# Patient Record
Sex: Female | Born: 1960
Health system: Southern US, Community
[De-identification: ages and names within clinical notes are randomized; demographics above are authoritative.]

## PROBLEM LIST (undated history)

## (undated) DIAGNOSIS — D354 Benign neoplasm of pineal gland: Secondary | ICD-10-CM

## (undated) DIAGNOSIS — I10 Essential (primary) hypertension: Secondary | ICD-10-CM

## (undated) DIAGNOSIS — J189 Pneumonia, unspecified organism: Secondary | ICD-10-CM

## (undated) DIAGNOSIS — N938 Other specified abnormal uterine and vaginal bleeding: Secondary | ICD-10-CM

## (undated) DIAGNOSIS — Z01419 Encounter for gynecological examination (general) (routine) without abnormal findings: Secondary | ICD-10-CM

## (undated) DIAGNOSIS — K219 Gastro-esophageal reflux disease without esophagitis: Secondary | ICD-10-CM

## (undated) DIAGNOSIS — Z9289 Personal history of other medical treatment: Secondary | ICD-10-CM

## (undated) HISTORY — PX: WISDOM TOOTH EXTRACTION: SHX21

## (undated) HISTORY — PX: TONSILLECTOMY: SUR1361

## (undated) HISTORY — PX: MYOMECTOMY ABDOMINAL APPROACH: SUR870

## (undated) HISTORY — DX: Pneumonia, unspecified organism: J18.9

## (undated) HISTORY — PX: EUSTACHIAN TUBE DILATION: SHX6770

## (undated) HISTORY — DX: Essential (primary) hypertension: I10

## (undated) HISTORY — DX: Gastro-esophageal reflux disease without esophagitis: K21.9

## (undated) HISTORY — PX: DILATION AND CURETTAGE OF UTERUS: SHX78

## (undated) HISTORY — PX: ABDOMINAL HYSTERECTOMY: SHX81

## (undated) HISTORY — DX: Other specified abnormal uterine and vaginal bleeding: N93.8

## (undated) HISTORY — PX: ANTERIOR CRUCIATE LIGAMENT REPAIR: SHX115

## (undated) HISTORY — DX: Benign neoplasm of pineal gland: D35.4

## (undated) HISTORY — DX: Encounter for gynecological examination (general) (routine) without abnormal findings: Z01.419

## (undated) HISTORY — DX: Personal history of other medical treatment: Z92.89

---

## 1997-08-08 ENCOUNTER — Other Ambulatory Visit: Admission: RE | Admit: 1997-08-08 | Discharge: 1997-08-08 | Payer: Self-pay | Admitting: Obstetrics and Gynecology

## 1998-08-09 ENCOUNTER — Other Ambulatory Visit: Admission: RE | Admit: 1998-08-09 | Discharge: 1998-08-09 | Payer: Self-pay | Admitting: Obstetrics and Gynecology

## 1998-09-07 ENCOUNTER — Ambulatory Visit (HOSPITAL_COMMUNITY): Admission: RE | Admit: 1998-09-07 | Discharge: 1998-09-07 | Payer: Self-pay | Admitting: Obstetrics and Gynecology

## 1998-09-07 ENCOUNTER — Encounter: Payer: Self-pay | Admitting: Obstetrics and Gynecology

## 1998-09-13 ENCOUNTER — Encounter: Payer: Self-pay | Admitting: Obstetrics and Gynecology

## 1998-09-13 ENCOUNTER — Ambulatory Visit (HOSPITAL_COMMUNITY): Admission: RE | Admit: 1998-09-13 | Discharge: 1998-09-13 | Payer: Self-pay | Admitting: Obstetrics and Gynecology

## 1999-08-12 ENCOUNTER — Other Ambulatory Visit: Admission: RE | Admit: 1999-08-12 | Discharge: 1999-08-12 | Payer: Self-pay | Admitting: Obstetrics and Gynecology

## 1999-09-13 ENCOUNTER — Ambulatory Visit (HOSPITAL_COMMUNITY): Admission: RE | Admit: 1999-09-13 | Discharge: 1999-09-13 | Payer: Self-pay | Admitting: Obstetrics and Gynecology

## 1999-09-13 ENCOUNTER — Encounter: Payer: Self-pay | Admitting: Obstetrics and Gynecology

## 2000-01-06 ENCOUNTER — Ambulatory Visit (HOSPITAL_COMMUNITY): Admission: RE | Admit: 2000-01-06 | Discharge: 2000-01-06 | Payer: Self-pay | Admitting: Gastroenterology

## 2000-01-06 ENCOUNTER — Encounter: Payer: Self-pay | Admitting: Gastroenterology

## 2000-10-05 ENCOUNTER — Ambulatory Visit (HOSPITAL_COMMUNITY): Admission: RE | Admit: 2000-10-05 | Discharge: 2000-10-05 | Payer: Self-pay | Admitting: Obstetrics and Gynecology

## 2000-10-05 ENCOUNTER — Encounter: Payer: Self-pay | Admitting: Obstetrics and Gynecology

## 2000-10-12 ENCOUNTER — Other Ambulatory Visit: Admission: RE | Admit: 2000-10-12 | Discharge: 2000-10-12 | Payer: Self-pay | Admitting: Obstetrics and Gynecology

## 2001-09-14 ENCOUNTER — Other Ambulatory Visit: Admission: RE | Admit: 2001-09-14 | Discharge: 2001-09-14 | Payer: Self-pay | Admitting: Obstetrics and Gynecology

## 2001-10-19 ENCOUNTER — Emergency Department (HOSPITAL_COMMUNITY): Admission: EM | Admit: 2001-10-19 | Discharge: 2001-10-20 | Payer: Self-pay | Admitting: *Deleted

## 2001-10-20 ENCOUNTER — Encounter: Payer: Self-pay | Admitting: Emergency Medicine

## 2001-10-26 ENCOUNTER — Encounter: Payer: Self-pay | Admitting: Family Medicine

## 2001-10-26 ENCOUNTER — Encounter: Admission: RE | Admit: 2001-10-26 | Discharge: 2001-10-26 | Payer: Self-pay | Admitting: Family Medicine

## 2002-03-10 ENCOUNTER — Encounter: Payer: Self-pay | Admitting: Family Medicine

## 2002-08-09 ENCOUNTER — Encounter: Payer: Self-pay | Admitting: Obstetrics and Gynecology

## 2002-08-09 ENCOUNTER — Ambulatory Visit (HOSPITAL_COMMUNITY): Admission: RE | Admit: 2002-08-09 | Discharge: 2002-08-09 | Payer: Self-pay | Admitting: Obstetrics and Gynecology

## 2002-09-29 ENCOUNTER — Other Ambulatory Visit: Admission: RE | Admit: 2002-09-29 | Discharge: 2002-09-29 | Payer: Self-pay | Admitting: Obstetrics and Gynecology

## 2002-10-31 ENCOUNTER — Encounter: Payer: Self-pay | Admitting: Family Medicine

## 2002-10-31 ENCOUNTER — Encounter: Admission: RE | Admit: 2002-10-31 | Discharge: 2002-10-31 | Payer: Self-pay | Admitting: Family Medicine

## 2003-08-29 ENCOUNTER — Ambulatory Visit (HOSPITAL_COMMUNITY): Admission: RE | Admit: 2003-08-29 | Discharge: 2003-08-29 | Payer: Self-pay | Admitting: Obstetrics and Gynecology

## 2003-12-07 ENCOUNTER — Other Ambulatory Visit: Admission: RE | Admit: 2003-12-07 | Discharge: 2003-12-07 | Payer: Self-pay | Admitting: Obstetrics and Gynecology

## 2004-09-06 ENCOUNTER — Ambulatory Visit: Payer: Self-pay | Admitting: Family Medicine

## 2004-09-13 ENCOUNTER — Ambulatory Visit: Payer: Self-pay | Admitting: Family Medicine

## 2004-09-19 ENCOUNTER — Encounter: Admission: RE | Admit: 2004-09-19 | Discharge: 2004-09-19 | Payer: Self-pay | Admitting: Family Medicine

## 2004-10-30 ENCOUNTER — Ambulatory Visit (HOSPITAL_COMMUNITY): Admission: RE | Admit: 2004-10-30 | Discharge: 2004-10-30 | Payer: Self-pay | Admitting: Obstetrics and Gynecology

## 2004-12-23 ENCOUNTER — Other Ambulatory Visit: Admission: RE | Admit: 2004-12-23 | Discharge: 2004-12-23 | Payer: Self-pay | Admitting: Obstetrics and Gynecology

## 2005-01-29 ENCOUNTER — Encounter (INDEPENDENT_AMBULATORY_CARE_PROVIDER_SITE_OTHER): Payer: Self-pay | Admitting: Specialist

## 2005-01-29 ENCOUNTER — Ambulatory Visit (HOSPITAL_COMMUNITY): Admission: RE | Admit: 2005-01-29 | Discharge: 2005-01-29 | Payer: Self-pay | Admitting: Obstetrics and Gynecology

## 2005-06-17 ENCOUNTER — Ambulatory Visit: Payer: Self-pay | Admitting: Internal Medicine

## 2005-09-12 ENCOUNTER — Ambulatory Visit: Payer: Self-pay | Admitting: Family Medicine

## 2005-09-18 ENCOUNTER — Ambulatory Visit: Payer: Self-pay | Admitting: Family Medicine

## 2005-11-18 ENCOUNTER — Ambulatory Visit (HOSPITAL_COMMUNITY): Admission: RE | Admit: 2005-11-18 | Discharge: 2005-11-18 | Payer: Self-pay | Admitting: Obstetrics and Gynecology

## 2005-12-23 ENCOUNTER — Other Ambulatory Visit: Admission: RE | Admit: 2005-12-23 | Discharge: 2005-12-23 | Payer: Self-pay | Admitting: Obstetrics and Gynecology

## 2006-01-22 ENCOUNTER — Encounter (INDEPENDENT_AMBULATORY_CARE_PROVIDER_SITE_OTHER): Payer: Self-pay | Admitting: *Deleted

## 2006-01-23 ENCOUNTER — Inpatient Hospital Stay (HOSPITAL_COMMUNITY): Admission: RE | Admit: 2006-01-23 | Discharge: 2006-01-24 | Payer: Self-pay | Admitting: Obstetrics and Gynecology

## 2006-03-10 ENCOUNTER — Ambulatory Visit: Payer: Self-pay | Admitting: Family Medicine

## 2006-03-13 ENCOUNTER — Ambulatory Visit: Payer: Self-pay | Admitting: Family Medicine

## 2006-05-27 ENCOUNTER — Ambulatory Visit: Payer: Self-pay | Admitting: Family Medicine

## 2006-10-02 ENCOUNTER — Ambulatory Visit: Payer: Self-pay | Admitting: Family Medicine

## 2006-10-02 LAB — CONVERTED CEMR LAB
Bilirubin Urine: NEGATIVE
Nitrite: NEGATIVE
Protein, U semiquant: NEGATIVE
Urobilinogen, UA: NEGATIVE
WBC Urine, dipstick: NEGATIVE

## 2006-10-08 DIAGNOSIS — Z8709 Personal history of other diseases of the respiratory system: Secondary | ICD-10-CM | POA: Insufficient documentation

## 2006-10-08 DIAGNOSIS — K219 Gastro-esophageal reflux disease without esophagitis: Secondary | ICD-10-CM | POA: Insufficient documentation

## 2006-10-12 ENCOUNTER — Ambulatory Visit: Payer: Self-pay | Admitting: Family Medicine

## 2006-10-12 DIAGNOSIS — I10 Essential (primary) hypertension: Secondary | ICD-10-CM | POA: Insufficient documentation

## 2006-10-12 DIAGNOSIS — J309 Allergic rhinitis, unspecified: Secondary | ICD-10-CM | POA: Insufficient documentation

## 2006-10-12 LAB — CONVERTED CEMR LAB
AST: 18 units/L (ref 0–37)
Albumin: 3.6 g/dL (ref 3.5–5.2)
Alkaline Phosphatase: 63 units/L (ref 39–117)
Basophils Absolute: 0 10*3/uL (ref 0.0–0.1)
Basophils Relative: 0.3 % (ref 0.0–1.0)
CO2: 29 meq/L (ref 19–32)
Chloride: 101 meq/L (ref 96–112)
Creatinine, Ser: 0.7 mg/dL (ref 0.4–1.2)
HCT: 40.4 % (ref 36.0–46.0)
Hemoglobin: 13.8 g/dL (ref 12.0–15.0)
LDL Cholesterol: 104 mg/dL — ABNORMAL HIGH (ref 0–99)
MCHC: 34.2 g/dL (ref 30.0–36.0)
Monocytes Absolute: 0.6 10*3/uL (ref 0.2–0.7)
Neutrophils Relative %: 70.5 % (ref 43.0–77.0)
Potassium: 3.8 meq/L (ref 3.5–5.1)
RBC: 4.52 M/uL (ref 3.87–5.11)
RDW: 13 % (ref 11.5–14.6)
Sodium: 138 meq/L (ref 135–145)
TSH: 0.92 microintl units/mL (ref 0.35–5.50)
Total Bilirubin: 1.6 mg/dL — ABNORMAL HIGH (ref 0.3–1.2)
Total CHOL/HDL Ratio: 3.5
Total Protein: 6.9 g/dL (ref 6.0–8.3)
VLDL: 17 mg/dL (ref 0–40)
WBC: 8.9 10*3/uL (ref 4.5–10.5)

## 2006-11-20 ENCOUNTER — Encounter: Payer: Self-pay | Admitting: Family Medicine

## 2006-11-20 ENCOUNTER — Telehealth: Payer: Self-pay | Admitting: Family Medicine

## 2006-11-23 ENCOUNTER — Ambulatory Visit (HOSPITAL_COMMUNITY): Admission: RE | Admit: 2006-11-23 | Discharge: 2006-11-23 | Payer: Self-pay | Admitting: Obstetrics and Gynecology

## 2006-12-28 ENCOUNTER — Other Ambulatory Visit: Admission: RE | Admit: 2006-12-28 | Discharge: 2006-12-28 | Payer: Self-pay | Admitting: Obstetrics and Gynecology

## 2007-09-07 ENCOUNTER — Telehealth: Payer: Self-pay | Admitting: Family Medicine

## 2007-09-10 ENCOUNTER — Telehealth: Payer: Self-pay | Admitting: Family Medicine

## 2007-10-05 ENCOUNTER — Ambulatory Visit: Payer: Self-pay | Admitting: Family Medicine

## 2007-10-05 LAB — CONVERTED CEMR LAB
Blood in Urine, dipstick: NEGATIVE
Nitrite: NEGATIVE
Protein, U semiquant: NEGATIVE
WBC Urine, dipstick: NEGATIVE

## 2007-10-08 LAB — CONVERTED CEMR LAB
ALT: 15 units/L (ref 0–35)
AST: 17 units/L (ref 0–37)
Albumin: 3.4 g/dL — ABNORMAL LOW (ref 3.5–5.2)
Alkaline Phosphatase: 48 units/L (ref 39–117)
BUN: 10 mg/dL (ref 6–23)
Basophils Absolute: 0 10*3/uL (ref 0.0–0.1)
Basophils Relative: 0.1 % (ref 0.0–3.0)
Bilirubin, Direct: 0.1 mg/dL (ref 0.0–0.3)
CO2: 31 meq/L (ref 19–32)
Calcium: 8.9 mg/dL (ref 8.4–10.5)
Chloride: 108 meq/L (ref 96–112)
Cholesterol: 147 mg/dL (ref 0–200)
Creatinine, Ser: 0.7 mg/dL (ref 0.4–1.2)
Eosinophils Absolute: 0.2 10*3/uL (ref 0.0–0.7)
Eosinophils Relative: 2.9 % (ref 0.0–5.0)
GFR calc Af Amer: 117 mL/min
GFR calc non Af Amer: 97 mL/min
Glucose, Bld: 94 mg/dL (ref 70–99)
HCT: 40.7 % (ref 36.0–46.0)
HDL: 43.7 mg/dL (ref >39.0)
Hemoglobin: 13.8 g/dL (ref 12.0–15.0)
LDL Cholesterol: 84 mg/dL (ref 0–99)
Lymphocytes Relative: 25.7 % (ref 12.0–46.0)
MCHC: 33.9 g/dL (ref 30.0–36.0)
MCV: 92.3 fL (ref 78.0–100.0)
Monocytes Absolute: 0.5 10*3/uL (ref 0.1–1.0)
Monocytes Relative: 7.4 % (ref 3.0–12.0)
Neutro Abs: 4.4 10*3/uL (ref 1.4–7.7)
Neutrophils Relative %: 63.9 % (ref 43.0–77.0)
Platelets: 281 10*3/uL (ref 150–400)
Potassium: 4.1 meq/L (ref 3.5–5.1)
RBC: 4.4 M/uL (ref 3.87–5.11)
RDW: 12.3 % (ref 11.5–14.6)
Sodium: 143 meq/L (ref 135–145)
TSH: 0.89 microintl units/mL (ref 0.35–5.50)
Total Bilirubin: 0.9 mg/dL (ref 0.3–1.2)
Total CHOL/HDL Ratio: 3.4
Total Protein: 6.3 g/dL (ref 6.0–8.3)
Triglycerides: 96 mg/dL (ref 0–149)
VLDL: 19 mg/dL (ref 0–40)
WBC: 6.9 10*3/uL (ref 4.5–10.5)

## 2007-10-13 ENCOUNTER — Ambulatory Visit: Payer: Self-pay | Admitting: Family Medicine

## 2007-10-20 ENCOUNTER — Telehealth: Payer: Self-pay | Admitting: Family Medicine

## 2007-11-26 ENCOUNTER — Ambulatory Visit (HOSPITAL_COMMUNITY): Admission: RE | Admit: 2007-11-26 | Discharge: 2007-11-26 | Payer: Self-pay | Admitting: Gynecology

## 2007-12-17 ENCOUNTER — Telehealth: Payer: Self-pay | Admitting: Family Medicine

## 2008-01-05 ENCOUNTER — Encounter: Payer: Self-pay | Admitting: Gynecology

## 2008-01-05 ENCOUNTER — Other Ambulatory Visit: Admission: RE | Admit: 2008-01-05 | Discharge: 2008-01-05 | Payer: Self-pay | Admitting: Gynecology

## 2008-01-05 ENCOUNTER — Ambulatory Visit: Payer: Self-pay | Admitting: Gynecology

## 2008-01-22 ENCOUNTER — Ambulatory Visit: Payer: Self-pay | Admitting: Family Medicine

## 2008-01-22 DIAGNOSIS — J45909 Unspecified asthma, uncomplicated: Secondary | ICD-10-CM | POA: Insufficient documentation

## 2008-03-09 ENCOUNTER — Telehealth: Payer: Self-pay | Admitting: Family Medicine

## 2008-08-21 ENCOUNTER — Encounter: Payer: Self-pay | Admitting: Family Medicine

## 2008-09-18 ENCOUNTER — Encounter: Payer: Self-pay | Admitting: Family Medicine

## 2008-10-06 ENCOUNTER — Ambulatory Visit: Payer: Self-pay | Admitting: Family Medicine

## 2008-10-06 LAB — CONVERTED CEMR LAB
Ketones, urine, test strip: NEGATIVE
Nitrite: NEGATIVE
Urobilinogen, UA: 0.2
WBC Urine, dipstick: NEGATIVE

## 2008-10-09 LAB — CONVERTED CEMR LAB
ALT: 17 units/L (ref 0–35)
Alkaline Phosphatase: 60 units/L (ref 39–117)
Basophils Relative: 0.3 % (ref 0.0–3.0)
Bilirubin, Direct: 0 mg/dL (ref 0.0–0.3)
Calcium: 8.9 mg/dL (ref 8.4–10.5)
Chloride: 112 meq/L (ref 96–112)
Creatinine, Ser: 0.8 mg/dL (ref 0.4–1.2)
Eosinophils Relative: 2.6 % (ref 0.0–5.0)
HDL: 42.4 mg/dL (ref >39.00)
LDL Cholesterol: 100 mg/dL — ABNORMAL HIGH (ref 0–99)
Lymphocytes Relative: 26.8 % (ref 12.0–46.0)
Neutrophils Relative %: 61.7 % (ref 43.0–77.0)
RBC: 4.53 M/uL (ref 3.87–5.11)
Total CHOL/HDL Ratio: 4
Total Protein: 6.4 g/dL (ref 6.0–8.3)
Triglycerides: 66 mg/dL (ref 0.0–149.0)
WBC: 6.7 10*3/uL (ref 4.5–10.5)

## 2008-10-13 ENCOUNTER — Telehealth: Payer: Self-pay | Admitting: Family Medicine

## 2008-10-27 ENCOUNTER — Ambulatory Visit: Payer: Self-pay | Admitting: Family Medicine

## 2008-12-14 ENCOUNTER — Telehealth: Payer: Self-pay | Admitting: Family Medicine

## 2008-12-15 ENCOUNTER — Ambulatory Visit (HOSPITAL_COMMUNITY): Admission: RE | Admit: 2008-12-15 | Discharge: 2008-12-15 | Payer: Self-pay | Admitting: Gynecology

## 2009-01-08 ENCOUNTER — Ambulatory Visit: Payer: Self-pay | Admitting: Gynecology

## 2009-01-08 ENCOUNTER — Other Ambulatory Visit: Admission: RE | Admit: 2009-01-08 | Discharge: 2009-01-08 | Payer: Self-pay | Admitting: Gynecology

## 2009-02-08 ENCOUNTER — Encounter: Payer: Self-pay | Admitting: Family Medicine

## 2009-03-26 ENCOUNTER — Telehealth: Payer: Self-pay | Admitting: Family Medicine

## 2009-05-07 ENCOUNTER — Encounter: Payer: Self-pay | Admitting: Family Medicine

## 2009-06-18 ENCOUNTER — Ambulatory Visit: Payer: Self-pay | Admitting: Family Medicine

## 2009-07-05 ENCOUNTER — Ambulatory Visit: Payer: Self-pay | Admitting: Internal Medicine

## 2009-07-05 DIAGNOSIS — R509 Fever, unspecified: Secondary | ICD-10-CM | POA: Insufficient documentation

## 2009-07-05 DIAGNOSIS — J029 Acute pharyngitis, unspecified: Secondary | ICD-10-CM | POA: Insufficient documentation

## 2009-07-06 ENCOUNTER — Telehealth: Payer: Self-pay | Admitting: Internal Medicine

## 2009-07-13 ENCOUNTER — Ambulatory Visit: Payer: Self-pay | Admitting: Family Medicine

## 2009-07-13 DIAGNOSIS — K5289 Other specified noninfective gastroenteritis and colitis: Secondary | ICD-10-CM | POA: Insufficient documentation

## 2009-09-24 ENCOUNTER — Telehealth: Payer: Self-pay | Admitting: Family Medicine

## 2009-09-24 DIAGNOSIS — D354 Benign neoplasm of pineal gland: Secondary | ICD-10-CM | POA: Insufficient documentation

## 2009-10-10 ENCOUNTER — Encounter: Admission: RE | Admit: 2009-10-10 | Discharge: 2009-10-10 | Payer: Self-pay | Admitting: Family Medicine

## 2009-11-01 ENCOUNTER — Ambulatory Visit: Payer: Self-pay | Admitting: Family Medicine

## 2009-11-01 LAB — CONVERTED CEMR LAB
Blood in Urine, dipstick: NEGATIVE
Nitrite: NEGATIVE
Protein, U semiquant: NEGATIVE
WBC Urine, dipstick: NEGATIVE
pH: 5.5

## 2009-11-05 LAB — CONVERTED CEMR LAB
ALT: 16 units/L (ref 0–35)
Albumin: 3.5 g/dL (ref 3.5–5.2)
Basophils Relative: 0.9 % (ref 0.0–3.0)
Calcium: 9.2 mg/dL (ref 8.4–10.5)
Creatinine, Ser: 0.7 mg/dL (ref 0.4–1.2)
Eosinophils Absolute: 0.2 10*3/uL (ref 0.0–0.7)
Eosinophils Relative: 2.6 % (ref 0.0–5.0)
GFR calc non Af Amer: 90.8 mL/min (ref >60)
Glucose, Bld: 87 mg/dL (ref 70–99)
HDL: 50.9 mg/dL (ref >39.00)
Hemoglobin: 12.6 g/dL (ref 12.0–15.0)
Lymphs Abs: 1.7 10*3/uL (ref 0.7–4.0)
Monocytes Absolute: 0.5 10*3/uL (ref 0.1–1.0)
Platelets: 255 10*3/uL (ref 150.0–400.0)
RDW: 13.5 % (ref 11.5–14.6)
Sodium: 141 meq/L (ref 135–145)
TSH: 1 microintl units/mL (ref 0.35–5.50)
Total Protein: 6.1 g/dL (ref 6.0–8.3)
VLDL: 20.8 mg/dL (ref 0.0–40.0)

## 2009-11-08 ENCOUNTER — Ambulatory Visit: Payer: Self-pay | Admitting: Family Medicine

## 2009-11-24 ENCOUNTER — Ambulatory Visit: Payer: Self-pay | Admitting: Internal Medicine

## 2010-01-01 ENCOUNTER — Ambulatory Visit (HOSPITAL_COMMUNITY)
Admission: RE | Admit: 2010-01-01 | Discharge: 2010-01-01 | Payer: Self-pay | Source: Home / Self Care | Admitting: Gynecology

## 2010-01-10 ENCOUNTER — Ambulatory Visit: Payer: Self-pay | Admitting: Gynecology

## 2010-01-10 ENCOUNTER — Other Ambulatory Visit
Admission: RE | Admit: 2010-01-10 | Discharge: 2010-01-10 | Payer: Self-pay | Source: Home / Self Care | Admitting: Gynecology

## 2010-02-11 ENCOUNTER — Encounter: Payer: Self-pay | Admitting: Family Medicine

## 2010-02-15 ENCOUNTER — Telehealth: Payer: Self-pay | Admitting: Family Medicine

## 2010-02-26 NOTE — Progress Notes (Signed)
  Phone Note Call from Patient   Caller: Patient Call For: Nelwyn Salisbury MD Summary of Call: Has questions about her ongoing illness.  We discussed drinking electrolyte drinks, monitoring fever, possibilty of using Immodium if diarrhea does not subside.  Slowly increase diet.  She is feeling some better, but is still having diarrhea today and a temp of 100.  She is aware of warning [phone] Initial call taken by: Lynann Beaver CMA,  July 06, 2009 1:05 PM  Follow-up for Phone Call        agree with above . if diarrhea continueing would consider stool culture tests.  Follow-up by: Madelin Headings MD,  July 06, 2009 1:20 PM

## 2010-02-26 NOTE — Progress Notes (Signed)
Summary: wants test  Phone Note Call from Patient Call back at [phone]   Caller: Patient Call For: Nelwyn Salisbury MD Summary of Call: has CPX scheduled in October- says it's time for her 8yr f/u MRI brain and wants to get this done before her CPX. Initial call taken by: Raechel Ache, RN,  September 24, 2009 1:43 PM  Follow-up for Phone Call        I agree. 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

## 2010-02-26 NOTE — Assessment & Plan Note (Signed)
Summary: continued GI problems/dm   Vital Signs:  Patient profile:   50 year old female Weight:      169 pounds BMI:     27.38 Temp:     98.4 degrees F oral BP sitting:   136 / 90  (left arm) Cuff size:   regular  Vitals Entered By: Raechel Ache, RN (July 13, 2009 4:29 PM) CC: Saw Dr Fabian Sharp last week for ?food poisoning and still having diarrhea and cramps. Also dry cough. Leaving for vacation tomorrow.   History of Present Illness: Here for 2 weeks of continuing problems of upper abdominal cramps and diarrhea. This started with fever and nausea as well, but these subsided after several days. No recent international travel. No recent antibiotic use. No one else in her family is sick. She was seen here on 07-05-09, and this was felt to be a viral gastroenteritis. The symptoms did improve a bit for a few days, but now the cramps and diarrhea have been back for about a week. She is able to eat light foods and drink fluids. Imodium helps at times.   Allergies: 1)  ! Sulfa 2)  ! Compazine 3)  ! Macrobid  Past History:  Past Medical History: Reviewed history from 10/27/2008 and no changes required. GERD Pneumonia, hx of Allergic rhinitis, sees Dr. Corinda Gubler benign pineal gland cyst, last MRI 09-19-04 (5 yr follow up planned) Hypertension Dysfunctional uterine bleeding, sees Dr. Lily Peer Asthma sees Dr. Karlyn Agee for skin checks  Past Surgical History: Reviewed history from 10/27/2008 and no changes required. Tonsillectomy D and C twice Wisdom Teeth extraction Hysterectomy right knee ACL reconstruction with medial and lateral meniscectomies 09-04-08 per Dr. Teryl Lucy  Review of Systems  The patient denies anorexia, fever, weight loss, weight gain, vision loss, decreased hearing, hoarseness, chest pain, syncope, dyspnea on exertion, peripheral edema, prolonged cough, headaches, hemoptysis, melena, hematochezia, severe indigestion/heartburn, hematuria, incontinence, genital  sores, muscle weakness, suspicious skin lesions, transient blindness, difficulty walking, depression, unusual weight change, abnormal bleeding, enlarged lymph nodes, angioedema, breast masses, and testicular masses.    Physical Exam  General:  Well-developed,well-nourished,in no acute distress; alert,appropriate and cooperative throughout examination Eyes:  no jaundice Neck:  No deformities, masses, or tenderness noted. Lungs:  Normal respiratory effort, chest expands symmetrically. Lungs are clear to auscultation, no crackles or wheezes. Heart:  Normal rate and regular rhythm. S1 and S2 normal without gallop, murmur, click, rub or other extra sounds. Abdomen:  soft, normal bowel sounds, no distention, no masses, no guarding, no rigidity, no rebound tenderness, no abdominal hernia, no inguinal hernia, no hepatomegaly, and no splenomegaly.  Mild tenderness in both upper quadrants.    Impression & Recommendations:  Problem # 1:  GASTROENTERITIS (ICD-558.9)  Complete Medication List: 1)  Nexium 40 Mg Cpdr (Esomeprazole magnesium) .... Once daily 2)  Zyrtec Allergy 10 Mg Tabs (Cetirizine hcl) 3)  Flonase 50 Mcg/act Susp (Fluticasone propionate) .... 2 sprays once daily each nostril 4)  Toprol Xl 25 Mg Tb24 (Metoprolol succinate) .... Once daily 5)  K-tabs 10 Meq Tbcr (Potassium chloride) .... Once daily 6)  Advil 200 Mg Caps (Ibuprofen) .... Once daily as needed 7)  Ciprofloxacin Hcl 500 Mg Tabs (Ciprofloxacin hcl) .... Two times a day  Patient Instructions: 1)  She leaves in the morning for a [place] at the beach, so she does not want to start any sort of a workup for this today. We decided to cover for any bacterial infection she may be  harboring with a course of Cipro. She can use Imodium as needed . If she is not better by the time she gets back from vacation, she will see me and we will work this up in more detail.  Prescriptions: CIPROFLOXACIN HCL 500 MG TABS (CIPROFLOXACIN HCL) two times  a day  #20 x 0   Entered and Authorized by:   Nelwyn Salisbury MD   Signed by:   Nelwyn Salisbury MD on 07/13/2009   Method used:   Electronically to        [facility]  [phone]* (retail)       [address]       [place], [place]  [address]       Ph: [phone] or [phone]       Fax: [phone]   RxID:   [id number]

## 2010-02-26 NOTE — Progress Notes (Signed)
Summary: [facility]  Phone Note From Pharmacy   Caller: [facility] Call For: Clent Ridges  Summary of Call: have a script for Toprol dated 10/13/07- want to know if it can be renewed? ph [phone] JYN#[phone] Initial call taken by: Raechel Ache, RN,  March 26, 2009 4:21 PM  Follow-up for Phone Call        Phone call completed, Pharmacist called Follow-up by: Alfred Levins, CMA,  March 26, 2009 4:27 PM    Prescriptions: TOPROL XL 25 MG  TB24 (METOPROLOL SUCCINATE) once daily  #90 x 3   Entered by:   Alfred Levins, CMA   Authorized by:   Nelwyn Salisbury MD   Signed by:   Alfred Levins, CMA on 03/26/2009   Method used:   Electronically to        [facility]* (mail-order)             ,          Ph: [phone]       Fax: [phone]   RxID:   [id number]

## 2010-02-26 NOTE — Letter (Signed)
Summary: [facility]  [facility]   Imported By: [name] 05/11/2009 13:22:22  _____________________________________________________________________  External Attachment:    Type:   Image     Comment:   External Document

## 2010-02-26 NOTE — Assessment & Plan Note (Signed)
Summary: CPX // RS   Vital Signs:  Patient profile:   50 year old female Height:      66 inches Weight:      177 pounds O2 Sat:      95 % Temp:     98.6 degrees F Pulse rate:   63 / minute BP sitting:   130 / 82  (left arm)  Vitals Entered By: Pura Spice, RN (November 08, 2009 10:22 AM)  Contraindications/Deferment of Procedures/Staging:    Test/Procedure: FLU VAX    Reason for deferment: patient declined  CC: cpx no pap Is Patient Diabetic? No   History of Present Illness: 50 yr old female for a cpx. She feels fine with no concerns.   Allergies: 1)  ! Sulfa 2)  ! Compazine 3)  ! Macrobid  Past History:  Past Medical History: Reviewed history from 10/27/2008 and no changes required. GERD Pneumonia, hx of Allergic rhinitis, sees Dr. Corinda Gubler benign pineal gland cyst, last MRI 09-19-04 (5 yr follow up planned) Hypertension Dysfunctional uterine bleeding, sees Dr. Lily Peer Asthma sees Dr. Karlyn Agee for skin checks  Past Surgical History: Reviewed history from 10/27/2008 and no changes required. Tonsillectomy D and C twice Wisdom Teeth extraction Hysterectomy right knee ACL reconstruction with medial and lateral meniscectomies 09-04-08 per Dr. Teryl Lucy  Past History:  Care Management: Gynecology: Dr Mickel Duhamel  Family History: Reviewed history from 10/08/2006 and no changes required. Family History of Bowel disease Family History Diabetes 1st degree relative Family History Hypertension Family History Thyroid disease Family History of Cardiovascular disorder  Social History: Reviewed history from 10/08/2006 and no changes required. Occupation: Married Never Smoked Alcohol use-yes  Review of Systems  The patient denies anorexia, fever, weight loss, weight gain, vision loss, decreased hearing, hoarseness, chest pain, syncope, dyspnea on exertion, peripheral edema, prolonged cough, headaches, hemoptysis, abdominal pain, melena, hematochezia,  severe indigestion/heartburn, hematuria, incontinence, genital sores, muscle weakness, suspicious skin lesions, transient blindness, difficulty walking, depression, unusual weight change, abnormal bleeding, enlarged lymph nodes, angioedema, breast masses, and testicular masses.    Physical Exam  General:  Well-developed,well-nourished,in no acute distress; alert,appropriate and cooperative throughout examination Head:  Normocephalic and atraumatic without obvious abnormalities. No apparent alopecia or balding. Eyes:  No corneal or conjunctival inflammation noted. EOMI. Perrla. Funduscopic exam benign, without hemorrhages, exudates or papilledema. Vision grossly normal. Ears:  External ear exam shows no significant lesions or deformities.  Otoscopic examination reveals clear canals, tympanic membranes are intact bilaterally without bulging, retraction, inflammation or discharge. Hearing is grossly normal bilaterally. Nose:  External nasal examination shows no deformity or inflammation. Nasal mucosa are pink and moist without lesions or exudates. Mouth:  Oral mucosa and oropharynx without lesions or exudates.  Teeth in good repair. Neck:  No deformities, masses, or tenderness noted. Chest Wall:  No deformities, masses, or tenderness noted. Lungs:  Normal respiratory effort, chest expands symmetrically. Lungs are clear to auscultation, no crackles or wheezes. Heart:  Normal rate and regular rhythm. S1 and S2 normal without gallop, murmur, click, rub or other extra sounds. Abdomen:  Bowel sounds positive,abdomen soft and non-tender without masses, organomegaly or hernias noted. Msk:  No deformity or scoliosis noted of thoracic or lumbar spine.   Pulses:  R and L carotid,radial,femoral,dorsalis pedis and posterior tibial pulses are full and equal bilaterally Extremities:  No clubbing, cyanosis, edema, or deformity noted with normal full range of motion of all joints.   Neurologic:  No cranial nerve  deficits noted. Station and  gait are normal. Plantar reflexes are down-going bilaterally. DTRs are symmetrical throughout. Sensory, motor and coordinative functions appear intact. Skin:  Intact without suspicious lesions or rashes Cervical Nodes:  No lymphadenopathy noted Axillary Nodes:  No palpable lymphadenopathy Inguinal Nodes:  No significant adenopathy Psych:  Cognition and judgment appear intact. Alert and cooperative with normal attention span and concentration. No apparent delusions, illusions, hallucinations   Impression & Recommendations:  Problem # 1:  EXAMINATION, ROUTINE MEDICAL (ICD-V70.0)  Complete Medication List: 1)  Nexium 40 Mg Cpdr (Esomeprazole magnesium) .... Once daily 2)  Zyrtec Allergy 10 Mg Tabs (Cetirizine hcl) 3)  Flonase 50 Mcg/act Susp (Fluticasone propionate) .... 2 sprays once daily each nostril 4)  Toprol Xl 25 Mg Tb24 (Metoprolol succinate) .... Once daily 5)  K-tabs 10 Meq Tbcr (Potassium chloride) .... Once daily 6)  Advil 200 Mg Caps (Ibuprofen) .... Once daily as needed  Patient Instructions: 1)  Please schedule a follow-up appointment in 6 months .  Prescriptions: K-TABS 10 MEQ  TBCR (POTASSIUM CHLORIDE) once daily  #90 x 3   Entered and Authorized by:   Nelwyn Salisbury MD   Signed by:   Nelwyn Salisbury MD on 11/08/2009   Method used:   Print then Give to Patient   RxID:   [id number] TOPROL XL 25 MG  TB24 (METOPROLOL SUCCINATE) once daily  #90 x 3   Entered and Authorized by:   Nelwyn Salisbury MD   Signed by:   Nelwyn Salisbury MD on 11/08/2009   Method used:   Print then Give to Patient   RxID:   [id number] FLONASE 50 MCG/ACT  SUSP (FLUTICASONE PROPIONATE) 2 sprays once daily each nostril  #90 x 3   Entered and Authorized by:   Nelwyn Salisbury MD   Signed by:   Nelwyn Salisbury MD on 11/08/2009   Method used:   Print then Give to Patient   RxID:   [id number] NEXIUM 40 MG  CPDR (ESOMEPRAZOLE MAGNESIUM) once daily  #90 x 3   Entered  and Authorized by:   Nelwyn Salisbury MD   Signed by:   Nelwyn Salisbury MD on 11/08/2009   Method used:   Print then Give to Patient   RxID:   [id number]

## 2010-02-26 NOTE — Assessment & Plan Note (Signed)
Summary: ALLERGIES...AS.   Vital Signs:  Patient profile:   50 year old female Height:      66 inches Weight:      181 pounds O2 Sat:      98 % Temp:     98.2 degrees F oral Pulse rate:   60 / minute BP sitting:   132 / 90  (left arm) Cuff size:   regular  Vitals Entered By: Jarome Lamas (November 24, 2009 12:38 PM) CC: congestion/pb   History of Present Illness: At a conference this week, something triggered an acute allergy attack Got acute ear symptoms upon landing in plane  watery eyes, runny nose Has post nasal drip  Tried sudafed before flying but didn't help  some clear and now thick mucus this AM No fever or at most low grade  No sig cough No SOB or wheezing  using her zyrtec and flonase regularly on weekly immunotherapy  Current Medications (verified): 1)  Nexium 40 Mg  Cpdr (Esomeprazole Magnesium) .... Once Daily 2)  Zyrtec Allergy 10 Mg  Tabs (Cetirizine Hcl) 3)  Flonase 50 Mcg/act  Susp (Fluticasone Propionate) .... 2 Sprays Once Daily Each Nostril 4)  Toprol Xl 25 Mg  Tb24 (Metoprolol Succinate) .... Once Daily 5)  K-Tabs 10 Meq  Tbcr (Potassium Chloride) .... Once Daily 6)  Advil 200 Mg Caps (Ibuprofen) .... Once Daily As Needed  Allergies (verified): 1)  ! Sulfa 2)  ! Compazine 3)  ! Macrobid  Past History:  Past medical, surgical, family and social histories (including risk factors) reviewed for relevance to current acute and chronic problems.  Past Medical History: Reviewed history from 10/27/2008 and no changes required. GERD Pneumonia, hx of Allergic rhinitis, sees Dr. Corinda Gubler benign pineal gland cyst, last MRI 09-19-04 (5 yr follow up planned) Hypertension Dysfunctional uterine bleeding, sees Dr. Lily Peer Asthma sees Dr. Karlyn Agee for skin checks  Past Surgical History: Reviewed history from 10/27/2008 and no changes required. Tonsillectomy D and C twice Wisdom Teeth extraction Hysterectomy right knee ACL reconstruction with  medial and lateral meniscectomies 09-04-08 per Dr. Teryl Lucy  Family History: Reviewed history from 10/08/2006 and no changes required. Family History of Bowel disease Family History Diabetes 1st degree relative Family History Hypertension Family History Thyroid disease Family History of Cardiovascular disorder  Social History: Reviewed history from 10/08/2006 and no changes required. Occupation: Married Never Smoked Alcohol use-yes  Review of Systems       No nausea or diarrhea  Physical Exam  General:  alert.  NAD Head:  no sig maxillary or frontal tenderness but does feel maxillary pressure Ears:  TMs both retracted without inflammation Nose:  moderate swelling and inflammation Mouth:  no erythema and no exudates.   Neck:  supple, no masses, and no cervical lymphadenopathy.   Lungs:  normal respiratory effort, no intercostal retractions, no accessory muscle use, normal breath sounds, no crackles, and no wheezes.     Impression & Recommendations:  Problem # 1:  ALLERGIC RHINITIS (ICD-477.9) Assessment Deteriorated exposure at conference, then ear problems with flying tends to wind up with sinustis if not cleared  P: will try 5 days of prednisone  Her updated medication list for this problem includes:    Zyrtec Allergy 10 Mg Tabs (Cetirizine hcl)    Flonase 50 Mcg/act Susp (Fluticasone propionate) .Marland Kitchen... 2 sprays once daily each nostril  Complete Medication List: 1)  Nexium 40 Mg Cpdr (Esomeprazole magnesium) .... Once daily 2)  Zyrtec Allergy 10 Mg Tabs (  Cetirizine hcl) 3)  Flonase 50 Mcg/act Susp (Fluticasone propionate) .... 2 sprays once daily each nostril 4)  Toprol Xl 25 Mg Tb24 (Metoprolol succinate) .... Once daily 5)  K-tabs 10 Meq Tbcr (Potassium chloride) .... Once daily 6)  Advil 200 Mg Caps (Ibuprofen) .... Once daily as needed 7)  Prednisone 20 Mg Tabs (Prednisone) .... 2 tabs daily for 5 days for allergy congestion  Patient Instructions: 1)   Please take the prednisone as prescribed for the next 5 days 2)  You may also add loratadine 10mg   1-2 tabs daily to your regular zyrtec for the allergies 3)  Please schedule a follow-up appointment as needed .  Prescriptions: PREDNISONE 20 MG TABS (PREDNISONE) 2 tabs daily for 5 days for allergy congestion  #10 x 0   Entered and Authorized by:   Cindee Salt MD   Signed by:   Cindee Salt MD on 11/24/2009   Method used:   Electronically to        [facility] (retail)       [address]       [place], [place]  [address]       Ph: [phone]       Fax: [phone]   RxID:   [id number]    Orders Added: 1)  Est. Patient Level III [84132]

## 2010-02-26 NOTE — Assessment & Plan Note (Signed)
Summary: mylagias, fever up to 103, diarrhea/dm/onset last night   Vital Signs:  Patient profile:   50 year old female Weight:      175 pounds Temp:     101.3 degrees F oral Pulse rate:   88 / minute BP sitting:   150 / 80  (left arm) Cuff size:   regular  Vitals Entered By: Romualdo Bolk, CMA (AAMA) (July 05, 2009 10:53 AM) CC: Fever, body aches, some coughing, glands swollen, diarrhea, sinus pressure, vomiting that started on 6/8   History of Present Illness: [facility] comes in today  with sudden onset of   fever myalgias  and above.  vomited x 1  .   diarrhea this am watery no blood .  No rash   by mouth fluids ok.  Mild cough some mild ha no rash no tick bites . No uti signs   . No vomiting today and by mouth intake adequate .   Just traveled from meeting in [place].     No cp sob or wheezing . Had flu shot this year.   Preventive Screening-Counseling & Management  Alcohol-Tobacco     Smoking Status: never  Current Medications (verified): 1)  Nexium 40 Mg  Cpdr (Esomeprazole Magnesium) .... Once Daily 2)  Zyrtec Allergy 10 Mg  Tabs (Cetirizine Hcl) 3)  Flonase 50 Mcg/act  Susp (Fluticasone Propionate) .... 2 Sprays Once Daily Each Nostril 4)  Toprol Xl 25 Mg  Tb24 (Metoprolol Succinate) .... Once Daily 5)  K-Tabs 10 Meq  Tbcr (Potassium Chloride) .... Once Daily 6)  Advil 200 Mg Caps (Ibuprofen) .... Once Daily As Needed  Allergies (verified): 1)  ! Sulfa 2)  ! Compazine 3)  ! Macrobid  Past History:  Past medical, surgical, family and social histories (including risk factors) reviewed, and no changes noted (except as noted below).  Past Medical History: Reviewed history from 10/27/2008 and no changes required. GERD Pneumonia, hx of Allergic rhinitis, sees Dr. Corinda Gubler benign pineal gland cyst, last MRI 09-19-04 (5 yr follow up planned) Hypertension Dysfunctional uterine bleeding, sees Dr. Lily Peer Asthma sees Dr. Karlyn Agee for skin  checks  Past Surgical History: Reviewed history from 10/27/2008 and no changes required. Tonsillectomy D and C twice Wisdom Teeth extraction Hysterectomy right knee ACL reconstruction with medial and lateral meniscectomies 09-04-08 per Dr. Teryl Lucy  Family History: Reviewed history from 10/08/2006 and no changes required. Family History of Bowel disease Family History Diabetes 1st degree relative Family History Hypertension Family History Thyroid disease Family History of Cardiovascular disorder  Social History: Reviewed history from 10/08/2006 and no changes required. Occupation: Married Never Smoked Alcohol use-yes  Review of Systems       The patient complains of anorexia and fever.  The patient denies vision loss, decreased hearing, hoarseness, chest pain, syncope, dyspnea on exertion, peripheral edema, headaches, hemoptysis, abdominal pain, melena, hematochezia, severe indigestion/heartburn, hematuria, difficulty walking, abnormal bleeding, and angioedema.         minor raspy cough / allergy  Physical Exam  General:  mld mod ill in nad  Head:  normocephalic and atraumatic.   Eyes:  vision grossly intact, pupils equal, and pupils round.   Ears:  R ear normal, L ear normal, and no external deformities.   Nose:  no external deformity and no nasal discharge.   Mouth:  red 1+ no lesions  no edema Neck:  tender ac area no  masses supple.   Lungs:  Normal respiratory effort, chest  expands symmetrically. Lungs are clear to auscultation, no crackles or wheezes.no dullness.   Heart:  Normal rate and regular rhythm. S1 and S2 normal without gallop, murmur, click, rub or other extra sounds.no lifts.   Abdomen:  Bowel sounds positive,abdomen soft and non-tender without masses, organomegaly or   noted. Pulses:  nl cap refill  Extremities:  no clubbing cyanosis or edema  Neurologic:  nonf ocal  Skin:  turgor normal, color normal, no ecchymoses, no petechiae, and no purpura.    Cervical Nodes:  tender ac  Axillary Nodes:  No palpable lymphadenopathy Psych:  Oriented X3, normally interactive, and good eye contact.     Impression & Recommendations:  Problem # 1:  FEVER UNSPECIFIED (ICD-780.60) flu like  illness   almose but has sore throat   check strep test.     low risk for tick related disease.  non focal exam otherwise  Orders: Rapid Strep ([address])  Problem # 2:  SORE THROAT (ICD-462) see above  Her updated medication list for this problem includes:    Advil 200 Mg Caps (Ibuprofen) ..... Once daily as needed  Orders: Rapid Strep ([address])  Complete Medication List: 1)  Nexium 40 Mg Cpdr (Esomeprazole magnesium) .... Once daily 2)  Zyrtec Allergy 10 Mg Tabs (Cetirizine hcl) 3)  Flonase 50 Mcg/act Susp (Fluticasone propionate) .... 2 sprays once daily each nostril 4)  Toprol Xl 25 Mg Tb24 (Metoprolol succinate) .... Once daily 5)  K-tabs 10 Meq Tbcr (Potassium chloride) .... Once daily 6)  Advil 200 Mg Caps (Ibuprofen) .... Once daily as needed  Patient Instructions: 1)  observe and recheck  if fever  not better in 48-72 hours  or  rash or as needed . probably viral illness .   2)  signs rx as tolerated.    no work until better   Laboratory Results  Date/Time Received: July 05, 2009 11:45 AM  Date/Time Reported: July 05, 2009 11:45 AM   Other Tests  Rapid Strep: negative Comments Wynona Canes, CMA  July 05, 2009 11:45 AM

## 2010-02-26 NOTE — Letter (Signed)
Summary: [facility]  [facility]   Imported By: [name] 02/14/2009 10:58:54  _____________________________________________________________________  External Attachment:    Type:   Image     Comment:   External Document

## 2010-02-26 NOTE — Assessment & Plan Note (Signed)
Summary: tetanus shot   Nurse Visit   Allergies: 1)  ! Sulfa 2)  ! Compazine 3)  ! Macrobid  Immunizations Administered:  Tetanus Vaccine:    Vaccine Type: Td    Site: right deltoid    Mfr: Sanofi Pasteur    Dose: 0.5 ml    Route: IM    Given by: Raechel Ache, RN    Exp. Date: 02/22/2011    Lot #: [id number]    VIS given: 12/15/06 version given Jun 18, 2009.  Orders Added: 1)  TD Toxoids IM 7 YR + [90714] 2)  Admin 1st Vaccine [65784]

## 2010-02-27 ENCOUNTER — Other Ambulatory Visit: Payer: Self-pay | Admitting: *Deleted

## 2010-02-27 NOTE — Telephone Encounter (Signed)
Please call pt regarding her Nexium refill at [facility].

## 2010-02-28 NOTE — Letter (Signed)
Summary: RE: use of flex spending account   RE: use of flex spending account   Imported By: [name] 02/15/2010 09:43:05  _____________________________________________________________________  External Attachment:    Type:   Image     Comment:   External Document

## 2010-02-28 NOTE — Progress Notes (Signed)
Summary: rx toprol  xl to [facility]  Phone Note Call from Patient   Caller: Patient Summary of Call: changed insu providers now has [facility] and wants rx toprolol xl  Initial call taken by: Pura Spice, RN,  February 15, 2010 1:27 PM  Follow-up for Phone Call        done  faxed to [facility]  Follow-up by: Pura Spice, RN,  February 15, 2010 1:27 PM    New/Updated Medications: TOPROL XL 25 MG  TB24 (METOPROLOL SUCCINATE) once daily Prescriptions: TOPROL XL 25 MG  TB24 (METOPROLOL SUCCINATE) once daily  #90 x 3   Entered by:   Pura Spice, RN   Authorized by:   Nelwyn Salisbury MD   Signed by:   Pura Spice, RN on 02/15/2010   Method used:   Faxed to ...       [facility] (mail[facility]order)       [address] [place], [place]  [address]       Ph: [phone]       Fax: [phone]   RxID:   [id number]

## 2010-03-15 ENCOUNTER — Telehealth: Payer: Self-pay | Admitting: Family Medicine

## 2010-03-15 DIAGNOSIS — K219 Gastro-esophageal reflux disease without esophagitis: Secondary | ICD-10-CM

## 2010-03-15 MED ORDER — ESOMEPRAZOLE MAGNESIUM 40 MG PO CPDR: 40.0000 mg | DELAYED_RELEASE_CAPSULE | Freq: Every day | ORAL | Status: DC

## 2010-03-15 NOTE — Telephone Encounter (Signed)
Triage vm------please send a new rx for Nexium to [facility] [address]. Having trouble with her mail order. Will be out of  meds on Monday.

## 2010-03-15 NOTE — Telephone Encounter (Signed)
Ok sent to [facility] to [facility] [address]  Pt aware.

## 2010-04-06 ENCOUNTER — Ambulatory Visit (INDEPENDENT_AMBULATORY_CARE_PROVIDER_SITE_OTHER): Payer: BC Managed Care – PPO | Admitting: Family Medicine

## 2010-04-06 ENCOUNTER — Encounter: Payer: Self-pay | Admitting: Family Medicine

## 2010-04-06 DIAGNOSIS — J069 Acute upper respiratory infection, unspecified: Secondary | ICD-10-CM | POA: Insufficient documentation

## 2010-04-06 DIAGNOSIS — J45901 Unspecified asthma with (acute) exacerbation: Secondary | ICD-10-CM

## 2010-04-09 NOTE — Assessment & Plan Note (Signed)
Summary: congestion/rcd   Vital Signs:  Patient profile:   50 year old female Height:      66 inches (167.64 cm) Weight:      172 pounds (78.18 kg) BMI:     27.86 O2 Sat:      96 % on Room air Temp:     99.1 degrees F (37.28 degrees C) oral Pulse rate:   76 / minute Pulse rhythm:   regular BP sitting:   128 / 82  (left arm) Cuff size:   regular  Vitals Entered By: Brenton Grills CMA Duncan Dull) (April 06, 2010 10:08 AM)  O2 Flow:  Room air CC: ?URI infection (cough, congestion, wheezing)/aj Comments Pt has been taking clarinex for sxs   History of Present Illness: 50 year old female:  Starting on Wed, had some asthma, and a lot of allergy in general: sees allergist and gets routine allergy shots. Husband has been sick on for about eight days.  Thursday nights, has been having a lot more trouble. Coughing has been a lot more  a productive coughing.   A lot of sinus congestion, some ear pain.   Sneezing a lot right now.   Now wheezing. Has been to the ER for wheezing a year ago at the last time, and a couple of years. Requiring nebs, but no inpatient admission. Has required steroids on multiple occaisions.    Current Medications (verified): 1)  Nexium 40 Mg  Cpdr (Esomeprazole Magnesium) .... Once Daily 2)  Zyrtec Allergy 10 Mg  Tabs (Cetirizine Hcl) 3)  Flonase 50 Mcg/act  Susp (Fluticasone Propionate) .... 2 Sprays Once Daily Each Nostril 4)  Toprol Xl 25 Mg  Tb24 (Metoprolol Succinate) .... Once Daily 5)  K-Tabs 10 Meq  Tbcr (Potassium Chloride) .... Once Daily 6)  Advil 200 Mg Caps (Ibuprofen) .... Once Daily As Needed 7)  Prednisone 20 Mg Tabs (Prednisone) .... 2 Tabs Daily For 5 Days For Allergy Congestion  Allergies (verified): 1)  ! Sulfa 2)  ! Compazine 3)  ! Macrobid  Past History:  Past medical, surgical, family and social histories (including risk factors) reviewed, and no changes noted (except as noted below).  Past Medical History: Reviewed history  from 10/27/2008 and no changes required. GERD Pneumonia, hx of Allergic rhinitis, sees Dr. Corinda Gubler benign pineal gland cyst, last MRI 09-19-04 (5 yr follow up planned) Hypertension Dysfunctional uterine bleeding, sees Dr. Lily Peer Asthma sees Dr. Karlyn Agee for skin checks  Past Surgical History: Reviewed history from 10/27/2008 and no changes required. Tonsillectomy D and C twice Wisdom Teeth extraction Hysterectomy right knee ACL reconstruction with medial and lateral meniscectomies 09-04-08 per Dr. Teryl Lucy  Family History: Reviewed history from 10/08/2006 and no changes required. Family History of Bowel disease Family History Diabetes 1st degree relative Family History Hypertension Family History Thyroid disease Family History of Cardiovascular disorder  Social History: Reviewed history from 10/08/2006 and no changes required. Occupation: Married Never Smoked Alcohol use-yes  Review of Systems       REVIEW OF SYSTEMS GEN: Acute illness details above. CV: No chest pain PULM: as above GI: No noted N or V Otherwise, pertinent positives and negatives are noted in the HPI.   Physical Exam  General:  Well-developed,well-nourished,in no acute distress; alert,appropriate and cooperative throughout examination Head:  Normocephalic and atraumatic without obvious abnormalities. No apparent alopecia or balding. Ears:  External ear exam shows no significant lesions or deformities.  Otoscopic examination reveals clear canals, tympanic membranes are intact bilaterally -  SHOWS SEROUS EFFUSION Mouth:  Oral mucosa and oropharynx without lesions or exudates.  Teeth in good repair. Neck:  No deformities, masses, or tenderness noted. Lungs:  normal respiratory effort, no intercostal retractions, and no accessory muscle use.  decreased BS diffusely, but no crackles or wheezing Heart:  Normal rate and regular rhythm. S1 and S2 normal without gallop, murmur, click, rub or other extra  sounds. Extremities:  No clubbing, cyanosis, edema, or deformity noted with normal full range of motion of all joints.   Neurologic:  alert & oriented X3 and gait normal.   Cervical Nodes:  No lymphadenopathy noted Psych:  Cognition and judgment appear intact. Alert and cooperative with normal attention span and concentration. No apparent delusions, illusions, hallucinations   Impression & Recommendations:  Problem # 1:  ASTHMA, WITH ACUTE EXACERBATION (ICD-493.92) Assessment New URI induced asthma exacerbation suspect viral etiology   The following medications were removed from the medication list:    Prednisone 20 Mg Tabs (Prednisone) .Marland Kitchen... 2 tabs daily for 5 days for allergy congestion Her updated medication list for this problem includes:    Xopenex Hfa 45 Mcg/act Aero (Levalbuterol tartrate) .Marland Kitchen... 2 puffs q 4 hours as needed wheezing or cough    Prednisone 20 Mg Tabs (Prednisone) .Marland Kitchen... 2 tabs by mouth daily for 5 days, then 1 by mouth x 3 days  Problem # 2:  URI (ICD-465.9) Assessment: New  Her updated medication list for this problem includes:    Zyrtec Allergy 10 Mg Tabs (Cetirizine hcl)    Advil 200 Mg Caps (Ibuprofen) ..... Once daily as needed  Complete Medication List: 1)  Nexium 40 Mg Cpdr (Esomeprazole magnesium) .... Once daily 2)  Zyrtec Allergy 10 Mg Tabs (Cetirizine hcl) 3)  Flonase 50 Mcg/act Susp (Fluticasone propionate) .... 2 sprays once daily each nostril 4)  Toprol Xl 25 Mg Tb24 (Metoprolol succinate) .... Once daily 5)  K-tabs 10 Meq Tbcr (Potassium chloride) .... Once daily 6)  Advil 200 Mg Caps (Ibuprofen) .... Once daily as needed 7)  Xopenex Hfa 45 Mcg/act Aero (Levalbuterol tartrate) .... 2 puffs q 4 hours as needed wheezing or cough 8)  Prednisone 20 Mg Tabs (Prednisone) .... 2 tabs by mouth daily for 5 days, then 1 by mouth x 3 days Prescriptions: PREDNISONE 20 MG TABS (PREDNISONE) 2 tabs by mouth daily for 5 days, then 1 by mouth x 3 days  #13 x  0   Entered and Authorized by:   Hannah Beat MD   Signed by:   Hannah Beat MD on 04/06/2010   Method used:   Electronically to        [facility] (retail)       [address]       [place], [place]  [address]       Ph: [phone]       Fax: [phone]   RxID:   [id number] XOPENEX HFA 45 MCG/ACT AERO (LEVALBUTEROL TARTRATE) 2 puffs q 4 hours as needed wheezing or cough  #1 x 0   Entered and Authorized by:   Hannah Beat MD   Signed by:   Hannah Beat MD on 04/06/2010   Method used:   Print then Give to Patient   RxID:   [id number]    Orders Added: 1)  Est. Patient Level IV [75643]

## 2010-06-14 NOTE — Discharge Summary (Signed)
NAMEKATRISHA, Olivia Carr NO.:  [id number]   MEDICAL RECORD NO.:  [id number]          PATIENT TYPE:  INP   LOCATION:  9309                          [facility]ACILITY:  [facility]   PHYSICIAN:  Rudy Jew. Ashley Royalty, M.D.DATE OF BIRTH:  02-21-1960   DATE OF ADMISSION:  01/22/2006  DATE OF DISCHARGE:  01/24/2006                               DISCHARGE SUMMARY   DISCHARGE DIAGNOSES:  1. Multiple leiomyomas of the uterus.  2. Abnormal uterine bleeding - refractory to medical management.  3. Asthma.  4. Gastroesophageal reflux disease.  5. Hypertension.   OPERATIONS AND PROCEDURES:  Laparoscopically-assisted vaginal  hysterectomy.   CONSULTATIONS:  None.   DISCHARGE MEDICATIONS:  1. Percocet.  2. Motrin 600 mg.  3. Iron.   HISTORY AND PHYSICAL:  This is a 45-year gravida 3, para 2, AB 1 who  presented recently complaining of abnormal uterine bleeding.  She took  oral contraceptives without improvement.  She stated her bleeding was  quite debilitating and requested surgical intervention.  She has a known  fibroid uterus and the last ultrasound was January 08, 2006, which  revealed the uterus to be approximately 11 x 7 cm.  There were several  fibroids present, the largest of which was 3.2 cm in greatest diameter.   For the remainder of the history and physical, please see chart.   HOSPITAL COURSE:  The patient was admitted to [facility].  Admission laboratory studies were drawn.  On January 22, 2006, she was taken to the operating room and underwent laparoscopically-  assisted vaginal hysterectomy.  Procedure was accomplished by Dr. Sylvester Harder.  The patient's postoperative course was completely benign.  She was discharged on the second postoperative day afebrile and in  satisfactory condition.   DISPOSITION:  The patient is to return to [facility] in approximately 6 weeks for postoperative evaluation.     James A.  Ashley Royalty, M.D.  Electronically Signed    JAM/MEDQ  D:  02/11/2006  T:  02/11/2006  Job:  [id number]

## 2010-06-14 NOTE — Op Note (Signed)
Olivia Carr, DOUDS           ACCOUNT NO.:  [id number]   MEDICAL RECORD NO.:  [id number]          PATIENT TYPE:  AMB   LOCATION:  SDC                           [facility]   PHYSICIAN:  James A. Ashley Royalty, M.D.DATE OF BIRTH:  [birth year]   DATE OF PROCEDURE:  01/29/2005  DATE OF DISCHARGE:                                 OPERATIVE REPORT   PREOPERATIVE DIAGNOSES:  1.  Intrauterine polyp versus fibroid.  2.  Abnormal uterine bleeding.   POSTOPERATIVE DIAGNOSIS:  Intrauterine fibroid.  Pathology pending.   PROCEDURES:  1.  Diagnosis/operative hysteroscopy.  2.  Myomectomy.  3.  Dilatation and curettage.   SURGEON:  Rudy Jew. Ashley Royalty, M.D.   ANESTHESIA:  General.   ESTIMATED BLOOD LOSS:  50 mL.   COMPLICATIONS:  None.   PACKS AND DRAINS:  None.   FLUID DEFICIT:  Approximately 480 mL.   PROCEDURE:  The patient was taken to the operating room and placed in the  dorsal supine position.  After general anesthesia was administered, she was  placed in the lithotomy position and prepped and draped the usual manner for  vaginal surgery.  A posterior weighted retractor was placed per vagina.  The  anterior lip of the cervix was grasped with a single-tooth tenaculum.  The  uterus was gently sounded to approximately 8 cm.  The cervix was then  serially dilated to a size 29-French using Shawnie Pons dilators.  The resectoscope  was placed using sorbitol as a distension medium.  The maximum pressure was  100.  The intrauterine cavity was visualized.  The left tubal ostium was  easily visualized.  In the uterine cavity there was a large submucosal  fibroid noted with a component that was also intramural.  The right tubal  ostium was visualized with some difficulty.  Using the cutting waveform, the  intrauterine component of the fibroid was shaved and removed in a piecemeal  fashion at approximately 50 watts cutting waveform.  Careful attention was  paid to avoid the cornual area due to  its thinness.  As much of the  submucosal component of the fibroid was removed as possible in order to  avoid the risk of perforation.  All the fragments were removed from the  uterus and submitted to pathology for histologic studies.  Hemostasis was  obtained with the coagulation waveform.  Appropriate photographs were  obtained before and after.   Attention was then turned to the uterine curettage.  A medium-sized curette  was introduced into the uterine cavity.  A four-quadrant technique of  curettage was initially employed.  Then a therapeutic technique was  employed.  All curettings were sent to pathology separately for histologic  studies.   The resectoscope was once again placed.  Small bleeders were addressed with  the coagulation waveform until it was felt to be prudent to cease the  procedure.  At this point the vaginal instruments were removed.  Hemostasis  was noted.  The procedure was terminated.   The patient was taken to the recovery room in excellent condition.      James A. Ashley Royalty, M.D.  Electronically  Signed     JAM/MEDQ  D:  01/29/2005  T:  01/29/2005  Job:  [id number]

## 2010-06-14 NOTE — Assessment & Plan Note (Signed)
[facility] OFFICE NOTE   NAME:Olivia Carr, Olivia Carr                  MRN:          [id number]  DATE:09/18/2005                            DOB:          10/21/1960    This is a 50 year old woman here for a non-gynecological physical  examination.  In general, she is doing well but does have one complaint to  mention over the last few summer months.  She has had some mild swelling  around her feet and ankles that is mildly uncomfortable.  She would like a  p.r.n. medication that she can use for that.  Otherwise, she thinks her  blood pressure is stable.  She continues to see Dr. Ashley Royalty on a regular  basis for gynecology exams.  Her allergies and heartburn are doing well  also.  For details of her past medical history, family history, social  history, habits, etc., refer to her last physical note dated September 13, 2004.  Of note, she does have a benign pineal cyst.  This has been seen on  brain MRIs twice, the most recent evaluation was done on September 19, 2004.  At that time, no changes were seen, and we recommended a 5-year followup MRI  scan.   ALLERGIES:  SULFA, COMPAZINE AND MACROBID.   CURRENT MEDICATIONS:  1. Allergy shots twice a week.  2. Nexium 40 mg per day.  3. Zyrtec 10 mg per day.  4. Flonase sprays daily.  5. Potassium chloride 20 mEq per day.  6. Toprol XL 25 mg per day.  7. Zenapax inhaler as needed.  8. Fem-con daily.   OBJECTIVE:  VITAL SIGNS:  Height 5 feet 6 inches, weight 169, BP 120/80,  pulse 70 and regular.  GENERAL:  She is mildly overweight.  SKIN:  Free of significant lesions.  HEENT:  Eyes clear sclerae, pharynx clear.  NECK:  Supple without lymphadenopathy or masses.  LUNGS:  Clear.  CARDIAC:  Regular rate and rhythm rate without gallops, murmurs, rubs.  EXTREMITIES:  Distal pulses are full.  ABDOMEN:  Soft, normal bowel sounds, nontender, no masses.  EXTREMITIES:   Nontender, no masses.  No clubbing or cyanosis.  There is  trace edema in both ankles.  NEUROLOGIC:  Grossly intact.  She was here for fasting labs on August 17th.  These were all within normal limits.   ASSESSMENT/PLAN:  1. Complete physical.  We talked about increasing exercise and losing a      little weight.  2. Hypertension, stable.  3. Dysfunctional uterine bleeding per Dr. Ashley Royalty.  4. Gastroesophageal reflux disease, stable.  5. Allergies, stable.  6. Lower extremity edema, Lasix 20 mg once a day on an as-needed basis.                                   Tera Mater. Clent Ridges, MD   SAF/MedQ  DD:  09/18/2005  DT:  09/18/2005  Job #:  [id number]

## 2010-06-14 NOTE — H&P (Signed)
NAMEYOHANNA, TOW NO.:  [id number]   MEDICAL RECORD NO.:  [id number]          PATIENT TYPE:  AMB   LOCATION:  SDC                           [facility]   PHYSICIAN:  James A. Ashley Royalty, M.D.DATE OF BIRTH:  [birth year]   DATE OF ADMISSION:  DATE OF DISCHARGE:                              HISTORY & PHYSICAL   This is a 50 year old, gravida 3, para 2, AB 1 who presented to me  recently complaining of abnormal uterine bleeding.  She has taken Ovcon  35 without improvement of her menorrhagia.  She states the bleeding  pattern is quite debilitating and wants surgical intervention.  She has  a known fibroid uterus.  Last ultrasound was 01/08/2006, which revealed  uterus to be 11 x 6 x 7 cm and newer, small fibroids, the largest of  which is 3.2 cm in greatest diameter.   MEDICATIONS:  Toprol XL, Zyrtec, Flonase, Nexium, potassium.   PAST MEDICAL HISTORY:  1. Medical hypertension.  2. GERD.  3. Multiple allergies, including sulfa, Compazine and Macrobid.  4. Asthma.   PAST SURGICAL HISTORY:  D&C 1990, tonsillectomy 1967.   FAMILY HISTORY:  Colon cancer, hypertension and heart disease.   SOCIAL HISTORY:  The patient denies use of tobacco or significant  alcohol.   REVIEW OF SYSTEMS:  Noncontributory.   ALLERGIES:  SULFA, COMPAZINE, MACROBID.   PHYSICAL EXAMINATION:  Well-developed, well-nourished, pleasant, white  female in no acute distress.  Afebrile, vital signs stable.  Chest:  Lungs are clear.  Cardiac:  Regular rate and rhythm.  Abdomen:  Soft,  nontender without masses or organomegaly.  Musculoskeletal:  No  significant edema.  Pelvic:  Please see most recent office examination.  External genitalia within normal limits.  Vagina and cervix are without  gross lesions.  Bimanual examination reveals uterus to be approximately  10 x 6 x 6 cm, slightly nodular and no adnexal masses palpable.   IMPRESSION:  1. Fibroid uterus.  2. Hypertension.  3.  Gastroesophageal reflux disease.  4. Asthma.  5. Multiple allergies.  6. Abnormal uterine bleeding, refractory to medical management.   PLAN:  Laparoscopically-assisted vaginal hysterectomy.  Risks, benefits,  complications and alternatives fully discussed with the patient.  Possibility of unilateral salpingo-oophorectomy or bilateral salpingo-  oophorectomy  discussed and accepted.  The latter event, the patient understands she  would become a candidate for hormone replacement therapy and agrees to  same.  We also discussed the possibility of exploratory laparotomy  and/or total abdominal hysterectomy.  The patient is agreeable to same.  Questions are evaluated and answered.      James A. Ashley Royalty, M.D.  Electronically Signed     JAM/MEDQ  D:  01/22/2006  T:  01/22/2006  Job:  [id number]

## 2010-06-14 NOTE — Op Note (Signed)
Olivia Carr, MCKIVER NO.:  [id number]   MEDICAL RECORD NO.:  [id number]          PATIENT TYPE:  INP   LOCATION:  9309                          [facility]   PHYSICIAN:  Rudy Jew. Ashley Royalty, M.D.DATE OF BIRTH:  [birth year]   DATE OF PROCEDURE:  01/22/2006  DATE OF DISCHARGE:  01/24/2006                               OPERATIVE REPORT   PREOPERATIVE DIAGNOSIS:  1. Fibroid uterus.  2. Abnormal uterine bleeding - refractory to medical management.   POSTOPERATIVE DIAGNOSIS:  1. Fibroid uterus.  2. Abnormal uterine bleeding - refractory to medical management.  3. Path pending.   PROCEDURE:  Laparoscopic assisted vaginal hysterectomy.   SURGEON:  Rudy Jew. Ashley Royalty, M.D.   ANESTHESIA:  General.   ESTIMATED BLOOD LOSS:  150 mL.   COMPLICATIONS:  None.   PACKS AND DRAINS:  Foley.   COUNTS:  Sponge, needle, and instrument counts were reported as correct  x2.   DESCRIPTION OF PROCEDURE:  The patient was taken to the operating room  and placed in the dorsal supine position.  After general anesthetic was  administered, she was placed in the lithotomy position and prepped and  draped in the usual manner for abdominal and vaginal surgery.  A 1.2 cm  infraumbilical incision was made in the transverse plane.  A Veress  needle was inserted into the abdominal cavity.  Its location was  verified by instillation of saline and hanging drop techniques.  Next, 3  liters of CO2 were instilled at 1 liter per minute to create  pneumoperitoneum.  Next, a size 10/11 disposable laparoscopic trocar was  introduced into the abdominal cavity.  Its location was verified by  placement of the laparoscope.  There was no evidence of any trauma.  Pneumoperitoneum was maintained throughout.  Next, two 5 mm trocars were  placed in the left and right lower quadrants respectively using  transillumination and direct visualization techniques.  The pelvis was  identified.  The uterus was  normal size, shape and contour without any  subserosal fibroids or endometriosis.  The fallopian tubes were normal  size, shape, contour and length.  The ovaries were normal size, shape,  and contour without evidence of any cysts or endometriosis.  Appropriate  photos were obtained.  The remainder of the peritoneal surfaces were  smooth and glistening.  The round ligaments were grasped with the gyrus  coagulator and divided after coagulation.  The utero-ovarian anastomoses  were clamped, coagulated, and divided with the gyrus.  The fallopian  tubes, similarly, were grasped, coagulated, and divided at the junction  with the uterus.   Attention was then turned to the vaginal portion of the procedure.  A  posterior weighted retractor was placed and the anterior lip of the  cervix was grasped with a Jacobs tenaculum.  Approximately 20 mL of 1%  Xylocaine with epinephrine were instilled circumferentially to aid in  hemostasis.  The cervix was then circumscribed with a knife.  A  posterior colpotomy incision was successfully made.  The Steiner-Avard  retractor was placed in the cul-de-sac.  The uterosacral ligaments were  bilaterally clamped, cut and  secured with 0 Vicryl.  The cardinal  ligaments were bilaterally clamped, cut and secured with 0 Vicryl.  The  anterior cul-de-sac was successfully entered.  The uterine vessels were  then doubly clamped, cut and doubly secured with 0 Vicryl.  One  additional bite was taken on either side cephalad to the uterine vessels  and the pedicles secured with 0 Vicryl.  At this point, the uterus was  successfully delivered through the vagina and submitted to pathology for  histologic studies.  Next, a posterior running locking stitch was placed  in the uterosacral ligament to close the posterior vaginal cuff.  Irrigation was accomplished and hemostasis was noted.  The vagina was  then closed in an anterior to posterior fashion using interrupted  sutures of  0 Vicryl.   Attention was then turned to the laparoscopy.  The pneumoperitoneum was  recreated and the pelvis was visualized through the laparoscope.  Copious irrigation was accomplished with the suction irrigator.  All  pedicles were inspected and found to be hemostatic.  At this point, the  patient was felt to have benefited maximally from the surgical  procedure.  The abdominal instruments were removed and pneumoperitoneum  evacuated.  The fascial defects were closed with 0 Vicryl in an  interrupted fashion.  The skin was closed with 3-0 Monocryl in a  subcuticular fashion.  The patient was then returned to the recovery  room in good condition.  At the conclusion of the procedure, the urine  was clear and copious.      James A. Ashley Royalty, M.D.  Electronically Signed     JAM/MEDQ  D:  01/24/2006  T:  01/24/2006  Job:  [id number]

## 2010-06-14 NOTE — H&P (Signed)
NAMEMAURI, Olivia Carr NO.:  [id number]   MEDICAL RECORD NO.:  [id number]            PATIENT TYPE:   LOCATION:                                 FACILITY:   PHYSICIAN:  Rudy Jew. Ashley Royalty, M.D.     DATE OF BIRTH:   DATE OF ADMISSION:  01/29/2005  DATE OF DISCHARGE:                                HISTORY & PHYSICAL   This is a 50 year old gravida 3, para 2, AB1 with a history of menorrhagia  and metrorrhagia including clotting.  She states her menstrual periods are  approximately every 21 to every 24 days and tend to last seven to nine days.  Sonohysterogram was performed January 09, 2005.  There was a 2.2 x 2 cm  echogenic focus seen within the endometrial cavity.  It was felt to  represent an intrauterine polyp versus a leiomyoma.  Hence, patient is for  diagnostic/operative hysteroscopy and dilatation and curettage.   MEDICATIONS:  Flonase, Zyrtec, Toprol XL, Nexium, potassium supplementation.   PAST MEDICAL HISTORY:  Hypertension.   PAST SURGICAL HISTORY:  1.  D&C 1990.  2.  Tonsillectomy 1967.   ALLERGIES:  SULFA, COMPAZINE, MACROBID.   FAMILY HISTORY:  Positive for hypertension and colon cancer as well as heart  disease.   SOCIAL HISTORY:  Patient denies use of tobacco or significant alcohol.   REVIEW OF SYSTEMS:  Noncontributory.   PHYSICAL EXAMINATION:  GENERAL:  Well-developed, well-nourished, pleasant  white female in no acute distress.  VITAL SIGNS:  Afebrile.  Vital signs stable.  CHEST:  Lungs are clear.  CARDIAC:  Regular rate and rhythm.  ABDOMEN:  Soft and nontender.  MUSCULOSKELETAL:  No edema.  PELVIC:  Please see most recent office note.  External genitalia within  normal limits.  Vagina and cervix are without gross lesions.  Bimanual  examination revealed the uterus to be approximately 9 x 6 x 5 cm with a  fundal eight side protuberance.  No adnexal masses palpable.   IMPRESSION:  1.  Fibroid uterus.  2.  Hypertension.  3.   Gastroesophageal reflux disease.  4.  Intrauterine polyp versus fibroid on recent sonohysterogram.  5.  Menorrhagia and metrorrhagia probably secondary to #4.   PLAN:  1.  Diagnostic/operative hysteroscopy.  2.  Dilatation and curettage.  Risks, benefits, and alternatives fully      discussed with the patient.  She states she understands and accepts.      Questions invited and answered.  Please note a portion of the evaluation      for this dictation was performed in the outpatient setting.      James A. Ashley Royalty, M.D.  Electronically Signed     JAM/MEDQ  D:  01/29/2005  T:  01/29/2005  Job:  [id number]

## 2010-08-27 ENCOUNTER — Other Ambulatory Visit: Payer: Self-pay

## 2010-08-27 DIAGNOSIS — K219 Gastro-esophageal reflux disease without esophagitis: Secondary | ICD-10-CM

## 2010-08-27 MED ORDER — ESOMEPRAZOLE MAGNESIUM 40 MG PO CPDR: 40.0000 mg | DELAYED_RELEASE_CAPSULE | Freq: Every day | ORAL | Status: DC

## 2010-11-04 ENCOUNTER — Other Ambulatory Visit (INDEPENDENT_AMBULATORY_CARE_PROVIDER_SITE_OTHER): Payer: BC Managed Care – PPO

## 2010-11-04 DIAGNOSIS — Z0000 Encounter for general adult medical examination without abnormal findings: Secondary | ICD-10-CM

## 2010-11-04 LAB — CBC WITH DIFFERENTIAL/PLATELET
Basophils Absolute: 0 10*3/uL (ref 0.0–0.1)
Basophils Relative: 0.5 % (ref 0.0–3.0)
Eosinophils Relative: 2.2 % (ref 0.0–5.0)
Hemoglobin: 13.9 g/dL (ref 12.0–15.0)
Lymphocytes Relative: 24.6 % (ref 12.0–46.0)
Monocytes Relative: 7.3 % (ref 3.0–12.0)
Neutro Abs: 4.6 10*3/uL (ref 1.4–7.7)
RBC: 4.59 Mil/uL (ref 3.87–5.11)
RDW: 13.8 % (ref 11.5–14.6)
WBC: 7 10*3/uL (ref 4.5–10.5)

## 2010-11-04 LAB — LIPID PANEL
HDL: 58 mg/dL (ref >39.00)
VLDL: 24.6 mg/dL (ref 0.0–40.0)

## 2010-11-04 LAB — BASIC METABOLIC PANEL
Calcium: 8.7 mg/dL (ref 8.4–10.5)
GFR: 83.76 mL/min (ref >60.00)
Sodium: 140 mEq/L (ref 135–145)

## 2010-11-04 LAB — POCT URINALYSIS DIPSTICK
Bilirubin, UA: NEGATIVE
Leukocytes, UA: NEGATIVE
Nitrite, UA: NEGATIVE
Protein, UA: NEGATIVE
pH, UA: 6

## 2010-11-04 LAB — HEPATIC FUNCTION PANEL
ALT: 15 U/L (ref 0–35)
Total Bilirubin: 1.1 mg/dL (ref 0.3–1.2)

## 2010-11-04 LAB — TSH: TSH: 1.35 u[IU]/mL (ref 0.35–5.50)

## 2010-11-07 ENCOUNTER — Telehealth: Payer: Self-pay | Admitting: Family Medicine

## 2010-11-07 ENCOUNTER — Encounter: Payer: Self-pay | Admitting: Family Medicine

## 2010-11-07 NOTE — Telephone Encounter (Signed)
Message copied by Baldemar Friday on Thu Nov 07, 2010  4:05 PM ------      Message from: Gershon Crane A      Created: Tue Nov 05, 2010  6:07 AM       normal

## 2010-11-07 NOTE — Telephone Encounter (Signed)
Left voice message with results.

## 2010-11-11 ENCOUNTER — Encounter: Payer: Self-pay | Admitting: Family Medicine

## 2010-11-11 ENCOUNTER — Ambulatory Visit (INDEPENDENT_AMBULATORY_CARE_PROVIDER_SITE_OTHER): Payer: BC Managed Care – PPO | Admitting: Family Medicine

## 2010-11-11 VITALS — BP 110/78 | HR 63 | Temp 98.3°F | Ht 65.25 in | Wt 168.0 lb

## 2010-11-11 DIAGNOSIS — Z23 Encounter for immunization: Secondary | ICD-10-CM

## 2010-11-11 DIAGNOSIS — K219 Gastro-esophageal reflux disease without esophagitis: Secondary | ICD-10-CM

## 2010-11-11 DIAGNOSIS — Z0000 Encounter for general adult medical examination without abnormal findings: Secondary | ICD-10-CM

## 2010-11-11 MED ORDER — ESOMEPRAZOLE MAGNESIUM 40 MG PO CPDR: 40.0000 mg | DELAYED_RELEASE_CAPSULE | Freq: Every day | ORAL | Status: DC

## 2010-11-11 MED ORDER — FLUTICASONE PROPIONATE 50 MCG/ACT NA SUSP: 2.0000 | Freq: Every day | NASAL | Status: DC

## 2010-11-11 MED ORDER — METOPROLOL SUCCINATE ER 25 MG PO TB24: 25.0000 mg | ORAL_TABLET | Freq: Every day | ORAL | Status: DC

## 2010-11-11 MED ORDER — LEVALBUTEROL TARTRATE 45 MCG/ACT IN AERO: 1.0000 | INHALATION_SPRAY | RESPIRATORY_TRACT | Status: DC | PRN

## 2010-11-11 MED ORDER — POTASSIUM CHLORIDE 10 MEQ PO TBCR: 10.0000 meq | EXTENDED_RELEASE_TABLET | Freq: Every day | ORAL | Status: DC

## 2010-11-11 NOTE — Progress Notes (Signed)
  Subjective:    Patient ID: Olivia Carr, female    DOB: 1960/04/06, 50 y.o.   MRN: [id number]  HPI 50 yr old female for a cpx. She feels well and has no concerns.   Review of Systems  Constitutional: Negative.   HENT: Negative.   Eyes: Negative.   Respiratory: Negative.   Cardiovascular: Negative.   Gastrointestinal: Negative.   Genitourinary: Negative for dysuria, urgency, frequency, hematuria, flank pain, decreased urine volume, enuresis, difficulty urinating, pelvic pain and dyspareunia.  Musculoskeletal: Negative.   Skin: Negative.   Neurological: Negative.   Hematological: Negative.   Psychiatric/Behavioral: Negative.        Objective:   Physical Exam  Constitutional: She is oriented to person, place, and time. She appears well-developed and well-nourished. No distress.  HENT:  Head: Normocephalic and atraumatic.  Right Ear: External ear normal.  Left Ear: External ear normal.  Nose: Nose normal.  Mouth/Throat: Oropharynx is clear and moist. No oropharyngeal exudate.  Eyes: Conjunctivae and EOM are normal. Pupils are equal, round, and reactive to light. No scleral icterus.  Neck: Normal range of motion. Neck supple. No JVD present. No thyromegaly present.  Cardiovascular: Normal rate, regular rhythm, normal heart sounds and intact distal pulses.  Exam reveals no gallop and no friction rub.   No murmur heard. Pulmonary/Chest: Effort normal and breath sounds normal. No respiratory distress. She has no wheezes. She has no rales. She exhibits no tenderness.  Abdominal: Soft. Bowel sounds are normal. She exhibits no distension and no mass. There is no tenderness. There is no rebound and no guarding.  Musculoskeletal: Normal range of motion. She exhibits no edema and no tenderness.  Lymphadenopathy:    She has no cervical adenopathy.  Neurological: She is alert and oriented to person, place, and time. She has normal reflexes. No cranial nerve deficit. She exhibits  normal muscle tone. Coordination normal.  Skin: Skin is warm and dry. No rash noted. No erythema.  Psychiatric: She has a normal mood and affect. Her behavior is normal. Judgment and thought content normal.          Assessment & Plan:  She needs to get more exercise. Otherwise she is doing well.

## 2010-11-11 NOTE — Progress Notes (Signed)
Addended by: Aniceto Boss A on: 11/11/2010 02:32 PM   Modules accepted: Orders

## 2010-12-03 ENCOUNTER — Other Ambulatory Visit: Payer: Self-pay | Admitting: Gynecology

## 2010-12-03 DIAGNOSIS — Z1231 Encounter for screening mammogram for malignant neoplasm of breast: Secondary | ICD-10-CM

## 2011-01-06 ENCOUNTER — Ambulatory Visit (HOSPITAL_COMMUNITY)
Admission: RE | Admit: 2011-01-06 | Discharge: 2011-01-06 | Disposition: A | Discharge status: Home / Self Care | Payer: BC Managed Care – PPO | Source: Ambulatory Visit | Attending: Gynecology | Admitting: Gynecology

## 2011-01-06 DIAGNOSIS — Z1231 Encounter for screening mammogram for malignant neoplasm of breast: Secondary | ICD-10-CM

## 2011-01-16 ENCOUNTER — Ambulatory Visit (INDEPENDENT_AMBULATORY_CARE_PROVIDER_SITE_OTHER): Payer: BC Managed Care – PPO | Admitting: Gynecology

## 2011-01-16 ENCOUNTER — Other Ambulatory Visit (HOSPITAL_COMMUNITY)
Admission: RE | Admit: 2011-01-16 | Discharge: 2011-01-16 | Disposition: A | Discharge status: Home / Self Care | Payer: BC Managed Care – PPO | Source: Ambulatory Visit | Attending: Gynecology | Admitting: Gynecology

## 2011-01-16 ENCOUNTER — Encounter: Payer: Self-pay | Admitting: Gynecology

## 2011-01-16 VITALS — BP 128/84 | Ht 65.0 in | Wt 169.0 lb

## 2011-01-16 DIAGNOSIS — Z01419 Encounter for gynecological examination (general) (routine) without abnormal findings: Secondary | ICD-10-CM | POA: Insufficient documentation

## 2011-01-16 DIAGNOSIS — R823 Hemoglobinuria: Secondary | ICD-10-CM

## 2011-01-16 NOTE — Progress Notes (Signed)
Olivia Carr 08/06/1960 [id number]   History:    50 y.o.  for annual exam with no complaints today. Her primary physician is Dr. Gershon Crane who did her medical exam lab work recently. Review of her record indicated she was weighing 176 is down to 169 she's currently better and exercising more regular basis. She does her monthly self breast examination her last mammogram early this month. Patient with prior history of laparoscopic vaginal hysterectomy.  Past medical history,surgical history, family history and social history were all reviewed and documented in the EPIC chart.  Gynecologic History No LMP recorded. Patient has had a hysterectomy. Contraception: none Last Pap: 2011. Results were:normal} Last mammogram: 2012. Results were:normal}  Obstetric History OB History    Grav Para Term Preterm Abortions TAB SAB Ect Mult Living   3 2 2  1  1   2      # Outc Date GA Lbr Len/2nd Wgt Sex Del Anes PTL Lv   1 TRM     F SVD  No Yes   2 TRM     F SVD  No Yes   3 SAB                ROS:  Was performed and pertinent positives and negatives are included in the history.  Exam: chaperone present  BP 128/84  Ht 5\' 5"  (1.651 m)  Wt 169 lb (76.658 kg)  BMI 28.12 kg/m2  Body mass index is 28.12 kg/(m^2).  General appearance : Well developed well nourished female. No acute distress HEENT: Neck supple, trachea midline, no carotid bruits, no thyroidmegaly Lungs: Clear to auscultation, no rhonchi or wheezes, or rib retractions  Heart: Regular rate and rhythm, no murmurs or gallops Breast:Examined in sitting and supine position were symmetrical in appearance, no palpable masses or tenderness,  no skin retraction, no nipple inversion, no nipple discharge, no skin discoloration, no axillary or supraclavicular lymphadenopathy Abdomen: no palpable masses or tenderness, no rebound or guarding Extremities: no edema or skin discoloration or tenderness  Pelvic:  Bartholin, Urethra, Skene  Glands: Within normal limits             Vagina: No gross lesions or discharge  Cervix: Absent  Uterus absent  Adnexa  Without masses or tenderness  Anus and perineum  normal   Rectovaginal  normal sphincter tone without palpated             masses or tenderness             Hemoccult not done     Assessment/Plan:  50 y.o. female for annual exam unremarkable. She was instructed to take calcium and vitamin D for osteoporosis prevention. Her recent labs were all normal at her primary physician's office and I do not see a CBC and urinalysis and I will be drawn in one today. Patient with no prior history of dysplasia. We discussed the new guidelines that she does not need further Pap smears but she should still come in for gynecological exam regardless. We discussed importance of exercise 3-4 times a week weightbearing to decrease risk of osteoporosis. She was also encouraged to do her monthly self was examination. Her flu vaccine is up-to-date.    Ok Edwards MD, 10:02 AM 01/16/2011

## 2011-01-16 NOTE — Patient Instructions (Addendum)
Dietary therapy for weight gain   INTRODUCTION - The optimal management of overweight and obesity requires a combination of diet, exercise, and behavioral modification. In addition, some patients eventually require pharmacologic therapy or bariatric surgery. The risk of overweight to the subject should be evaluated before beginning any treatment program. Selection of treatment can then be made using a risk-benefit assessment). The choice of therapy is dependent on several factors including the degree of overweight or obesity and patient preference.  This topic will review the dietary therapy of obesity. Other aspects of treatment are discussed separately. (See "Health hazards associated with obesity in adults" and "Overview of therapy for obesity in adults" and "Drug therapy of obesity" and "Behavioral strategies in the treatment of obesity".) GOALS OF WEIGHT LOSS - It is important to set goals when discussing a dietary weight loss program with an individual patient. An initial weight loss goal of 5 to 7 percent of body weight is realistic for most individuals. The first goal for any overweight individual is to prevent further weight gain and keep body weight stable (within 5 pounds of its current level).  The goal of the clinician is to identify and review with the patient a realistic weight-loss goal. Most patients have a weight loss goal of 30 percent or more below current weight, which is unrealistic [1].  A successful program will lead to a weight loss of more than 5 percent of initial weight [2]. A weight loss of more than 5 percent can reduce risk factors for cardiovascular disease, such as dyslipidemia, hypertension, and diabetes mellitus [3]. In the Diabetes Prevention Program, a multi-center trial in patients with impaired glucose tolerance, weight loss of 7 percent reduced the rate of progression from impaired glucose tolerance to diabetes by 58 percent  [4]. (See "Prediction and prevention of type 2 diabetes mellitus", section on 'Diabetes Prevention Program'.)  Loss of 5 percent of initial body weight and maintenance of this loss is a good medical result, even if the subject does not reach his or her "dream" weight.  Although an extremely difficult goal to achieve, a body mass index (BMI) between 20 and 25 kg/m2 puts the subject in the lowest risk category (table 1 and figure 1). DIETARY ENERGY Rate of weight loss - The rate of weight loss is directly related to the difference between the subject's energy intake and energy requirements. Reducing caloric intake below expenditure results in a predictable initial rate of weight loss that is related to the energy deficit [5,6]. However, prediction of weight loss for an individual subject can be difficult because of marked intersubject variability in initial body composition, adherence, and energy expenditure [5,7]. Food records are often inaccurate. Most normal-weight people under-report what they eat by 10 to 30 percent, while overweight people under-report by 30 percent or more [8]. In addition, energy requirements are influenced by fidgeting, gender, age, and genetic factors [5,6,9]. As examples: Men lose more weight than women of similar height and weight when they comply with eating any given diet because men have more lean body mass, less percent body fat, and therefore higher energy expenditure.  Older subjects of either sex have a lower energy expenditure and therefore lose weight more slowly than younger subjects; metabolic rate declines by approximately 2 percent per decade (about 100 kcal/decade) [10].  The importance of genetic factors is illustrated by a study of identical female twin pairs who were overfed to induce weight gain [11]. Twelve twin pairs were overfed by 1000 kcal/day for  84 of 100 days. The degree of weight gain at a constant dietary caloric increment varied widely among the twin pairs  (from 4.3 to 13.3 kg), in fact, there was three times the variance for both weight and fat mass among the twin pairs when compared with that within the twin pairs. Approximately 22 kcal/kg is required to maintain a kilogram of body weight in a normal adult. Thus, the expected or calculated energy expenditure for a woman weighing 100 kg is approximately 2200 kcal/day. The variability of 20 percent could give energy needs as high as 2620 kcal/day or as low as 1860 kcal/day. An average deficit of 500 kcal/day should result in an initial weight loss of approximately 0.5 kg/week (1 lb/week). However, after three to six months of weight loss, energy expenditure adaptations occur, which slow the bodyweight response to a given change in energy intake, thereby diminishing ongoing weight loss [7]. There are several methods of formally estimating energy expenditure; we suggest using the WHO criteria (table 2). This method allows a direct estimate of resting metabolic rate (RMR) and calculation of daily energy requirement. The low activity level (1.3 x RMR) includes subjects who lead a sedentary life. The high activity level (1.7 x RMR) applies to those in jobs requiring manual labor or patients with regular daily physical exercise programs [12]. Maintenance of weight loss - It is important for the overweight subject to understand that achieving and maintaining weight loss is made difficult by the reduction in energy expenditure that is induced by weight loss (figure 2) [13]. Weight loss maintenance is also difficult because of changes in the peripheral hormone signals that regulate appetite. Gastrointestinal peptides, such as ghrelin, which stimulates appetite, and gastric inhibitory polypeptide, which may promote energy storage, increase after diet-induced weight loss. Other circulating mediators that inhibit intake (eg, leptin, peptide YY, cholecystokinin, pancreatic polypeptide) decrease. These hormonal adaptations  favoring weight gain persist for at least one year after diet-induced weight loss [14]. (See "Overview of therapy for obesity in adults", section on 'Maintenance of weight loss' and "Pathogenesis of obesity", section on 'Ghrelin'.)  TYPES OF DIETS - The general consensus is that excess intake of calories from any source, associated with a sedentary lifestyle, causes weight gain and obesity. The goal of dietary therapy, therefore, is to decrease energy intake from food. Conventional diets are defined as those below energy requirements but above 800 kcal/day [15]. These diets fall into four groups: Balanced low-calorie diets/portion-controlled diets  Low-fat diets  Low-carbohydrate diets  Mediterranean diet  Fad diets (diets involving unusual combinations of foods or eating sequences) Commercial weight loss programs and internet-based programs are discussed elsewhere. (See "Behavioral strategies in the treatment of obesity".) Balanced low-calorie diets - Planning a diet requires the selection of a caloric intake and then selection of foods to meet this intake. It is desirable to eat foods with adequate nutrients in addition to protein, carbohydrate, and essential fatty acids. Thus, weight-reducing diets should eliminate alcohol, sugar-containing beverages, and most highly concentrated sweets because they rarely contain adequate amounts of other nutrients besides energy. Breakdown of some protein is to be expected during weight loss. When weight increases as a result of overeating, approximately 75 percent of the extra energy is stored as fat and the remaining 25 percent as lean tissue. If the lean tissue contains 20 percent protein, then 5 percent of the extra weight gain would be protein. Thus, it should be anticipated that during weight loss, at least 5 percent of weight loss will  be protein. A desirable feature of any calorie restricted diet, however, is that it results in the lowest possible loss of  protein, recognizing that this will not be less than 5 percent of the weight that is lost. Portion-controlled diets - One simple approach to providing a calorie-controlled diet is to use individually packaged foods, such as formula diet drinks using powdered or liquid formula diets, nutrition bars, frozen food, and pre-packaged meals that can be stored at room temperature as the main source of nutrients. Frozen low-calorie meals containing 250 to 350 kcal/package can be a convenient and nutritious way to do this. We have often recommended the use of formula diets or breakfast bars for breakfast, formula diets or a frozen lunch entree for lunch, and a frozen calorie-controlled entree with additional vegetables for dinner. In this way, it is possible to obtain a calorie-controlled 1000 to 1500 kcal per day diet. In one four-year study this approach resulted in early initial weight loss, which then was maintained [16]. I do not recommend the use of formula diets alone because they do not provide adequate nutritional variety. Low-fat diets - Low-fat diets are another standard strategy to help patients lose weight, and almost all dietary guidelines recommend a reduction in the daily intake of fat to 30 percent of energy intake or less [17,18]. In a meta-analysis of trials comparing low-fat diets (typically 20 to 25 percent of energy from fats) with a control group consuming a usual diet or a medium fat diet (usually 35 to 40 percent of energy), there was greater weight loss (approximately 3 kg) with low-fat compared with moderate fat diets [19]. In addition, one report noted that people who successfully keep their weight reduced adopt three strategies, one of which is eating a lower fat diet [20]. (See "Dietary fat" and "Etiology and natural history of obesity", section on 'Dietary habits'.) A low-fat dietary pattern with healthy carbohydrates is not associated with weight gain. This was illustrated by the [facility] Dietary Modification Trial of [phone] postmenopausal women over age 32 years who were randomly assigned to a dietary intervention that included group and individual sessions to promote a decrease in fat intake and increases in fruit, vegetable, and grain consumption (healthy carbohydrates), but did not include weight loss or caloric restriction goals, or a control group which received only dietary educational materials [21]. After an average of 7.5 years of follow-up, the following results were seen: Women in the intervention group lost weight in the first year (mean of 2.2 kg) and maintained lower weight than the control women at 7.5 years (difference of 1.9 kg at one year, and 0.4 kg at 7.5 years).  No tendency toward weight gain was seen in the intervention group overall, or when stratified by age, ethnicity, or body mass index.  Weight loss was related to the level of fat intake and was greatest in women who decreased their percentage of energy from fat the most. A similar, but lesser trend was seen with increased vegetable and fruit intake. A low-fat diet can be implemented in two ways. First, the dietitian can provide the subject with specific menu plans that emphasize the use of reduced fat foods. As one guideline, if a food "melts" in your mouth, it probably has fat in it. Second, subjects can be instructed in counting fat grams as an alternative to counting calories. Fat has 9.4 kcal/g. It is thus very easy to calculate the number of grams of fat a subject can eat for  any given level of energy intake. Many experts recommend keeping calories from fat to below 30 percent of total calories. In practical terms, this means eating about 33 g of fat for each 1000 calories in the diet. For simplicity, I use 30 g of fat or less for each 1000 kcal. For a 1500-calorie diet, this would mean about 45 g or less of fat, which can be counted using the nutrition information labels on food  packages. Low-carbohydrate diets - Proponents of low-carbohydrate diets have argued that the increasing obesity epidemic may be in part due to low-fat, high-carbohydrate diets. But this may be dependent upon the type of carbohydrates that are eaten, such as energy dense snacks and sugar or high fructose containing beverages. The carbohydrate content of the diet is an important determinant of short-term (less than two weeks) weight loss. Low (60 to 130 grams of carbohydrates) and very low-carbohydrate diets (0 to <60 grams) have been popular for many years [15]. Restriction of carbohydrates leads to glycogen mobilization and, if carbohydrate intake is less than 50 g/day, ketosis will develop. Rapid weight loss occurs, primarily due to glycogen breakdown and fluid loss rather than fat loss. Low and very low-carbohydrate diets are more effective for short-term weight loss than low-fat diets, although probably not for long-term weight loss. A meta-analysis of five trials found that the difference in weight loss at six months, favoring the low carbohydrate over low fat diet, was not sustained at 12 months [22]. (See 'Comparison trials' below.) Low-carbohydrate diets may have some other beneficial effects with regard to risk of developing type 2 diabetes mellitus, coronary heart disease, and some cancers, particularly if attention is paid to the type as well as the quantity of carbohydrate. A low-carbohydrate diet can be implemented in two ways, either by reducing the total amount of carbohydrate or by consuming foods with a lower glycemic index or glycemic load (table 3). Glycemic index and load are reviewed separately. (See "Dietary carbohydrates", section on 'Glycemic index'.) If a low-carbohydrate diet is chosen, healthy choices for fat (mono- and polyunsaturated fats) and protein (fish, nuts, legumes, and poultry) should be encouraged because of the association between saturated fat intake and risk of coronary  heart disease. During 26 years of follow-up of women in the Nurses' Health Study and 20 years of follow-up of men in the Health Professionals' Follow-up Study, low carbohydrate diets in the highest versus lowest decile for vegetable proteins and fat were associated with lower all-cause mortality (HR 0.80, 95% CI 0.75-0.85) and cardiovascular mortality (HR 0.77, 95% CI 0.68-0.87) [23]. In contrast, low carbohydrate diets in the highest versus lowest decile for animal protein and fat were associated with higher all-cause (HR 1.23, 95% CI 1.11-1.37) and cardiovascular (HR 1.14, 95% CI 1.01-1.29) mortality. (See "Dietary fat" and "Overview of primary prevention of coronary heart disease and stroke", section on 'Healthy diet'.) High protein diets - Some popular books recommend high protein diets [24]. In one trial, low-fat diets with 12 percent and 25 percent protein content were compared. Weight loss over six months was greater with the higher protein diet (9 versus 5 kg), but the difference was no longer significant at 12 and 24 months [25]. Higher protein diets may improve weight maintenance, as illustrated by the results of a study of 60 subjects randomly assigned to a low fat, high protein versus low-fat, high-carbohydrate diet after completing a four week very low calorie diet [26]. Among the subjects who completed the three-month study (n = 48), the  high protein diet group had significantly better weight maintenance (between group difference of 2.3 kg). High dietary protein intake, due to its acid-producing load, increases urinary calcium excretion (with potential risk for bone loss and calcium stone formation) [27]. Urinary calcium excretion does appear to increase when dietary intake of protein increases [27-29], and this could pose a long-term risk for nephrolithiasis. (See "Risk factors for calcium stones in adults", section on 'Dietary risk factors'.) However, two small randomized trials that looked at  bone metabolism found evidence that increased dietary protein may decrease bone resorption [28,29]. One of the trials found that increased intestinal absorption of calcium was primarily responsible for the increased urinary excretion of calcium and that the excreted calcium was not coming from bone [29]. Mediterranean diet - The term Mediterranean diet refers to a dietary pattern that is common in [place] areas of the [place] area. Although there is some variation in Mediterranean diets, there are some common components that include a high level of monounsaturated fat relative to saturated; moderate consumption of alcohol, mainly as wine; a high consumption of vegetables, fruits, legumes, and grains; a moderate consumption of milk and dairy products, mostly in the form of cheese; and a relatively low intake of meat and meat products. A meta-analysis of 12 studies involving eight cohorts found that a Mediterranean diet was associated with improved health status and reductions in overall mortality, cardiovascular mortality, cancer mortality, and incidence of Parkinson's disease and Alzheimer's disease [30]. (See "Healthy diet in adults", section on 'Mediterranean diet'.) Very low-calorie diets - Diets with energy levels between 200 and 800 kcal/day are called "very low-calorie diets," while those below 200 kcal/day can be termed starvation diets. The basis for these diets was the notion that the lower the calorie intake the more rapid the weight loss, because the energy withdrawn from body fat stores is a function of the energy deficit. Starvation is the ultimate very low-calorie diet and results in the most rapid weight loss. Although once popular, starvation diets are now rarely used for treatment of obesity. Very low-calorie diets have not been shown to be superior to conventional diets for long-term weight loss. In a meta-analysis of six trials comparing very low-calorie diets with conventional  low-calorie diets, short-term weight loss was greater with very low-calorie diets (16.1 versus 9.7 versus percent of initial weight), but there was no difference in long-term weight loss (6.3 versus 5.0 percent) [31]. As with all diets, very low-calorie diets initially result in substantial protein loss that diminishes with time. Other expected effects include reduction in blood pressure and improvement in hyperglycemia in diabetic patients. Subjects adhering to very low-calorie diets usually have a fall in blood pressure, especially during the first week. Antihypertensive drugs, especially calcium channel blockers and diuretics, should usually be discontinued when a very low calorie diet is begun unless moderate to severe hypertension is present.  Most diabetic patients eating very low-calorie diets have marked improvement in hyperglycemia. Blood glucose concentrations fall within the first one to two weeks, and remain lower as long as the diet is continued. Those patients taking less than 50 units of insulin or an oral hypoglycemic drug will usually be able to discontinue therapy [32]. The side effects of very low-calorie diets include hair loss, thinning of the skin, and coldness. These diets are contraindicated for lactating and pregnant women, and in children who require protein for linear growth. As with all diets, there is increased cholesterol mobilization from peripheral fat stores, thus increasing the risk of  gallstones. Very low-calorie diets should be reserved for subjects who require rapid weight loss for a specific purpose, such as surgery. The weight regain when the diet is stopped is often rapid, and it is better to take a more sustainable approach than to use a method that cannot be sustained. Comparison trials - The impact of specific dietary composition on weight change remains uncertain. When energy from dietary carbohydrates decreases, energy from fat sources tends to increase. The reverse  is also true; when energy from dietary fats decreases, energy from carbohydrate sources tends to increase. The debate has mainly centered on whether low-fat or low-carbohydrate diets can better induce weight loss and sustain it over the long-term. Weight loss diets - Initial trials evaluating the effect of type of diet (predominantly low-carbohydrate versus low-fat) on weight loss and other outcomes showed that weight loss at six months was approximately 4 kg greater in the very low-carbohydrate group than in the low-fat group [33-35]. Trials lasting for one year, however, did not find a significant difference in weight loss [34,36,37]. A meta-analysis of five trials (including one study not referenced above) found that the difference in weight loss at six months, favoring the low carbohydrate over low fat diet, was not sustained at 12 months [22]. In one study, this convergence was mainly due to regain of weight in the low-carbohydrate group [34]; in another, the convergence was due to ongoing weight loss in the low-fat group (figure 3) [36]. Some of these initial comparison trials of different dietary regimens had important limitations [22]. These included high dropout rates (21 to 48 percent), suboptimal dietary compliance, and limited long-term follow-up. Subsequent trials are larger, of longer duration (lasting one to two years), and have conflicting results with regard to the impact of macronutrient composition on weight loss [38-41]. In contrast, all trials found that dietary adherence is an important determinant of weight loss, independent of macronutrient composition. The following observations illustrate the range of findings in these trials: In one trial, 322 moderately obese subjects (86 percent men) were randomly assigned to a low-fat (restricted calorie), Mediterranean (moderate-fat, restricted calorie, rich in vegetables, low in red meat), or low-carbohydrate (non-restricted-calorie) diet for two  years [38]. Adherence rates were higher than those reported in previous trials (95.4 and 84.6 percent at one and two years, respectively). Weight loss was greater with the Mediterranean and low-carbohydrate diets than the low-fat diet (mean weight loss 4.4, 4.7, and 2.9 kg, respectively).  The most favorable effect on lipids (increased HDL and decreased triglycerides and ratio of total cholesterol to HDL) was seen in the low-carbohydrate group. Among subjects with type 2 diabetes, the greatest improvement in glycemic control occurred with the Mediterranean diet. Among all groups, weight loss was greater for those who completed the two year study than for those who withdrew.  Another randomized trial compared four different diets in 311 overweight and obese premenopausal women: very low-carbohydrate (Atkins); macronutrient balance controlling glycemic load (Zone); general calorie restriction, low-fat (LEARN); and very low-fat (Ornish) [39]. In the intention-to-treat analysis at one year, mean weight loss was greater in the Atkins diet group compared with the other groups (4.7, 1.6, 2.2, and 2.6 kg, respectively). Pairwise comparisons showed a significant difference only for Atkins versus Zone.  The most favorable effect on triglycerides and HDL-C was seen in the Atkins group. Dietary adherence rates (77 to 88 percent) were similar among the groups and better than in previous trials. Within each group, adherence was significantly associated with weight loss [42].  In the largest trial to date, 811 overweight and obese adults were randomly assigned to one of four diets based upon macronutrient content: low or high fat (20 to 40 percent), which provided carbohydrate at 35, 45, 55, or 65 percent, and high or average protein (15 to 25 percent) [40]. After six months, mean weight loss in each group was 6 kg. By two years, mean weight loss was 3 to 4 kg, and weight losses remained similar in all groups. Many  participants had trouble attaining target levels of macronutrients. Subjects who attended the greatest number of group sessions (most adherent) lost the most weight. Thus, any diet that is adhered to will produce modest weight loss, but adherence rates are low with most diets. Although a low-carbohydrate diet may be associated with greater short-term weight loss, superior weight loss in the long-term has not been established. The optimal mix of macronutrients likely depends upon individual factors [43]. A principal determinant of weight loss appears to be the degree of adherence to the diet, irrespective of the particular macronutrient composition [37,39,40,42,44,45]. Thus, we suggest choosing a macronutrient mix based upon patient preferences, which may improve long-term adherence. Behavioral modification to improve dietary compliance with any type of diet may have the greatest impact on long-term weight loss. (See "Behavioral strategies in the treatment of obesity".) Lipids - The observed effects on blood lipids were similar for trials comparing low fat and very low carbohydrate diets [22,33-36,46]; the low-carbohydrate/high-fat diets caused slight increases in HDL, and greater decreases in fasting triglycerides. At 12 to 24 months, however, the favorable effects on HDL persisted [34,36,37,41], while triglyceride levels were either reduced [34,36] or returned to baseline [37]. In a meta-analysis of trials comparing low-carbohydrate and low-fat diet groups, LDL levels were increased in the low-carbohydrate group [22]. There was no clear benefit of either low-fat or low-carbohydrate diet on cardiovascular risks. Favorable changes in HDL cholesterol and triglycerides should be weighed against potential unfavorable changes in LDL cholesterol.  Side effects - Very low-carbohydrate diets may be associated with more frequent side effects than low-fat diets. In one of the trials noted above, a number of symptoms  occurred significantly more frequently in the low-carbohydrate compared to the low-fat diet group [33]. These included constipation (68 versus 35 percent), headache (60 versus 40 percent), halitosis (38 versus 8 percent), muscle cramps (35 versus 7 percent), diarrhea (23 versus 7 percent), general weakness (25 versus 8 percent), and rash (13 versus 0 percent) [33]. Despite the higher rate of symptoms, dropout rates in clinical trials have been similar for low-carbohydrate and low-fat diets [34-36]. Some have raised the concern about ketosis that occurs with very low-carbohydrate diets. There is one case report of an obese patient who presented in severe ketoacidosis, having lost 9 kg in one month on the Atkins diet, with intake restricted to meat, cheese, and salads [47]. Aside from her diet and possible mild dehydration due to gastroenteritis, no other cause for her ketoacidosis was identified. Weight maintenance diets - Although many individuals have success losing weight with diet, most subsequently regain much or all of the lost weight. Maintaining weight loss is made difficult by the reduction in energy expenditure that is induced by weight loss. In addition, long-term adherence to restrictive diets is difficult. Exercise and behavioral interventions may help individuals maintain weight loss. These strategies are reviewed in detail elsewhere. (See "Role of physical activity and exercise in obesity", section on 'Maintenance of weight loss' and "Behavioral strategies in the treatment of obesity", section  on 'Maintenance of weight loss'.) There is little consensus on the optimal mix of macronutrients to maintain weight loss. The satiating effects of high protein, low glycemic index diets have generated interest in manipulating protein composition and glycemic index in weight maintenance diets. (See 'High protein diets' above and "Dietary carbohydrates", section on 'Effect of glycemic index/glycemic load'.) In a  multicenter trial of five ad libitum diets to prevent weight regain over 26 weeks, 773 adults who had successfully lost 8 percent of their body weight on a low calorie diet (800 to 1000 kcal/day), were randomly assigned in a two-by-two factorial design to a high or low-protein (25 versus 13 percent of total calories), high or low-glycemic index, or to a control diet (moderate protein content) [48]. All diets had a moderate fat content (25 to 30 percent). The achieved protein content was 5 percentage points higher in the high versus low protein groups, and the mean glycemic index was five units lower in the low-glycemic versus high-glycemic index groups. In the intention-to-treat analysis, weight regain during the trial was modestly but significantly greater in the low versus high-protein groups (mean difference 0.93 kg) and in the high versus low-glycemic index groups (mean difference 0.95 kg). Only subjects in the high-protein, low-glycemic index diet group continued to lose weight (mean change -0.38 kg). The trial was limited by the moderate dropout rate (29 percent) and short-term follow-up (six months). Whether a low glycemic index, high protein diet is associated with long-term weight maintenance is unknown. As discussed above, long-term adherence to a weight maintaining diet is probably the most important determinant of success, and therefore the optimal weight maintaining diet will depend upon preference and individual factors. Role of dietary counseling - Dietary counseling may produce modest, short-term weight losses. This topic is reviewed in detail elsewhere. (See "Behavioral strategies in the treatment of obesity", section on 'Elements of behavioral strategies' and "Behavioral strategies in the treatment of obesity", section on 'Efficacy'.)  Prolonged caloric restriction and longevity - Prolonged caloric restriction improves longevity in rodents and non-human primates [49], but it is not known if the  same is true in humans. It is hypothesized that the antiaging effects of caloric restriction are due to reduced energy expenditure resulting in a reduction in production of reactive oxygen species (and therefore a reduction in oxidative damage). In addition, other metabolic effects associated with caloric restriction, such as improved insulin sensitivity, might also have an antiaging effect. In one trial of 48 sedentary, overweight men and women, six months of caloric restriction, with or without exercise, resulted in significant weight loss as expected [50]. In addition, calorie restriction-mediated reductions in fasting insulin concentrations, core body temperature, serum T3 levels, and oxidative damage to DNA (as reflected by a reduction in DNA fragmentation) were seen, suggesting a possible antiaging effect of the prolonged caloric restriction. INFORMATION FOR PATIENTS - UpToDate offers two types of patient education materials, "The Basics" and "Beyond the Basics." The Basics patient education pieces are written in plain language, at the 5th to 6th grade reading level, and they answer the four or five key questions a patient might have about a given condition. These articles are best for patients who want a general overview and who prefer short, easy-to-read materials. Beyond the Basics patient education pieces are longer, more sophisticated, and more detailed. These articles are written at the 10th to 12th grade reading level and are best for patients who want in-depth information and are comfortable with some medical jargon. Here are the patient  education articles that are relevant to this topic. We encourage you to print or e-mail these topics to your patients. (You can also locate patient education articles on a variety of subjects by searching on "patient info" and the keyword(s) of interest.)  Basics topics (see "Patient information: Diet and health (The Basics)" and "Patient information: Weight loss  treatments (The Basics)")  Beyond the Basics topics (see "Patient information: Diet and health (Beyond the Basics)" and "Patient information: Weight loss treatments (Beyond the Basics)" and "Patient information: Weight loss surgery (Beyond the Basics)")  SUMMARY AND RECOMMENDATIONS An initial weight loss goal of 5 to 7 percent of body weight is realistic for most individuals. (See 'Goals of weight loss' above.)  Many types of diets produce modest weight loss. Options include balanced low-calorie, low-fat low-calorie, moderate-fat low calorie, low-carbohydrate diets, and the Mediterranean diet. Dietary adherence is an important predictor of weight loss, irrespective of the type of diet. (See 'Types of diets' above.)  We suggest tailoring a diet that reduces energy intake below energy expenditure to individual patient preferences, rather than focusing on the macronutrient composition of the diet (Grade 2B). (See 'Comparison trials' above.)  If a low-carbohydrate diet is chosen, healthy choices for fat (mono and polyunsaturated) and protein (fish, nuts, legumes, and poultry) should be encouraged. If a low-fat diet is chosen, the decrease in fat should be accompanied by increases in healthy carbohydrates (fruits, vegetables, whole grains).    Screening methods for cervical cancer: Joint Recommendations of the American Cancer Society, the American Society ofColposcopy and Cervical Pathology (ASCCP), and the American Society for Clinical Pathology:  Women's age 45-29 years should be tested with cervical cytology alone, and screening should be performed every 3 years. Co-testing should not be performed in women younger than 48 years of age. For women age 62-66 years of age, co-testing with cytology and HPV testing every 5 years is preferred. Screening with cytology alone every 3 years is acceptable.  Women with prior hysterectomy for benign indications (not dysplasia or precancerous cels) do not need screening  pap smear.

## 2011-01-18 LAB — URINE CULTURE: Colony Count: NO GROWTH

## 2011-04-01 ENCOUNTER — Other Ambulatory Visit: Payer: Self-pay | Admitting: Family Medicine

## 2011-10-10 ENCOUNTER — Ambulatory Visit (INDEPENDENT_AMBULATORY_CARE_PROVIDER_SITE_OTHER): Payer: BC Managed Care – PPO | Admitting: Family Medicine

## 2011-10-10 ENCOUNTER — Telehealth: Payer: Self-pay | Admitting: Family Medicine

## 2011-10-10 ENCOUNTER — Encounter: Payer: Self-pay | Admitting: Family Medicine

## 2011-10-10 ENCOUNTER — Ambulatory Visit: Payer: BC Managed Care – PPO | Admitting: Family Medicine

## 2011-10-10 VITALS — BP 150/90 | Temp 98.4°F | Wt 179.0 lb

## 2011-10-10 DIAGNOSIS — K047 Periapical abscess without sinus: Secondary | ICD-10-CM

## 2011-10-10 MED ORDER — AMOXICILLIN 875 MG PO TABS: 875.0000 mg | ORAL_TABLET | Freq: Two times a day (BID) | ORAL | Status: AC

## 2011-10-10 NOTE — Telephone Encounter (Signed)
Hysterectomy~ 2005  Caller: [name]/Patient; Patient Name: Olivia Carr; PCP: [facility]); Best Callback Phone Number: [phone]; Reason for call: Started with fever and ear pain, headache, jaw pain onset- 10/09/11. Last week had increased congestion with colored mucos, increased allergy sx.  L side of jaw swollen, has fractured tooth on that side. Was unable to be worked into dentist's schedule on 10/09/11- will see dentist on Mon 10/13/11. Prescribed Tylenol with Codeine  by dentist on 10/09/11. She left message today for dentist that she is running fever. Has appnt to see Dr. Clent Ridges at 1400 10/10/11.  She took Flonase and Zyrect this morning and helped some with ear pressure. Last had Tylenol with Codiene at 0300. Using hot water bottle to side of face for comfort. Triage and Care advice per Ear Symptoms Protocol and appointment advised within 4 hours for "severe pain(sharp, stabbing, throbbing or excruciating aching) Unresponsive to 24 hours of home care" . Advised that she can take Ibuprofen 2 tabs PO every 6 hours alternating with Tylenol with Codeine 1 tab PO every 6 hours prn. Requesting sooner appointment. 2pm appointment cancelled and given appointment to see Dr. Caryl Never at [phone]- Please check with Dr. Clent Ridges to make sure this is okay and call patient if he would rather her wait till 1400.

## 2011-10-10 NOTE — Progress Notes (Signed)
  Subjective:    Patient ID: Olivia Carr, female    DOB: 12/24/1960, 51 y.o.   MRN: [id number]  HPI  Patient seen with poorly localized pain left facial region. Onset 3 days ago. She went dentist Wedndemesday and she has a crown that has broken off her left lower posterior molar. Last night developed fever 101. Pain left jaw region. Radiation toward that ear. No swallowing difficulties. No sore throat. Prescribed Tylenol with Codeine per dentist and alternating ibuprofen and this which seems to help. No headaches. No cough. No nasal congestion.   Review of Systems  Constitutional: Positive for fever. Negative for unexpected weight change.  HENT: Negative for neck pain and sinus pressure.   Respiratory: Negative for cough and shortness of breath.   Cardiovascular: Negative for chest pain.       Objective:   Physical Exam  Constitutional: She appears well-developed and well-nourished.  HENT:  Right Ear: External ear normal.  Left Ear: External ear normal.  Mouth/Throat: Oropharynx is clear and moist.       Patient has significant gum recession around left posterior molar. Mild gum erythema and edema. No visible purulence.  Neck: Neck supple.  Cardiovascular: Normal rate and regular rhythm.   Pulmonary/Chest: Effort normal and breath sounds normal. No respiratory distress. She has no wheezes. She has no rales.  Lymphadenopathy:    She has no cervical adenopathy.  Skin: No rash noted.          Assessment & Plan:  Probable dental abscess. Amoxicillin 875 mg twice a day. Patient has scheduled followup with dentist on Monday. She already has Tylenol with Codeine to take for pain

## 2011-10-10 NOTE — Telephone Encounter (Signed)
Pt seeing Dr. Caryl Never at 9:45 on 10/10/11.

## 2011-10-14 ENCOUNTER — Ambulatory Visit: Payer: BC Managed Care – PPO | Admitting: Family Medicine

## 2011-10-14 ENCOUNTER — Telehealth: Payer: Self-pay | Admitting: Family Medicine

## 2011-10-14 NOTE — Telephone Encounter (Signed)
Caller: Jamani/Patient; Patient Name: Olivia Carr; PCP: Gershon Crane [facility]); Best Callback Phone Number: [phone]; Reason for call: Swelling in the left jaw approx the size of a golf ball since 10/11/11.  Pt unable to open and close her mouth due to the pain and swelling. No respiratory or swallowing complications.  Pt was seen on 10/10/11 and prescribed Amoxicillin.  She has had 10 doses of Amoxicillin 800mg  and is not getting relief.  Pt saw the Dentist yesterday had the crown removed and he administered an antibiotic directly to the root.  Pt taking Ibuprofen and Tyelnol with codeine for pain relief.  Febrile 100.2 tympanic 10/14/11. Urgent symptoms positive due to  'Unable to open or close mouthfully''  per Jaw Symptoms protocol.  Override See ED immediatlely to See Provider within 4 hrs.  Appt scheduled with Dr. Clent Ridges at 1400.  Pt is going to call Dentist to see if she should go to a specialist.

## 2011-10-17 ENCOUNTER — Other Ambulatory Visit: Payer: Self-pay | Admitting: Family Medicine

## 2011-11-10 ENCOUNTER — Other Ambulatory Visit (INDEPENDENT_AMBULATORY_CARE_PROVIDER_SITE_OTHER): Payer: BC Managed Care – PPO

## 2011-11-10 DIAGNOSIS — Z0000 Encounter for general adult medical examination without abnormal findings: Secondary | ICD-10-CM

## 2011-11-10 LAB — LIPID PANEL
HDL: 55.5 mg/dL (ref >39.00)
LDL Cholesterol: 107 mg/dL — ABNORMAL HIGH (ref 0–99)
Total CHOL/HDL Ratio: 3
Triglycerides: 89 mg/dL (ref 0.0–149.0)

## 2011-11-10 LAB — CBC WITH DIFFERENTIAL/PLATELET
Basophils Relative: 0.7 % (ref 0.0–3.0)
Eosinophils Relative: 3.3 % (ref 0.0–5.0)
Hemoglobin: 13.5 g/dL (ref 12.0–15.0)
Lymphocytes Relative: 27.3 % (ref 12.0–46.0)
MCV: 91.8 fl (ref 78.0–100.0)
Monocytes Absolute: 0.4 10*3/uL (ref 0.1–1.0)
Neutro Abs: 3.5 10*3/uL (ref 1.4–7.7)
Neutrophils Relative %: 61 % (ref 43.0–77.0)
RBC: 4.5 Mil/uL (ref 3.87–5.11)
WBC: 5.8 10*3/uL (ref 4.5–10.5)

## 2011-11-10 LAB — POCT URINALYSIS DIPSTICK
Glucose, UA: NEGATIVE
Leukocytes, UA: NEGATIVE
Nitrite, UA: NEGATIVE
Urobilinogen, UA: 0.2

## 2011-11-10 LAB — BASIC METABOLIC PANEL
Chloride: 109 mEq/L (ref 96–112)
Creatinine, Ser: 0.7 mg/dL (ref 0.4–1.2)
Sodium: 143 mEq/L (ref 135–145)

## 2011-11-10 LAB — HEPATIC FUNCTION PANEL: Albumin: 3.4 g/dL — ABNORMAL LOW (ref 3.5–5.2)

## 2011-11-10 LAB — TSH: TSH: 1.11 u[IU]/mL (ref 0.35–5.50)

## 2011-11-13 NOTE — Progress Notes (Signed)
Quick Note:  I left voice message with results. ______ 

## 2011-11-17 ENCOUNTER — Ambulatory Visit (INDEPENDENT_AMBULATORY_CARE_PROVIDER_SITE_OTHER): Payer: BC Managed Care – PPO | Admitting: Family Medicine

## 2011-11-17 ENCOUNTER — Encounter: Payer: Self-pay | Admitting: Family Medicine

## 2011-11-17 VITALS — BP 128/84 | HR 60 | Temp 97.7°F | Ht 65.5 in | Wt 175.0 lb

## 2011-11-17 DIAGNOSIS — Z23 Encounter for immunization: Secondary | ICD-10-CM

## 2011-11-17 DIAGNOSIS — Z0000 Encounter for general adult medical examination without abnormal findings: Secondary | ICD-10-CM

## 2011-11-17 NOTE — Progress Notes (Signed)
  Subjective:    Patient ID: Olivia Carr, female    DOB: 03-18-1960, 51 y.o.   MRN: [id number]  HPI 51 yr old female for a cpx. She feels great and has no concerns. She was seen here last month for an abscessed tooth. She had a root canal and this has cleared up.    Review of Systems  Constitutional: Negative.   HENT: Negative.   Eyes: Negative.   Respiratory: Negative.   Cardiovascular: Negative.   Gastrointestinal: Negative.   Genitourinary: Negative for dysuria, urgency, frequency, hematuria, flank pain, decreased urine volume, enuresis, difficulty urinating, pelvic pain and dyspareunia.  Musculoskeletal: Negative.   Skin: Negative.   Neurological: Negative.   Hematological: Negative.   Psychiatric/Behavioral: Negative.        Objective:   Physical Exam  Constitutional: She is oriented to person, place, and time. She appears well-developed and well-nourished. No distress.  HENT:  Head: Normocephalic and atraumatic.  Right Ear: External ear normal.  Left Ear: External ear normal.  Nose: Nose normal.  Mouth/Throat: Oropharynx is clear and moist. No oropharyngeal exudate.  Eyes: Conjunctivae normal and EOM are normal. Pupils are equal, round, and reactive to light. No scleral icterus.  Neck: Normal range of motion. Neck supple. No JVD present. No thyromegaly present.  Cardiovascular: Normal rate, regular rhythm, normal heart sounds and intact distal pulses.  Exam reveals no gallop and no friction rub.   No murmur heard. Pulmonary/Chest: Effort normal and breath sounds normal. No respiratory distress. She has no wheezes. She has no rales. She exhibits no tenderness.  Abdominal: Soft. Bowel sounds are normal. She exhibits no distension and no mass. There is no tenderness. There is no rebound and no guarding.  Musculoskeletal: Normal range of motion. She exhibits no edema and no tenderness.  Lymphadenopathy:    She has no cervical adenopathy.  Neurological: She is alert  and oriented to person, place, and time. She has normal reflexes. No cranial nerve deficit. She exhibits normal muscle tone. Coordination normal.  Skin: Skin is warm and dry. No rash noted. No erythema.  Psychiatric: She has a normal mood and affect. Her behavior is normal. Judgment and thought content normal.          Assessment & Plan:  Well exam.

## 2011-11-29 ENCOUNTER — Other Ambulatory Visit: Payer: Self-pay | Admitting: Family Medicine

## 2011-12-04 ENCOUNTER — Other Ambulatory Visit: Payer: Self-pay | Admitting: Gynecology

## 2011-12-04 DIAGNOSIS — Z1231 Encounter for screening mammogram for malignant neoplasm of breast: Secondary | ICD-10-CM

## 2012-01-02 ENCOUNTER — Other Ambulatory Visit: Payer: Self-pay | Admitting: Family Medicine

## 2012-01-02 NOTE — Telephone Encounter (Signed)
Patient is calling about refill for Klor-con potassium. 10 meq tablet daily.  She has been trying to get a refill from her mail order pharmacy. They state they have sent request but it has not been renewed. Patient LOV 11/17/11.  Please send refill to mail order pharmacy /90 day supply.  [facility].

## 2012-01-02 NOTE — Telephone Encounter (Signed)
This was sent in today.  

## 2012-01-12 ENCOUNTER — Ambulatory Visit (HOSPITAL_COMMUNITY): Payer: BC Managed Care – PPO

## 2012-01-15 ENCOUNTER — Ambulatory Visit (HOSPITAL_COMMUNITY)
Admission: RE | Admit: 2012-01-15 | Discharge: 2012-01-15 | Disposition: A | Discharge status: Home / Self Care | Payer: BC Managed Care – PPO | Source: Ambulatory Visit | Attending: Gynecology | Admitting: Gynecology

## 2012-01-15 DIAGNOSIS — Z1231 Encounter for screening mammogram for malignant neoplasm of breast: Secondary | ICD-10-CM

## 2012-01-19 ENCOUNTER — Ambulatory Visit (INDEPENDENT_AMBULATORY_CARE_PROVIDER_SITE_OTHER): Payer: BC Managed Care – PPO | Admitting: Gynecology

## 2012-01-19 ENCOUNTER — Encounter: Payer: Self-pay | Admitting: Gynecology

## 2012-01-19 VITALS — BP 136/88 | Ht 65.25 in | Wt 181.0 lb

## 2012-01-19 DIAGNOSIS — N951 Menopausal and female climacteric states: Secondary | ICD-10-CM

## 2012-01-19 DIAGNOSIS — Z01419 Encounter for gynecological examination (general) (routine) without abnormal findings: Secondary | ICD-10-CM

## 2012-01-19 DIAGNOSIS — R635 Abnormal weight gain: Secondary | ICD-10-CM

## 2012-01-19 LAB — FOLLICLE STIMULATING HORMONE: FSH: 128.2 m[IU]/mL — ABNORMAL HIGH

## 2012-01-19 NOTE — Patient Instructions (Signed)
Perimenopause Perimenopause is the time when your body begins to move into the menopause (no menstrual period for 12 straight months). It is a natural process. Perimenopause can begin 2 to 8 years before the menopause and usually lasts for one year after the menopause. During this time, your ovaries may or may not produce an egg. The ovaries vary in their production of estrogen and progesterone hormones each month. This can cause irregular menstrual periods, difficulty in getting pregnant, vaginal bleeding between periods and uncomfortable symptoms. CAUSES  Irregular production of the ovarian hormones, estrogen and progesterone, and not ovulating every month.  Other causes include:  Tumor of the pituitary gland in the brain.  Medical disease that affects the ovaries.  Radiation treatment.  Chemotherapy.  Unknown causes.  Heavy smoking and excessive alcohol intake can bring on perimenopause sooner. SYMPTOMS   Hot flashes.  Night sweats.  Irregular menstrual periods.  Decrease sex drive.  Vaginal dryness.  Headaches.  Mood swings.  Depression.  Memory problems.  Irritability.  Tiredness.  Weight gain.  Trouble getting pregnant.  The beginning of losing bone cells (osteoporosis).  The beginning of hardening of the arteries (atherosclerosis). DIAGNOSIS  Your caregiver will make a diagnosis by analyzing your age, menstrual history and your symptoms. They will do a physical exam noting any changes in your body, especially your female organs. Female hormone tests may or may not be helpful depending on the amount and when you produce the female hormones. However, other hormone tests may be helpful (ex. thyroid hormone) to rule out other problems. TREATMENT  The decision to treat during the perimenopause should be made by you and your caregiver depending on how the symptoms are affecting you and your life style. There are various treatments available such as:  Treating  individual symptoms with a specific medication for that symptom (ex. tranquilizer for depression).  Herbal medications that can help specific symptoms.  Counseling.  Group therapy.  No treatment. HOME CARE INSTRUCTIONS   Before seeing your caregiver, make a list of your menstrual periods (when the occur, how heavy they are, how long between periods and how long they last), your symptoms and when they started.  Take the medication as recommended by your caregiver.  Sleep and rest.  Exercise.  Eat a diet that contains calcium (good for your bones) and soy (acts like estrogen hormone).  Do not smoke.  Avoid alcoholic beverages.  Taking vitamin E may help in certain cases.  Take calcium and vitamin D supplements to help prevent bone loss.  Group therapy is sometimes helpful.  Acupuncture may help in some cases. SEEK MEDICAL CARE IF:   You have any of the above and want to know if it is perimenopause.  You want advice and treatment for any of your symptoms mentioned above.  You need a referral to a specialist (gynecologist, psychiatrist or psychologist). SEEK IMMEDIATE MEDICAL CARE IF:   You have vaginal bleeding.  Your period lasts longer than 8 days.  You periods are recurring sooner than 21 days.  You have bleeding after intercourse.  You have severe depression.  You have pain when you urinate.  You have severe headaches.  You develop vision problems. Document Released: 02/21/2004 Document Revised: 04/07/2011 Document Reviewed: 11/11/2007 Exit[employer] Patient Information 2013 [employer].  Hormone Therapy At menopause, your body begins making less estrogen and progesterone hormones. This causes the body to stop having menstrual periods. This is because estrogen and progesterone hormones control your periods and menstrual cycle.   A lack of estrogen may cause symptoms such as:  Hot flushes (or hot flashes).  Vaginal dryness.  Dry skin.  Loss of sex  drive.  Risk of bone loss (osteoporosis). When this happens, you may choose to take hormone therapy to get back the estrogen lost during menopause. When the hormone estrogen is given alone, it is usually referred to as ET (Estrogen Therapy). When the hormone progestin is combined with estrogen, it is generally called HT (Hormone Therapy). This was formerly known as hormone replacement therapy (HRT). Your caregiver can help you make a decision on what will be best for you. The decision to use HT seems to change often as new studies are done. Many studies do not agree on the benefits of hormone replacement therapy. LIKELY BENEFITS OF HT INCLUDE PROTECTION FROM:  Hot Flushes (also called hot flashes) - A hot flush is a sudden feeling of heat that spreads over the face and body. The skin may redden like a blush. It is connected with sweats and sleep disturbance. Women going through menopause may have hot flushes a few times a month or several times per day depending on the woman.  Osteoporosis (bone loss)- Estrogen helps guard against bone loss. After menopause, a woman's bones slowly lose calcium and become weak and brittle. As a result, bones are more likely to break. The hip, wrist, and spine are affected most often. Hormone therapy can help slow bone loss after menopause. Weight bearing exercise and taking calcium with vitamin D also can help prevent bone loss. There are also medications that your caregiver can prescribe that can help prevent osteoporosis.  Vaginal Dryness - Loss of estrogen causes changes in the vagina. Its lining may become thin and dry. These changes can cause pain and bleeding during sexual intercourse. Dryness can also lead to infections. This can cause burning and itching. (Vaginal estrogen treatment can help relieve pain, itching, and dryness.)  Urinary Tract Infections are more common after menopause because of lack of estrogen. Some women also develop urinary incontinence  because of low estrogen levels in the vagina and bladder.  Possible other benefits of estrogen include a positive effect on mood and short-term memory in women. RISKS AND COMPLICATIONS  Using estrogen alone without progesterone causes the lining of the uterus to grow. This increases the risk of lining of the uterus (endometrial) cancer. Your caregiver should give another hormone called progestin if you have a uterus.  Women who take combined (estrogen and progestin) HT appear to have an increased risk of breast cancer. The risk appears to be small, but increases throughout the time that HT is taken.  Combined therapy also makes the breast tissue slightly denser which makes it harder to read mammograms (breast X-rays).  Combined, estrogen and progesterone therapy can be taken together every day, in which case there may be spotting of blood. HT therapy can be taken cyclically in which case you will have menstrual periods. Cyclically means HT is taken for a set amount of days, then not taken, then this process is repeated.  HT may increase the risk of stroke, heart attack, breast cancer and forming blood clots in your leg.  Transdermal estrogen (estrogen that is absorbed through the skin with a patch or a cream) may have more positive results with:  Cholesterol.  Blood pressure.  Blood clots. Having the following conditions may indicate you should not have HT:  Endometrial cancer.  Liver disease.  Breast cancer.  Heart disease.  History of   blood clots.  Stroke. TREATMENT   If you choose to take HT and have a uterus, usually estrogen and progestin are prescribed.  Your caregiver will help you decide the best way to take the medications.  Possible ways to take estrogen include:  Pills.  Patches.  Gels.  Sprays.  Vaginal estrogen cream, rings and tablets.  It is best to take the lowest dose possible that will help your symptoms and take them for the shortest period of  time that you can.  Hormone therapy can help relieve some of the problems (symptoms) that affect women at menopause. Before making a decision about HT, talk to your caregiver about what is best for you. Be well informed and comfortable with your decisions. HOME CARE INSTRUCTIONS   Follow your caregivers advice when taking the medications.  A Pap test is done to screen for cervical cancer.  The first Pap test should be done at age 21.  Between ages 21 and 29, Pap tests are repeated every 2 years.  Beginning at age 30, you are advised to have a Pap test every 3 years as long as your past 3 Pap tests have been normal.  Some women have medical problems that increase the chance of getting cervical cancer. Talk to your caregiver about these problems. It is especially important to talk to your caregiver if a new problem develops soon after your last Pap test. In these cases, your caregiver may recommend more frequent screening and Pap tests.  The above recommendations are the same for women who have or have not gotten the vaccine for HPV (Human Papillomavirus).  If you had a hysterectomy for a problem that was not a cancer or a condition that could lead to cancer, then you no longer need Pap tests. However, even if you no longer need a Pap test, a regular exam is a good idea to make sure no other problems are starting.   If you are between ages 65 and 70, and you have had normal Pap tests going back 10 years, you no longer need Pap tests. However, even if you no longer need a Pap test, a regular exam is a good idea to make sure no other problems are starting.   If you have had past treatment for cervical cancer or a condition that could lead to cancer, you need Pap tests and screening for cancer for at least 20 years after your treatment.  If Pap tests have been discontinued, risk factors (such as a new sexual partner) need to be re-assessed to determine if screening should be  resumed.  Some women may need screenings more often if they are at high risk for cervical cancer.  Get mammograms done as per the advice of your caregiver. SEEK IMMEDIATE MEDICAL CARE IF:  You develop abnormal vaginal bleeding.  You have pain or swelling in your legs, shortness of breath, or chest pain.  You develop dizziness or headaches.  You have lumps or changes in your breasts or armpits.  You have slurred speech.  You develop weakness or numbness of your arms or legs.  You have pain, burning, or bleeding when urinating.  You develop abdominal pain. Document Released: 10/12/2002 Document Revised: 04/07/2011 Document Reviewed: 01/30/2010 [employer] Patient Information 2013 [employer].   

## 2012-01-19 NOTE — Progress Notes (Signed)
Olivia Carr 13-Jul-1960 [id number]   History:    51 y.o.  for annual gyn exam with the only complaint has been sporadic hot flashes but not on a regular basis. Patient with prior history of LAVH. Patient occasionally suffer from insomnia. Dr. Clent Ridges is her primary physician and her lab work was done this year. Her mammogram was December this year which was normal. Patient does her monthly self breast examination. Review of her record indicated she was weighing 169 is up to 181. Her flu shot and Tdap vaccine are up-to-date.  Past medical history,surgical history, family history and social history were all reviewed and documented in the EPIC chart.  Gynecologic History No LMP recorded. Patient has had a hysterectomy. Contraception: status post hysterectomy Last Pap: 2012. Results were: normal Last mammogram: 2013. Results were: normal  Obstetric History OB History    Grav Para Term Preterm Abortions TAB SAB Ect Mult Living   3 2 2  1  1   2      # Outc Date GA Lbr Len/2nd Wgt Sex Del Anes PTL Lv   1 TRM     F SVD  No Yes   2 TRM     F SVD  No Yes   3 SAB                ROS: A ROS was performed and pertinent positives and negatives are included in the history.  GENERAL: No fevers or chills. HEENT: No change in vision, no earache, sore throat or sinus congestion. NECK: No pain or stiffness. CARDIOVASCULAR: No chest pain or pressure. No palpitations. PULMONARY: No shortness of breath, cough or wheeze. GASTROINTESTINAL: No abdominal pain, nausea, vomiting or diarrhea, melena or bright red blood per rectum. GENITOURINARY: No urinary frequency, urgency, hesitancy or dysuria. MUSCULOSKELETAL: No joint or muscle pain, no back pain, no recent trauma. DERMATOLOGIC: No rash, no itching, no lesions. ENDOCRINE: No polyuria, polydipsia, no heat or cold intolerance. No recent change in weight. HEMATOLOGICAL: No anemia or easy bruising or bleeding. NEUROLOGIC: No headache, seizures, numbness, tingling or  weakness. PSYCHIATRIC: No depression, no loss of interest in normal activity or change in sleep pattern.     Exam: chaperone present  BP 136/88  Ht 5' 5.25" (1.657 m)  Wt 181 lb (82.101 kg)  BMI 29.89 kg/m2  Body mass index is 29.89 kg/(m^2).  General appearance : Well developed well nourished female. No acute distress HEENT: Neck supple, trachea midline, no carotid bruits, no thyroidmegaly Lungs: Clear to auscultation, no rhonchi or wheezes, or rib retractions  Heart: Regular rate and rhythm, no murmurs or gallops Breast:Examined in sitting and supine position were symmetrical in appearance, no palpable masses or tenderness,  no skin retraction, no nipple inversion, no nipple discharge, no skin discoloration, no axillary or supraclavicular lymphadenopathy Abdomen: no palpable masses or tenderness, no rebound or guarding Extremities: no edema or skin discoloration or tenderness  Pelvic:  Bartholin, Urethra, Skene Glands: Within normal limits             Vagina: No gross lesions or discharge  Cervix: Absent Uterus absent  Adnexa  Without masses or tenderness  Anus and perineum  normal   Rectovaginal  normal sphincter tone without palpated masses or tenderness             Hemoccult not done     Assessment/Plan:  51 y.o. female for annual exam with only complaint of perimenopausal like symptoms. We are going to check her [facility]  and TSH today. Literature information on the peri-menopause as well as hormone replacement therapy was provided. We discussed importance of exercise and calcium and vitamin D for osteoporosis prevention. Patient with no prior abnormal Pap smears. New screening guidelines discussed. No Pap smear done today.    Ok Edwards MD, 11:29 AM 01/19/2012

## 2012-01-20 ENCOUNTER — Encounter: Payer: Self-pay | Admitting: *Deleted

## 2012-02-14 ENCOUNTER — Encounter (HOSPITAL_COMMUNITY): Payer: Self-pay | Admitting: Adult Health

## 2012-02-14 ENCOUNTER — Other Ambulatory Visit: Payer: Self-pay

## 2012-02-14 ENCOUNTER — Emergency Department (HOSPITAL_COMMUNITY)
Admission: EM | Admit: 2012-02-14 | Discharge: 2012-02-14 | Disposition: A | Discharge status: Home / Self Care | Payer: BC Managed Care – PPO | Attending: Emergency Medicine | Admitting: Emergency Medicine

## 2012-02-14 ENCOUNTER — Emergency Department (HOSPITAL_COMMUNITY): Payer: BC Managed Care – PPO

## 2012-02-14 DIAGNOSIS — Z79899 Other long term (current) drug therapy: Secondary | ICD-10-CM | POA: Insufficient documentation

## 2012-02-14 DIAGNOSIS — J209 Acute bronchitis, unspecified: Secondary | ICD-10-CM | POA: Insufficient documentation

## 2012-02-14 DIAGNOSIS — J45909 Unspecified asthma, uncomplicated: Secondary | ICD-10-CM | POA: Insufficient documentation

## 2012-02-14 DIAGNOSIS — Z8742 Personal history of other diseases of the female genital tract: Secondary | ICD-10-CM | POA: Insufficient documentation

## 2012-02-14 DIAGNOSIS — D497 Neoplasm of unspecified behavior of endocrine glands and other parts of nervous system: Secondary | ICD-10-CM | POA: Insufficient documentation

## 2012-02-14 DIAGNOSIS — Z8701 Personal history of pneumonia (recurrent): Secondary | ICD-10-CM | POA: Insufficient documentation

## 2012-02-14 DIAGNOSIS — R059 Cough, unspecified: Secondary | ICD-10-CM | POA: Insufficient documentation

## 2012-02-14 DIAGNOSIS — I10 Essential (primary) hypertension: Secondary | ICD-10-CM | POA: Insufficient documentation

## 2012-02-14 DIAGNOSIS — Z8709 Personal history of other diseases of the respiratory system: Secondary | ICD-10-CM | POA: Insufficient documentation

## 2012-02-14 DIAGNOSIS — R05 Cough: Secondary | ICD-10-CM | POA: Insufficient documentation

## 2012-02-14 DIAGNOSIS — R062 Wheezing: Secondary | ICD-10-CM | POA: Insufficient documentation

## 2012-02-14 DIAGNOSIS — Z8719 Personal history of other diseases of the digestive system: Secondary | ICD-10-CM | POA: Insufficient documentation

## 2012-02-14 MED ORDER — PREDNISONE 20 MG PO TABS: 40.0000 mg | ORAL_TABLET | Freq: Every day | ORAL | Status: DC

## 2012-02-14 MED ORDER — PREDNISONE 20 MG PO TABS
60.0000 mg | ORAL_TABLET | Freq: Once | ORAL | Status: AC
  Administered 2012-02-14: 60 mg via ORAL
  Filled 2012-02-14: qty 3

## 2012-02-14 MED ORDER — BENZONATATE 100 MG PO CAPS: 200.0000 mg | ORAL_CAPSULE | Freq: Two times a day (BID) | ORAL | Status: DC | PRN

## 2012-02-14 MED ORDER — AEROCHAMBER PLUS W/MASK MISC
Status: AC
  Administered 2012-02-14: 1
  Filled 2012-02-14: qty 1

## 2012-02-14 MED ORDER — ALBUTEROL SULFATE (5 MG/ML) 0.5% IN NEBU
5.0000 mg | INHALATION_SOLUTION | Freq: Once | RESPIRATORY_TRACT | Status: AC
  Administered 2012-02-14: 5 mg via RESPIRATORY_TRACT

## 2012-02-14 MED ORDER — ALBUTEROL SULFATE HFA 108 (90 BASE) MCG/ACT IN AERS
2.0000 | INHALATION_SPRAY | Freq: Once | RESPIRATORY_TRACT | Status: AC
  Administered 2012-02-14: 2 via RESPIRATORY_TRACT
  Filled 2012-02-14: qty 6.7

## 2012-02-14 MED ORDER — AEROCHAMBER PLUS FLO-VU MEDIUM MISC
1.0000 | Freq: Once | Status: AC
  Administered 2012-02-14: 1
  Filled 2012-02-14: qty 1

## 2012-02-14 NOTE — ED Provider Notes (Signed)
History     CSN: [id number]  Arrival date & time 02/14/12  0037   First MD Initiated Contact with Patient 02/14/12 [phone]      Chief Complaint  Patient presents with  . Shortness of Breath    (Consider location/radiation/quality/duration/timing/severity/associated sxs/prior treatment) HPI Comments: Mrs. Olivia Carr presents for evaluation of coughing, wheezing, and SOB.  She has a history of environmental and seasonal allergies and reports an exacerbation beginning about 1.5 wks ago secondary to an unknown trigger.  The nasal congestion has resolved however she continues to have a cough.  She saw her PM<D and he noticed she was wheezing.  She was prescribed a prednisone pulse and xopenex to be used in conjunction with the zyrtec, flonase, and advair she already takes.  Tonight, the symptoms worsened again.  She denies fever, sore throat, nasal drainage, chest pain, NVD, and abdominal pain.  Patient is a 52 y.o. female presenting with shortness of breath. The history is provided by the patient. No language interpreter was used.  Shortness of Breath  The current episode started 5 to 7 days ago. The problem occurs occasionally. The problem has been gradually improving. The problem is mild. The symptoms are relieved by beta-agonist inhalers. Associated symptoms include cough, shortness of breath and wheezing. Pertinent negatives include no chest pain, no chest pressure, no orthopnea, no fever, no rhinorrhea, no sore throat and no stridor. There was no intake of a foreign body. She has not inhaled smoke recently. Steroid use: just completed a course of prednisone. She has had no prior hospitalizations. She has had no prior ICU admissions. She has had no prior intubations. Urine output has been normal. There were no sick contacts. Recently, medical care has been given by the PCP. Services received include medications given and tests performed.    Past Medical History  Diagnosis Date  . GERD  (gastroesophageal reflux disease)   . Pneumonia   . Allergic rhinitis   . Benign tumor of pineal gland     cyst last MRI 09/19/04 5 yr follow up planned  . Hypertension   . DUB (dysfunctional uterine bleeding)   . Asthma   . NSVD (normal spontaneous vaginal delivery)     X2  . Routine gynecological examination     sees Dr. Reynaldo Minium     Past Surgical History  Procedure Date  . Tonsillectomy   . Dilation and curettage of uterus     x 2  . Wisdom tooth extraction   . Anterior cruciate ligament repair     right knee with medial and lateral meniscectomies 09/04/08  . Abdominal hysterectomy     LAVH  . Myomectomy abdominal approach     Family History  Problem Relation Age of Onset  . Other      bowel disease  . Diabetes    . Hypertension    . Coronary artery disease    . Hypertension Mother   . Cancer Father 73    PANCREATIC  . Heart disease Maternal Grandmother   . Cancer Maternal Grandfather     COLON  . Heart disease Paternal Grandmother   . Heart disease Paternal Grandfather     History  Substance Use Topics  . Smoking status: Never Smoker   . Smokeless tobacco: Never Used  . Alcohol Use: 3.5 oz/week    7 drink(s) per week    OB History    Grav Para Term Preterm Abortions TAB SAB Ect Mult Living   3 2 2  1  1   2       Review of Systems  Constitutional: Negative for fever.  HENT: Negative for sore throat and rhinorrhea.   Respiratory: Positive for cough, shortness of breath and wheezing. Negative for stridor.   Cardiovascular: Negative for chest pain and orthopnea.  All other systems reviewed and are negative.    Allergies  Nitrofurantoin; Prochlorperazine edisylate; and Sulfonamide derivatives  Home Medications   Current Outpatient Rx  Name  Route  Sig  Dispense  Refill  . AZITHROMYCIN 250 MG PO TABS   Oral   Take 250 mg by mouth daily.         Marland Kitchen CALCIUM CARBONATE 600 MG PO TABS   Oral   Take 600 mg by mouth 2 (two) times daily  with a meal.           . CALCIUM CARBONATE ANTACID 750 MG PO CHEW   Oral   Chew 1 tablet by mouth daily.         Marland Kitchen CETIRIZINE HCL 10 MG PO TABS   Oral   Take 10 mg by mouth daily.           Marland Kitchen HYDROCOD POLST-CPM POLST ER 10-8 MG/5ML PO LQCR   Oral   Take 5 mLs by mouth every 12 (twelve) hours.         Marland Kitchen FLUTICASONE PROPIONATE 50 MCG/ACT NA SUSP      USE 2 SPRAYS IN EACH       NOSTRIL DAILY   48 g   3   . ADVAIR DISKUS IN   Inhalation   Inhale 1 puff into the lungs 2 (two) times daily.         . GUAIFENESIN ER 600 MG PO TB12   Oral   Take 1,200 mg by mouth 2 (two) times daily.         . IBUPROFEN 200 MG PO TABS   Oral   Take 200 mg by mouth every 6 (six) hours as needed. For pain         . KLOR-CON M10 10 MEQ PO TBCR      TAKE 1 TABLET DAILY   90 each   1   . LEVALBUTEROL TARTRATE 45 MCG/ACT IN AERO   Inhalation   Inhale 1-2 puffs into the lungs every 4 (four) hours as needed for wheezing or shortness of breath.   3 Inhaler   3   . METOPROLOL SUCCINATE ER 25 MG PO TB24      TAKE 1 TABLET DAILY   90 tablet   3   . NEXIUM 40 MG PO CPDR      TAKE 1 CAPSULE DAILY   90 each   3   . PREDNISONE 10 MG PO TABS   Oral   Take 10 mg by mouth daily.           BP 111/79  Pulse 94  Temp 97.9 F (36.6 C) (Oral)  Resp 20  SpO2 97%  Physical Exam  Nursing note and vitals reviewed. Constitutional: She is oriented to person, place, and time. She appears well-developed and well-nourished. No distress.  HENT:  Head: Normocephalic and atraumatic.  Right Ear: External ear normal.  Left Ear: External ear normal.  Mouth/Throat: Oropharynx is clear and moist. No oropharyngeal exudate.  Eyes: Conjunctivae normal are normal. Pupils are equal, round, and reactive to light. Right eye exhibits no discharge. Left eye exhibits no discharge. No scleral icterus.  Neck: Normal range  of motion. Neck supple. No JVD present. No tracheal deviation present.    Cardiovascular: Normal rate, regular rhythm, normal heart sounds and intact distal pulses.  Exam reveals no gallop and no friction rub.   No murmur heard. Pulmonary/Chest: Effort normal. No stridor. No respiratory distress. She has wheezes (mild bilat). She has no rales. She exhibits no tenderness.       Note a harsh, nonproductive cough   Abdominal: Soft. Bowel sounds are normal. She exhibits no distension and no mass. There is no tenderness. There is no rebound and no guarding.  Musculoskeletal: Normal range of motion. She exhibits no edema and no tenderness.  Lymphadenopathy:    She has no cervical adenopathy.  Neurological: She is alert and oriented to person, place, and time. Coordination (nl gait) normal.  Skin: Skin is warm and dry. No rash noted. She is not diaphoretic. No erythema. No pallor.  Psychiatric: She has a normal mood and affect. Her behavior is normal.    ED Course  Procedures (including critical care time)  Labs Reviewed - No data to display Dg Chest 2 View (if Patient Has Fever And/or Copd)  02/14/2012  *RADIOLOGY REPORT*  Clinical Data: Shortness of breath and cough.  CHEST - 2 VIEW  Comparison: None  Findings: The cardiomediastinal silhouette is unremarkable. The lungs are clear. There is no evidence of focal airspace disease, pulmonary edema, suspicious pulmonary nodule/mass, pleural effusion, or pneumothorax. No acute bony abnormalities are identified.  IMPRESSION: No evidence of acute cardiopulmonary disease.   Original Report Authenticated By: Harmon Pier, M.D.      No diagnosis found.    MDM  Pt present for evaluation of wheezing and SOB.  She appears nontoxic, note stable VS, NAD.  She has some continued wheezing on exam.  She has no hx of asthma or COPD but has experienced bronchospasm secondary to seasonal/environmental alleargies.  There is no infiltrate on CXR and she is afebrile.  She completed a prednisone pulse from her PMD without resolution of the  symptoms.  Will prescribe a 2nd prednisone taper as well as albuterol to be used Q4 hrs over the next 3-4 days.  She was prescribed a course of augmentin.  She has no clinical evidence of sinusitis, pharyngitis, or pneumonia.  At this point, I do not feel that she would benefit from the antibiotic.  Encouraged close outpt follow-up.        Tobin Chad, MD 02/14/12 [phone]

## 2012-02-14 NOTE — ED Notes (Signed)
Dr Powers at bedside.

## 2012-02-14 NOTE — ED Notes (Addendum)
Reports non productive since Thursday of last week 02/05/2012, went to Dr. On Monday and placed on steroid, advair and augmentin, zyrtec and tussinex. This evening cough, SOB and wheezing is worse, states she is unable to catch her breath. Reports right sided chest tightness and pain, bilateral expiratory and inspiratory wheezes.

## 2012-07-20 ENCOUNTER — Other Ambulatory Visit: Payer: Self-pay | Admitting: Family Medicine

## 2012-08-24 ENCOUNTER — Other Ambulatory Visit: Payer: Self-pay | Admitting: Family Medicine

## 2012-10-25 ENCOUNTER — Other Ambulatory Visit: Payer: Self-pay | Admitting: Family Medicine

## 2012-10-25 NOTE — Telephone Encounter (Signed)
Refill all three for one year each

## 2012-10-25 NOTE — Telephone Encounter (Signed)
Can we refill these? 

## 2012-11-15 ENCOUNTER — Other Ambulatory Visit: Payer: Self-pay

## 2012-11-16 ENCOUNTER — Other Ambulatory Visit (INDEPENDENT_AMBULATORY_CARE_PROVIDER_SITE_OTHER): Payer: BC Managed Care – PPO

## 2012-11-16 DIAGNOSIS — Z0000 Encounter for general adult medical examination without abnormal findings: Secondary | ICD-10-CM

## 2012-11-16 LAB — CBC WITH DIFFERENTIAL/PLATELET
Basophils Absolute: 0.1 10*3/uL (ref 0.0–0.1)
Eosinophils Absolute: 0.2 10*3/uL (ref 0.0–0.7)
Lymphocytes Relative: 29.1 % (ref 12.0–46.0)
MCHC: 33.8 g/dL (ref 30.0–36.0)
Neutrophils Relative %: 59.3 % (ref 43.0–77.0)
Platelets: 241 10*3/uL (ref 150.0–400.0)
RBC: 4.73 Mil/uL (ref 3.87–5.11)
RDW: 13.7 % (ref 11.5–14.6)

## 2012-11-16 LAB — POCT URINALYSIS DIPSTICK
Glucose, UA: NEGATIVE
Ketones, UA: NEGATIVE
Leukocytes, UA: NEGATIVE
Spec Grav, UA: 1.02

## 2012-11-16 LAB — LIPID PANEL
Cholesterol: 202 mg/dL — ABNORMAL HIGH (ref 0–200)
Total CHOL/HDL Ratio: 3
Triglycerides: 122 mg/dL (ref 0.0–149.0)
VLDL: 24.4 mg/dL (ref 0.0–40.0)

## 2012-11-16 LAB — BASIC METABOLIC PANEL
BUN: 16 mg/dL (ref 6–23)
CO2: 29 mEq/L (ref 19–32)
Calcium: 9.5 mg/dL (ref 8.4–10.5)
Creatinine, Ser: 0.8 mg/dL (ref 0.4–1.2)
Glucose, Bld: 101 mg/dL — ABNORMAL HIGH (ref 70–99)

## 2012-11-16 LAB — HEPATIC FUNCTION PANEL
Alkaline Phosphatase: 56 U/L (ref 39–117)
Bilirubin, Direct: 0.1 mg/dL (ref 0.0–0.3)
Total Bilirubin: 1.3 mg/dL — ABNORMAL HIGH (ref 0.3–1.2)

## 2012-11-17 NOTE — Progress Notes (Signed)
Quick Note:  Pt has appointment on 11/22/12 will go over then, also released results in my chart. ______

## 2012-11-22 ENCOUNTER — Ambulatory Visit (INDEPENDENT_AMBULATORY_CARE_PROVIDER_SITE_OTHER): Payer: BC Managed Care – PPO | Admitting: Family Medicine

## 2012-11-22 ENCOUNTER — Encounter: Payer: Self-pay | Admitting: Family Medicine

## 2012-11-22 VITALS — BP 130/84 | HR 60 | Temp 98.3°F | Ht 65.25 in | Wt 185.0 lb

## 2012-11-22 DIAGNOSIS — Z0000 Encounter for general adult medical examination without abnormal findings: Secondary | ICD-10-CM

## 2012-11-22 MED ORDER — METOPROLOL SUCCINATE ER 25 MG PO TB24: ORAL_TABLET | ORAL | Status: DC

## 2012-11-22 MED ORDER — POTASSIUM CHLORIDE CRYS ER 10 MEQ PO TBCR: EXTENDED_RELEASE_TABLET | ORAL | Status: DC

## 2012-11-22 MED ORDER — ESOMEPRAZOLE MAGNESIUM 40 MG PO CPDR: DELAYED_RELEASE_CAPSULE | ORAL | Status: DC

## 2012-11-22 NOTE — Progress Notes (Signed)
  Subjective:    Patient ID: Olivia Carr, female    DOB: [birth year], 52 y.o.   MRN: [id number]  HPI 52 yr old female for a cpx. She feels well.    Review of Systems  Constitutional: Negative.   HENT: Negative.   Eyes: Negative.   Respiratory: Negative.   Cardiovascular: Negative.   Gastrointestinal: Negative.   Genitourinary: Negative for dysuria, urgency, frequency, hematuria, flank pain, decreased urine volume, enuresis, difficulty urinating, pelvic pain and dyspareunia.  Musculoskeletal: Negative.   Skin: Negative.   Neurological: Negative.   Psychiatric/Behavioral: Negative.        Objective:   Physical Exam  Constitutional: She is oriented to person, place, and time. She appears well-developed and well-nourished. No distress.  HENT:  Head: Normocephalic and atraumatic.  Right Ear: External ear normal.  Left Ear: External ear normal.  Nose: Nose normal.  Mouth/Throat: Oropharynx is clear and moist. No oropharyngeal exudate.  Eyes: Conjunctivae and EOM are normal. Pupils are equal, round, and reactive to light. No scleral icterus.  Neck: Normal range of motion. Neck supple. No JVD present. No thyromegaly present.  Cardiovascular: Normal rate, regular rhythm, normal heart sounds and intact distal pulses.  Exam reveals no gallop and no friction rub.   No murmur heard. Pulmonary/Chest: Effort normal and breath sounds normal. No respiratory distress. She has no wheezes. She has no rales. She exhibits no tenderness.  Abdominal: Soft. Bowel sounds are normal. She exhibits no distension and no mass. There is no tenderness. There is no rebound and no guarding.  Musculoskeletal: Normal range of motion. She exhibits no edema and no tenderness.  Lymphadenopathy:    She has no cervical adenopathy.  Neurological: She is alert and oriented to person, place, and time. She has normal reflexes. No cranial nerve deficit. She exhibits normal muscle tone. Coordination normal.  Skin:  Skin is warm and dry. No rash noted. No erythema.  Psychiatric: She has a normal mood and affect. Her behavior is normal. Judgment and thought content normal.          Assessment & Plan:  Well exam. She is due to see Dr. Dorena Cookey soon for her colonoscopy.

## 2012-12-02 ENCOUNTER — Other Ambulatory Visit: Payer: Self-pay | Admitting: Gynecology

## 2012-12-02 DIAGNOSIS — Z1231 Encounter for screening mammogram for malignant neoplasm of breast: Secondary | ICD-10-CM

## 2012-12-28 HISTORY — PX: COLONOSCOPY: SHX174

## 2013-01-17 ENCOUNTER — Ambulatory Visit (HOSPITAL_COMMUNITY)
Admission: RE | Admit: 2013-01-17 | Discharge: 2013-01-17 | Disposition: A | Discharge status: Home / Self Care | Payer: BC Managed Care – PPO | Source: Ambulatory Visit | Attending: Gynecology | Admitting: Gynecology

## 2013-01-17 DIAGNOSIS — Z1231 Encounter for screening mammogram for malignant neoplasm of breast: Secondary | ICD-10-CM | POA: Insufficient documentation

## 2013-01-19 ENCOUNTER — Ambulatory Visit (INDEPENDENT_AMBULATORY_CARE_PROVIDER_SITE_OTHER): Payer: BC Managed Care – PPO | Admitting: Gynecology

## 2013-01-19 ENCOUNTER — Encounter: Payer: Self-pay | Admitting: Gynecology

## 2013-01-19 VITALS — BP 128/84 | Ht 65.0 in | Wt 185.6 lb

## 2013-01-19 DIAGNOSIS — Z780 Asymptomatic menopausal state: Secondary | ICD-10-CM

## 2013-01-19 DIAGNOSIS — F411 Generalized anxiety disorder: Secondary | ICD-10-CM

## 2013-01-19 DIAGNOSIS — Z01419 Encounter for gynecological examination (general) (routine) without abnormal findings: Secondary | ICD-10-CM

## 2013-01-19 DIAGNOSIS — F419 Anxiety disorder, unspecified: Secondary | ICD-10-CM

## 2013-01-19 DIAGNOSIS — N951 Menopausal and female climacteric states: Secondary | ICD-10-CM

## 2013-01-19 DIAGNOSIS — Z1159 Encounter for screening for other viral diseases: Secondary | ICD-10-CM

## 2013-01-19 MED ORDER — PAROXETINE MESYLATE 7.5 MG PO CAPS: 7.5000 mg | ORAL_CAPSULE | Freq: Every day | ORAL | Status: DC

## 2013-01-19 NOTE — Patient Instructions (Signed)
Paroxetine capsules What is this medicine? PAROXETINE (pa ROX e teen) is used to treat hot flashes due to menopause. This medicine may be used for other purposes; ask your health care provider or pharmacist if you have questions. COMMON BRAND NAME(S): Brisdelle What should I tell my health care provider before I take this medicine? They need to know if you have any of these conditions: -bleeding disorders -glaucoma -heart disease -kidney disease -liver disease -low levels of sodium in the blood -mania or bipolar disorder -seizures -suicidal thoughts, plans, or attempt; a previous suicide attempt by you or a family member -take MAOIs like Carbex, Eldepryl, Marplan, Nardil, and Parnate -take medicines that treat or prevent blood clots -an unusual or allergic reaction to paroxetine, other medicines, foods, dyes, or preservatives -pregnant or trying to get pregnant -breast-feeding How should I use this medicine? Take this medicine by mouth once daily at bedtime. Follow the directions on the prescription label. This medicine can be taken with or without food. Take your medicine at regular intervals. Do not take your medicine more often than directed. A special MedGuide will be given to you by the pharmacist with each prescription and refill. Be sure to read this information carefully each time. Overdosage: If you think you've taken too much of this medicine contact a poison control center or emergency room at once. Overdosage: If you think you have taken too much of this medicine contact a poison control center or emergency room at once. NOTE: This medicine is only for you. Do not share this medicine with others. What if I miss a dose? If you miss a dose, take it as soon as you can. If it is almost time for your next dose, take only that dose. Do not take double or extra doses. What may interact with this medicine? Do not take this medicine with any of the following  medications: -linezolid -MAOIs like Carbex, Eldepryl, Marplan, Nardil, and Parnate -methylene blue (injected into a vein) -pimozide -thioridazine This medicine may also interact with the following medications: -alcohol -aspirin and aspirin-like medicines -atomoxetine -certain medicines for depression, anxiety, or psychotic disturbances -certain medicines for irregular heart beat like propafenone, flecainide, encainide, and quinidine -certain medicines for migraine headache like almotriptan, eletriptan, frovatriptan, naratriptan, rizatriptan, sumatriptan, zolmitriptan -cimetidine -digoxin -diuretics -fentanyl -fosamprenavir -furazolidone -isoniazid -lithium -medicines that treat or prevent blood clots like warfarin, enoxaparin, and dalteparin -medicines for sleep -NSAIDs, medicines for pain and inflammation, like ibuprofen or naproxen -phenobarbital -phenytoin -procarbazine -rasagiline -ritonavir -supplements like St. John's wort, kava kava, valerian -tamoxifen -tramadol -tryptophan This list may not describe all possible interactions. Give your health care provider a list of all the medicines, herbs, non-prescription drugs, or dietary supplements you use. Also tell them if you smoke, drink alcohol, or use illegal drugs. Some items may interact with your medicine. What should I watch for while using this medicine? Tell your doctor or healthcare professional if your symptoms do not start to get better or if they get worse. Visit your doctor or health care professional for regular checks on your progress. Patients and their families should watch out for new or worsening thoughts of suicide or depression. Also watch out for sudden changes in feelings such as feeling anxious, agitated, panicky, irritable, hostile, aggressive, impulsive, severely restless, overly excited and hyperactive, or not being able to sleep. If this happens, especially at the beginning of treatment or after a  change in dose, call your health care professional. You may get drowsy or dizzy. Do   not drive, use machinery, or do anything that needs mental alertness until you know how this medicine affects you. Do not stand or sit up quickly, especially if you are an older patient. This reduces the risk of dizzy or fainting spells. Alcohol may interfere with the effect of this medicine. Avoid alcoholic drinks. Your mouth may get dry. Chewing sugarless gum, sucking hard candy and drinking plenty of water will help. Contact your doctor if the problem does not go away or is severe. Women should inform their doctor if they wish to become pregnant or think they might be pregnant. There is a potential for serious side effects to an unborn child. Talk to your health care professional or pharmacist for more information. Do not become pregnant while taking this medicine. What side effects may I notice from receiving this medicine? Side effects that you should report to your doctor or health care professional as soon as possible: -allergic reactions like skin rash, itching or hives, swelling of the face, lips, or tongue -changes in emotions or moods -confusion -depression -feeling faint or lightheaded, falls -seizures -suicidal thoughts or actions -unusual bleeding or bruising -unusually weak or tired -weakness Side effects that usually do not require medical attention (Report these to your doctor or health care professional if they continue or are bothersome.): -change in sex drive or performance -fatigue -drowsiness -headache -insomnia -nausea/vomiting -upset stomach This list may not describe all possible side effects. Call your doctor for medical advice about side effects. You may report side effects to FDA at 1-800-FDA-1088. Where should I keep my medicine? Keep out of the reach of children. Store at room temperature between 20 and 25 degrees C (68 and 77 degrees F). Throw away any unused medicine after  the expiration date. NOTE: This sheet is a summary. It may not cover all possible information. If you have questions about this medicine, talk to your doctor, pharmacist, or health care provider.  2014, Elsevier/Gold Standard. (2011-09-15 12:26:17)  

## 2013-01-19 NOTE — Progress Notes (Signed)
Olivia Carr October 13, 1960 [id number]   History:    52 y.o.  for annual gyn exam complaining of hot flashes but very sporadic.Patient with prior history of LAVH. Patient occasionally suffer from insomnia. Dr. Clent Ridges is her primary physician and her lab work was done this year. Her mammogram was December this year result pending. Patient with no prior history of abnormal Pap smear. Flu vaccine administered early this year. Tdap vaccine up to date. Last year patient's [facility] was elevated at 128.2. Patient had a colonoscopy this year by Dr. Madilyn Fireman to benign polyps were removed. Patient is on 5 year recall cycle. Patient is taking calcium and vitamin D regularly.   Past medical history,surgical history, family history and social history were all reviewed and documented in the EPIC chart.  Gynecologic History No LMP recorded. Patient has had a hysterectomy. Contraception: status post hysterectomy Last Pap: 2012. Results were: normal Last mammogram: 2014. Results were: result pending  Obstetric History OB History  Gravida Para Term Preterm AB SAB TAB Ectopic Multiple Living  3 2 2  1 1    2     # Outcome Date GA Lbr Len/2nd Weight Sex Delivery Anes PTL Lv  3 SAB           2 TRM     F SVD  N Y  1 TRM     F SVD  N Y       ROS: A ROS was performed and pertinent positives and negatives are included in the history.  GENERAL: No fevers or chills. HEENT: No change in vision, no earache, sore throat or sinus congestion. NECK: No pain or stiffness. CARDIOVASCULAR: No chest pain or pressure. No palpitations. PULMONARY: No shortness of breath, cough or wheeze. GASTROINTESTINAL: No abdominal pain, nausea, vomiting or diarrhea, melena or bright red blood per rectum. GENITOURINARY: No urinary frequency, urgency, hesitancy or dysuria. MUSCULOSKELETAL: No joint or muscle pain, no back pain, no recent trauma. DERMATOLOGIC: No rash, no itching, no lesions. ENDOCRINE: No polyuria, polydipsia, no heat or cold  intolerance. No recent change in weight. HEMATOLOGICAL: No anemia or easy bruising or bleeding. NEUROLOGIC: No headache, seizures, numbness, tingling or weakness. PSYCHIATRIC: No depression, no loss of interest in normal activity or change in sleep pattern.     Exam: chaperone present  BP 128/84  Ht 5\' 5"  (1.651 m)  Wt 185 lb 9.6 oz (84.188 kg)  BMI 30.89 kg/m2  Body mass index is 30.89 kg/(m^2).  General appearance : Well developed well nourished female. No acute distress HEENT: Neck supple, trachea midline, no carotid bruits, no thyroidmegaly Lungs: Clear to auscultation, no rhonchi or wheezes, or rib retractions  Heart: Regular rate and rhythm, no murmurs or gallops Breast:Examined in sitting and supine position were symmetrical in appearance, no palpable masses or tenderness,  no skin retraction, no nipple inversion, no nipple discharge, no skin discoloration, no axillary or supraclavicular lymphadenopathy Abdomen: no palpable masses or tenderness, no rebound or guarding Extremities: no edema or skin discoloration or tenderness  Pelvic:  Bartholin, Urethra, Skene Glands: Within normal limits             Vagina: No gross lesions or discharge  Cervix: absent  Uterus  absent  Adnexa  Without masses or tenderness  Anus and perineum  normal   Rectovaginal  normal sphincter tone without palpated masses or tenderness             Hemoccult colonoscopy this year 2 benign polyps removed  Assessment/Plan:  52 y.o. female for annual exam who is menopausal with vasomotor symptoms. Patient is going to be started on Brisdelle 7.5 mg daily. Samples for 6 weeks was provided. We'll monitor symptoms. Labs done by PCP. Pap smear not done today in accordance to the new guidelines. We discussed importance of calcium and vitamin D with regular exercise for osteoporosis prevention. She was scheduled for bone density study here in office the next few weeks.  New CDC guidelines is recommending  patients be tested once in her lifetime for hepatitis C antibody who were born between [birth year] through 1963. This was discussed with the patient today and has agreed to be tested today.  Note: This dictation was prepared with  Dragon/digital dictation along withSmart phrase technology. Any transcriptional errors that result from this process are unintentional.   Ok Edwards MD, 9:15 AM 01/19/2013

## 2013-03-22 ENCOUNTER — Encounter: Payer: Self-pay | Admitting: Gynecology

## 2013-03-22 ENCOUNTER — Ambulatory Visit (INDEPENDENT_AMBULATORY_CARE_PROVIDER_SITE_OTHER): Payer: BC Managed Care – PPO

## 2013-03-22 ENCOUNTER — Other Ambulatory Visit: Payer: Self-pay | Admitting: Gynecology

## 2013-03-22 DIAGNOSIS — Z9289 Personal history of other medical treatment: Secondary | ICD-10-CM

## 2013-03-22 DIAGNOSIS — Z13820 Encounter for screening for osteoporosis: Secondary | ICD-10-CM

## 2013-03-22 DIAGNOSIS — Z780 Asymptomatic menopausal state: Secondary | ICD-10-CM

## 2013-03-22 HISTORY — DX: Personal history of other medical treatment: Z92.89

## 2013-11-14 ENCOUNTER — Other Ambulatory Visit (INDEPENDENT_AMBULATORY_CARE_PROVIDER_SITE_OTHER): Payer: BC Managed Care – PPO

## 2013-11-14 DIAGNOSIS — Z0000 Encounter for general adult medical examination without abnormal findings: Secondary | ICD-10-CM

## 2013-11-14 LAB — POCT URINALYSIS DIPSTICK
BILIRUBIN UA: NEGATIVE
GLUCOSE UA: NEGATIVE
Ketones, UA: NEGATIVE
Leukocytes, UA: NEGATIVE
Nitrite, UA: NEGATIVE
PH UA: 5.5
Protein, UA: NEGATIVE
RBC UA: NEGATIVE
SPEC GRAV UA: 1.015
Urobilinogen, UA: 0.2

## 2013-11-14 LAB — CBC WITH DIFFERENTIAL/PLATELET
BASOS ABS: 0.1 10*3/uL (ref 0.0–0.1)
Basophils Relative: 0.6 % (ref 0.0–3.0)
EOS PCT: 1.4 % (ref 0.0–5.0)
Eosinophils Absolute: 0.1 10*3/uL (ref 0.0–0.7)
HEMATOCRIT: 45.2 % (ref 36.0–46.0)
Hemoglobin: 14.5 g/dL (ref 12.0–15.0)
LYMPHS ABS: 3.1 10*3/uL (ref 0.7–4.0)
LYMPHS PCT: 29.5 % (ref 12.0–46.0)
MCHC: 32.2 g/dL (ref 30.0–36.0)
MCV: 89.4 fl (ref 78.0–100.0)
MONOS PCT: 8.2 % (ref 3.0–12.0)
Monocytes Absolute: 0.9 10*3/uL (ref 0.1–1.0)
Neutro Abs: 6.3 10*3/uL (ref 1.4–7.7)
Neutrophils Relative %: 60.3 % (ref 43.0–77.0)
Platelets: 281 10*3/uL (ref 150.0–400.0)
RBC: 5.05 Mil/uL (ref 3.87–5.11)
RDW: 15 % (ref 11.5–15.5)
WBC: 10.4 10*3/uL (ref 4.0–10.5)

## 2013-11-14 LAB — BASIC METABOLIC PANEL
BUN: 17 mg/dL (ref 6–23)
CALCIUM: 9.1 mg/dL (ref 8.4–10.5)
CO2: 30 mEq/L (ref 19–32)
Chloride: 106 mEq/L (ref 96–112)
Creatinine, Ser: 0.8 mg/dL (ref 0.4–1.2)
GFR: 75.95 mL/min (ref >60.00)
GLUCOSE: 87 mg/dL (ref 70–99)
POTASSIUM: 4.8 meq/L (ref 3.5–5.1)
SODIUM: 143 meq/L (ref 135–145)

## 2013-11-14 LAB — LIPID PANEL
CHOL/HDL RATIO: 4
Cholesterol: 186 mg/dL (ref 0–200)
HDL: 47 mg/dL (ref >39.00)
LDL CALC: 114 mg/dL — AB (ref 0–99)
NonHDL: 139
Triglycerides: 125 mg/dL (ref 0.0–149.0)
VLDL: 25 mg/dL (ref 0.0–40.0)

## 2013-11-14 LAB — HEPATIC FUNCTION PANEL
ALK PHOS: 66 U/L (ref 39–117)
ALT: 14 U/L (ref 0–35)
AST: 17 U/L (ref 0–37)
Albumin: 3.3 g/dL — ABNORMAL LOW (ref 3.5–5.2)
Bilirubin, Direct: 0 mg/dL (ref 0.0–0.3)
Total Bilirubin: 0.8 mg/dL (ref 0.2–1.2)
Total Protein: 7.1 g/dL (ref 6.0–8.3)

## 2013-11-14 LAB — TSH: TSH: 0.64 u[IU]/mL (ref 0.35–4.50)

## 2013-11-15 ENCOUNTER — Other Ambulatory Visit: Payer: Self-pay

## 2013-11-18 ENCOUNTER — Other Ambulatory Visit: Payer: Self-pay | Admitting: Family Medicine

## 2013-11-23 ENCOUNTER — Encounter: Payer: Self-pay | Admitting: Family Medicine

## 2013-11-23 ENCOUNTER — Ambulatory Visit (INDEPENDENT_AMBULATORY_CARE_PROVIDER_SITE_OTHER): Payer: BC Managed Care – PPO | Admitting: Family Medicine

## 2013-11-23 VITALS — BP 119/79 | HR 73 | Temp 98.4°F | Ht 64.75 in | Wt 184.0 lb

## 2013-11-23 DIAGNOSIS — Z0000 Encounter for general adult medical examination without abnormal findings: Secondary | ICD-10-CM

## 2013-11-23 MED ORDER — METOPROLOL SUCCINATE ER 25 MG PO TB24: ORAL_TABLET | ORAL | Status: DC

## 2013-11-23 MED ORDER — ESOMEPRAZOLE MAGNESIUM 40 MG PO CPDR: DELAYED_RELEASE_CAPSULE | ORAL | Status: DC

## 2013-11-23 NOTE — Progress Notes (Signed)
   Subjective:    Patient ID: Olivia Carr, female    DOB: 09/20/1960, 53 y.o.   MRN: [id number]  HPI 53 yr old female for a cpx. She feels well in general. She had an allergy attack a few weeks ago and saw Dr. Donneta Romberg who gave her steroids and other treatments. She is now back to baseline.    Review of Systems  Constitutional: Negative.   HENT: Negative.   Eyes: Negative.   Respiratory: Negative.   Cardiovascular: Negative.   Gastrointestinal: Negative.   Genitourinary: Negative for dysuria, urgency, frequency, hematuria, flank pain, decreased urine volume, enuresis, difficulty urinating, pelvic pain and dyspareunia.  Musculoskeletal: Negative.   Skin: Negative.   Neurological: Negative.   Psychiatric/Behavioral: Negative.        Objective:   Physical Exam  Constitutional: She is oriented to person, place, and time. She appears well-developed and well-nourished. No distress.  HENT:  Head: Normocephalic and atraumatic.  Right Ear: External ear normal.  Left Ear: External ear normal.  Nose: Nose normal.  Mouth/Throat: Oropharynx is clear and moist. No oropharyngeal exudate.  Eyes: Conjunctivae and EOM are normal. Pupils are equal, round, and reactive to light. No scleral icterus.  Neck: Normal range of motion. Neck supple. No JVD present. No thyromegaly present.  Cardiovascular: Normal rate, regular rhythm, normal heart sounds and intact distal pulses.  Exam reveals no gallop and no friction rub.   No murmur heard. EKG normal   Pulmonary/Chest: Effort normal and breath sounds normal. No respiratory distress. She has no wheezes. She has no rales. She exhibits no tenderness.  Abdominal: Soft. Bowel sounds are normal. She exhibits no distension and no mass. There is no tenderness. There is no rebound and no guarding.  Musculoskeletal: Normal range of motion. She exhibits no edema and no tenderness.  Lymphadenopathy:    She has no cervical adenopathy.  Neurological: She is  alert and oriented to person, place, and time. She has normal reflexes. No cranial nerve deficit. She exhibits normal muscle tone. Coordination normal.  Skin: Skin is warm and dry. No rash noted. No erythema.  Psychiatric: She has a normal mood and affect. Her behavior is normal. Judgment and thought content normal.          Assessment & Plan:  Well exam.

## 2013-11-23 NOTE — Progress Notes (Signed)
Pre visit review using our clinic review tool, if applicable. No additional management support is needed unless otherwise documented below in the visit note. 

## 2013-11-28 ENCOUNTER — Encounter: Payer: Self-pay | Admitting: Family Medicine

## 2013-12-14 ENCOUNTER — Other Ambulatory Visit: Payer: Self-pay | Admitting: Gynecology

## 2013-12-14 DIAGNOSIS — Z1231 Encounter for screening mammogram for malignant neoplasm of breast: Secondary | ICD-10-CM

## 2013-12-19 ENCOUNTER — Other Ambulatory Visit: Payer: Self-pay | Admitting: Family Medicine

## 2014-01-19 ENCOUNTER — Ambulatory Visit (HOSPITAL_COMMUNITY)
Admission: RE | Admit: 2014-01-19 | Discharge: 2014-01-19 | Disposition: A | Discharge status: Home / Self Care | Payer: BC Managed Care – PPO | Source: Ambulatory Visit | Attending: Gynecology | Admitting: Gynecology

## 2014-01-19 DIAGNOSIS — Z1231 Encounter for screening mammogram for malignant neoplasm of breast: Secondary | ICD-10-CM | POA: Diagnosis present

## 2014-01-25 ENCOUNTER — Encounter: Payer: BC Managed Care – PPO | Admitting: Gynecology

## 2014-02-15 ENCOUNTER — Encounter: Payer: BC Managed Care – PPO | Admitting: Gynecology

## 2014-02-15 ENCOUNTER — Encounter: Payer: Self-pay | Admitting: Gynecology

## 2014-03-03 ENCOUNTER — Ambulatory Visit (INDEPENDENT_AMBULATORY_CARE_PROVIDER_SITE_OTHER): Payer: BLUE CROSS/BLUE SHIELD | Admitting: Gynecology

## 2014-03-03 ENCOUNTER — Encounter: Payer: Self-pay | Admitting: Gynecology

## 2014-03-03 VITALS — BP 124/78 | Ht 65.0 in | Wt 193.0 lb

## 2014-03-03 DIAGNOSIS — M533 Sacrococcygeal disorders, not elsewhere classified: Secondary | ICD-10-CM | POA: Insufficient documentation

## 2014-03-03 DIAGNOSIS — Z01419 Encounter for gynecological examination (general) (routine) without abnormal findings: Secondary | ICD-10-CM

## 2014-03-03 NOTE — Progress Notes (Signed)
Olivia Carr [birth year] [id number]   History:    54 y.o.  for annual gyn exam with the only complaining of occasional tenderness in her coccyx area which she sitting for long periods of time since she had a fall many years ago. Patient's PCP is Dr. Sharlene Motts who is been doing her blood work. Patient with past history of laparoscopic-assisted vaginal hysterectomy. Patient with no past history of any abnormal Pap smear. All her vaccines are up-to-date. In 2013 her Tye was found to be elevated with a value 128.2. She had been started on estrogen replacement therapy in the past but she is no longer taking it and states that her vasomotor symptoms are very few if any. She's having no vaginal dryness or dyspareunia. Also patient had a colonoscopy in 2014 and does have history of benign colon polyps and she is on a 5 year recall. Baseline bone density study in 2015 normal   Past medical history,surgical history, family history and social history were all reviewed and documented in the EPIC chart.  Gynecologic History No LMP recorded. Patient has had a hysterectomy. Contraception: status post hysterectomy Last Pap: 2012. Results were: normal Last mammogram: 2015. Results were: normal  Obstetric History OB History  Gravida Para Term Preterm AB SAB TAB Ectopic Multiple Living  3 2 2  1 1    2     # Outcome Date GA Lbr Len/2nd Weight Sex Delivery Anes PTL Lv  3 SAB           2 Term     F Vag-Spont  N Y  1 Term     F Vag-Spont  N Y       ROS: A ROS was performed and pertinent positives and negatives are included in the history.  GENERAL: No fevers or chills. HEENT: No change in vision, no earache, sore throat or sinus congestion. NECK: No pain or stiffness. CARDIOVASCULAR: No chest pain or pressure. No palpitations. PULMONARY: No shortness of breath, cough or wheeze. GASTROINTESTINAL: No abdominal pain, nausea, vomiting or diarrhea, melena or bright red blood per rectum. GENITOURINARY: No  urinary frequency, urgency, hesitancy or dysuria. MUSCULOSKELETAL: No joint or muscle pain, no back pain, no recent trauma. DERMATOLOGIC: No rash, no itching, no lesions. ENDOCRINE: No polyuria, polydipsia, no heat or cold intolerance. No recent change in weight. HEMATOLOGICAL: No anemia or easy bruising or bleeding. NEUROLOGIC: No headache, seizures, numbness, tingling or weakness. PSYCHIATRIC: No depression, no loss of interest in normal activity or change in sleep pattern.     Exam: chaperone present  BP 124/78 mmHg  Ht 5\' 5"  (1.651 m)  Wt 193 lb (87.544 kg)  BMI 32.12 kg/m2  Body mass index is 32.12 kg/(m^2).  General appearance : Well developed well nourished female. No acute distress HEENT: Neck supple, trachea midline, no carotid bruits, no thyroidmegaly Lungs: Clear to auscultation, no rhonchi or wheezes, or rib retractions  Heart: Regular rate and rhythm, no murmurs or gallops Breast:Examined in sitting and supine position were symmetrical in appearance, no palpable masses or tenderness,  no skin retraction, no nipple inversion, no nipple discharge, no skin discoloration, no axillary or supraclavicular lymphadenopathy Abdomen: no palpable masses or tenderness, no rebound or guarding Extremities: no edema or skin discoloration or tenderness  Pelvic:  Bartholin, Urethra, Skene Glands: Within normal limits             Vagina: No gross lesions or discharge  Cervix: Absent  Uterus absent  Adnexa  Without  masses or tenderness  Anus and perineum  normal   Rectovaginal  normal sphincter tone without palpated masses or tenderness             Hemoccult cards will be provided     Assessment/Plan:  54 y.o. female for annual exam menopausal with very few vasomotor symptoms no longer on ERT. Pap smear no longer needed. Fecal Hemoccult cards were provided for her to submit to the office for testing. PCP doing her blood work. She has gained some weight we discussed importance of regular  exercise as well as calcium and vitamin D for osteoporosis prevention.   Terrance Mass MD, 10:10 AM 03/03/2014

## 2014-03-13 ENCOUNTER — Other Ambulatory Visit: Payer: Self-pay | Admitting: Family Medicine

## 2014-03-18 ENCOUNTER — Other Ambulatory Visit: Payer: Self-pay | Admitting: Family Medicine

## 2014-10-30 ENCOUNTER — Other Ambulatory Visit: Payer: Self-pay | Admitting: Family Medicine

## 2014-10-30 NOTE — Telephone Encounter (Signed)
Pt request refill of the following: metoprolol succinate (TOPROL-XL) 25 MG 24 hr tablet   Phamacy: [facility]

## 2014-10-31 MED ORDER — METOPROLOL SUCCINATE ER 25 MG PO TB24: ORAL_TABLET | ORAL | Status: DC

## 2014-10-31 NOTE — Telephone Encounter (Signed)
Rx sent to [facility].  Pt is scheduled for labs and annual exam later this month.

## 2014-11-21 ENCOUNTER — Other Ambulatory Visit (INDEPENDENT_AMBULATORY_CARE_PROVIDER_SITE_OTHER): Payer: BLUE CROSS/BLUE SHIELD

## 2014-11-21 DIAGNOSIS — Z0000 Encounter for general adult medical examination without abnormal findings: Secondary | ICD-10-CM | POA: Diagnosis not present

## 2014-11-21 LAB — POCT URINALYSIS DIPSTICK
Bilirubin, UA: NEGATIVE
Glucose, UA: NEGATIVE
KETONES UA: NEGATIVE
LEUKOCYTES UA: NEGATIVE
NITRITE UA: NEGATIVE
PH UA: 5.5
PROTEIN UA: NEGATIVE
Spec Grav, UA: >=1.03
UROBILINOGEN UA: 0.2

## 2014-11-21 LAB — CBC WITH DIFFERENTIAL/PLATELET
BASOS ABS: 0 10*3/uL (ref 0.0–0.1)
BASOS PCT: 0.8 % (ref 0.0–3.0)
EOS ABS: 0.3 10*3/uL (ref 0.0–0.7)
Eosinophils Relative: 4.8 % (ref 0.0–5.0)
HCT: 39.5 % (ref 36.0–46.0)
HEMOGLOBIN: 12.8 g/dL (ref 12.0–15.0)
LYMPHS PCT: 34.4 % (ref 12.0–46.0)
Lymphs Abs: 1.8 10*3/uL (ref 0.7–4.0)
MCHC: 32.5 g/dL (ref 30.0–36.0)
MCV: 84.7 fl (ref 78.0–100.0)
MONO ABS: 0.4 10*3/uL (ref 0.1–1.0)
Monocytes Relative: 8.3 % (ref 3.0–12.0)
Neutro Abs: 2.8 10*3/uL (ref 1.4–7.7)
Neutrophils Relative %: 51.7 % (ref 43.0–77.0)
Platelets: 280 10*3/uL (ref 150.0–400.0)
RBC: 4.67 Mil/uL (ref 3.87–5.11)
RDW: 14.6 % (ref 11.5–15.5)
WBC: 5.4 10*3/uL (ref 4.0–10.5)

## 2014-11-21 LAB — TSH: TSH: 1.36 u[IU]/mL (ref 0.35–4.50)

## 2014-11-21 LAB — BASIC METABOLIC PANEL
BUN: 18 mg/dL (ref 6–23)
CALCIUM: 9.2 mg/dL (ref 8.4–10.5)
CO2: 27 mEq/L (ref 19–32)
CREATININE: 0.74 mg/dL (ref 0.40–1.20)
Chloride: 108 mEq/L (ref 96–112)
GFR: 87.56 mL/min (ref >60.00)
Glucose, Bld: 102 mg/dL — ABNORMAL HIGH (ref 70–99)
Potassium: 4.8 mEq/L (ref 3.5–5.1)
Sodium: 142 mEq/L (ref 135–145)

## 2014-11-21 LAB — LIPID PANEL
CHOL/HDL RATIO: 4
Cholesterol: 179 mg/dL (ref 0–200)
HDL: 50.2 mg/dL (ref >39.00)
LDL CALC: 107 mg/dL — AB (ref 0–99)
NONHDL: 128.44
TRIGLYCERIDES: 109 mg/dL (ref 0.0–149.0)
VLDL: 21.8 mg/dL (ref 0.0–40.0)

## 2014-11-21 LAB — HEPATIC FUNCTION PANEL
ALK PHOS: 73 U/L (ref 39–117)
ALT: 12 U/L (ref 0–35)
AST: 15 U/L (ref 0–37)
Albumin: 3.7 g/dL (ref 3.5–5.2)
BILIRUBIN DIRECT: 0.1 mg/dL (ref 0.0–0.3)
BILIRUBIN TOTAL: 0.8 mg/dL (ref 0.2–1.2)
Total Protein: 6.6 g/dL (ref 6.0–8.3)

## 2014-11-27 ENCOUNTER — Encounter: Payer: Self-pay | Admitting: Family Medicine

## 2014-11-27 ENCOUNTER — Ambulatory Visit (INDEPENDENT_AMBULATORY_CARE_PROVIDER_SITE_OTHER): Payer: BLUE CROSS/BLUE SHIELD | Admitting: Family Medicine

## 2014-11-27 VITALS — BP 117/81 | HR 66 | Temp 98.3°F | Ht 64.5 in | Wt 194.0 lb

## 2014-11-27 DIAGNOSIS — Z23 Encounter for immunization: Secondary | ICD-10-CM

## 2014-11-27 DIAGNOSIS — Z0000 Encounter for general adult medical examination without abnormal findings: Secondary | ICD-10-CM | POA: Diagnosis not present

## 2014-11-27 MED ORDER — FLUTICASONE PROPIONATE 50 MCG/ACT NA SUSP: NASAL | Status: DC

## 2014-11-27 MED ORDER — POTASSIUM CHLORIDE CRYS ER 10 MEQ PO TBCR: EXTENDED_RELEASE_TABLET | ORAL | Status: DC

## 2014-11-27 MED ORDER — ESOMEPRAZOLE MAGNESIUM 40 MG PO CPDR: DELAYED_RELEASE_CAPSULE | ORAL | Status: DC

## 2014-11-27 MED ORDER — METOPROLOL SUCCINATE ER 25 MG PO TB24: ORAL_TABLET | ORAL | Status: DC

## 2014-11-27 MED ORDER — MONTELUKAST SODIUM 10 MG PO TABS: 10.0000 mg | ORAL_TABLET | Freq: Every day | ORAL | Status: DC

## 2014-11-27 NOTE — Progress Notes (Signed)
Pre visit review using our clinic review tool, if applicable. No additional management support is needed unless otherwise documented below in the visit note. 

## 2014-11-27 NOTE — Progress Notes (Signed)
   Subjective:    Patient ID: Olivia Carr, female    DOB: [birth year], 54 y.o.   MRN: [id number]  HPI 54 yr old female for a cpx. She feels well and has no concerns.    Review of Systems  Constitutional: Negative.   HENT: Negative.   Eyes: Negative.   Respiratory: Negative.   Cardiovascular: Negative.   Gastrointestinal: Negative.   Genitourinary: Negative for dysuria, urgency, frequency, hematuria, flank pain, decreased urine volume, enuresis, difficulty urinating, pelvic pain and dyspareunia.  Musculoskeletal: Negative.   Skin: Negative.   Neurological: Negative.   Psychiatric/Behavioral: Negative.        Objective:   Physical Exam  Constitutional: She is oriented to person, place, and time. She appears well-developed and well-nourished. No distress.  HENT:  Head: Normocephalic and atraumatic.  Right Ear: External ear normal.  Left Ear: External ear normal.  Nose: Nose normal.  Mouth/Throat: Oropharynx is clear and moist. No oropharyngeal exudate.  Eyes: Conjunctivae and EOM are normal. Pupils are equal, round, and reactive to light. No scleral icterus.  Neck: Normal range of motion. Neck supple. No JVD present. No thyromegaly present.  Cardiovascular: Normal rate, regular rhythm, normal heart sounds and intact distal pulses.  Exam reveals no gallop and no friction rub.   No murmur heard. EKG normal   Pulmonary/Chest: Effort normal and breath sounds normal. No respiratory distress. She has no wheezes. She has no rales. She exhibits no tenderness.  Abdominal: Soft. Bowel sounds are normal. She exhibits no distension and no mass. There is no tenderness. There is no rebound and no guarding.  Musculoskeletal: Normal range of motion. She exhibits no edema or tenderness.  Lymphadenopathy:    She has no cervical adenopathy.  Neurological: She is alert and oriented to person, place, and time. She has normal reflexes. No cranial nerve deficit. She exhibits normal muscle tone.  Coordination normal.  Skin: Skin is warm and dry. No rash noted. No erythema.  Psychiatric: She has a normal mood and affect. Her behavior is normal. Judgment and thought content normal.          Assessment & Plan:  Well exam. We discussed diet and exercise advice.

## 2014-12-06 ENCOUNTER — Ambulatory Visit (INDEPENDENT_AMBULATORY_CARE_PROVIDER_SITE_OTHER): Payer: BLUE CROSS/BLUE SHIELD | Admitting: Adult Health

## 2014-12-06 VITALS — BP 150/90 | HR 85 | Temp 98.1°F | Wt 195.5 lb

## 2014-12-06 DIAGNOSIS — N3001 Acute cystitis with hematuria: Secondary | ICD-10-CM

## 2014-12-06 DIAGNOSIS — R300 Dysuria: Secondary | ICD-10-CM

## 2014-12-06 LAB — POCT URINALYSIS DIPSTICK
BILIRUBIN UA: NEGATIVE
GLUCOSE UA: NEGATIVE
KETONES UA: NEGATIVE
Nitrite, UA: NEGATIVE
PH UA: 6
SPEC GRAV UA: 1.02
Urobilinogen, UA: 0.2

## 2014-12-06 MED ORDER — CIPROFLOXACIN HCL 500 MG PO TABS: 500.0000 mg | ORAL_TABLET | Freq: Two times a day (BID) | ORAL | Status: DC

## 2014-12-06 MED ORDER — PHENAZOPYRIDINE HCL 200 MG PO TABS: 200.0000 mg | ORAL_TABLET | Freq: Three times a day (TID) | ORAL | Status: DC | PRN

## 2014-12-06 NOTE — Progress Notes (Signed)
PCP:FRY,STEPHEN A, MD Chief Complaint  Patient presents with  . Urinary Tract Infection    Current Issues:  Presents with 3 days of dysuria and urinary frequency Associated symptoms include:  dysuria, fever subjective low grade, urinary frequency and urinary urgency  There is a previous history of of similar symptoms. Sexually active:  Yes with female.   No concern for STI.  Prior to Admission medications   Medication Sig Start Date End Date Taking? Authorizing Provider  calcium carbonate (TUMS EX) 750 MG chewable tablet Chew 1 tablet by mouth daily.   Yes Historical Provider, MD  cetirizine (ZYRTEC) 10 MG tablet Take 10 mg by mouth daily.     Yes Historical Provider, MD  esomeprazole (NEXIUM) 40 MG capsule TAKE 1 CAPSULE DAILY 11/27/14  Yes Laurey Morale, MD  fluticasone [facility]) 50 MCG/ACT nasal spray USE 2 SPRAYS IN [facility]       NOSTRIL DAILY 11/27/14  Yes Laurey Morale, MD  ibuprofen (ADVIL,MOTRIN) 200 MG tablet Take 200 mg by mouth every 6 (six) hours as needed. For pain   Yes Historical Provider, MD  levalbuterol [facility] HFA) 45 MCG/ACT inhaler Inhale 1-2 puffs into the lungs every 4 (four) hours as needed for wheezing or shortness of breath. 11/11/10  Yes Laurey Morale, MD  metoprolol succinate (TOPROL-XL) 25 MG 24 hr tablet TAKE 1 TABLET DAILY 11/27/14  Yes Laurey Morale, MD  montelukast (SINGULAIR) 10 MG tablet Take 1 tablet (10 mg total) by mouth at bedtime. 11/27/14  Yes Laurey Morale, MD  potassium chloride (KLOR-CON M10) 10 MEQ tablet TAKE 1 TABLET DAILY 11/27/14  Yes Laurey Morale, MD    Review of Systems  PE:  BP 150/90 mmHg  Pulse 85  Temp(Src) 98.1 F (36.7 C) (Oral)  Wt 195 lb 8 oz (88.678 kg) Constitutional No acture distress Heart Normal rate and normal rhythm. No murmurs, gallops, or rubs Lungs Clear to auscultation Back No CVA tenderness Abdomen No masses,distension, or tenderness   Results for orders placed or performed in visit on 05/39/76  Basic  metabolic panel  Result Value Ref Range   Sodium 142 135 - 145 mEq/L   Potassium 4.8 3.5 - 5.1 mEq/L   Chloride 108 96 - 112 mEq/L   CO2 27 19 - 32 mEq/L   Glucose, Bld 102 (H) 70 - 99 mg/dL   BUN 18 6 - 23 mg/dL   Creatinine, Ser 0.74 0.40 - 1.20 mg/dL   Calcium 9.2 8.4 - 10.5 mg/dL   GFR 87.56 >60.00 mL/min  CBC with Differential/Platelet  Result Value Ref Range   WBC 5.4 4.0 - 10.5 K/uL   RBC 4.67 3.87 - 5.11 Mil/uL   Hemoglobin 12.8 12.0 - 15.0 g/dL   HCT 39.5 36.0 - 46.0 %   MCV 84.7 78.0 - 100.0 fl   MCHC 32.5 30.0 - 36.0 g/dL   RDW 14.6 11.5 - 15.5 %   Platelets 280.0 150.0 - 400.0 K/uL   Neutrophils Relative % 51.7 43.0 - 77.0 %   Lymphocytes Relative 34.4 12.0 - 46.0 %   Monocytes Relative 8.3 3.0 - 12.0 %   Eosinophils Relative 4.8 0.0 - 5.0 %   Basophils Relative 0.8 0.0 - 3.0 %   Neutro Abs 2.8 1.4 - 7.7 K/uL   Lymphs Abs 1.8 0.7 - 4.0 K/uL   Monocytes Absolute 0.4 0.1 - 1.0 K/uL   Eosinophils Absolute 0.3 0.0 - 0.7 K/uL   Basophils Absolute 0.0 0.0 -  0.1 K/uL  Hepatic function panel  Result Value Ref Range   Total Bilirubin 0.8 0.2 - 1.2 mg/dL   Bilirubin, Direct 0.1 0.0 - 0.3 mg/dL   Alkaline Phosphatase 73 39 - 117 U/L   AST 15 0 - 37 U/L   ALT 12 0 - 35 U/L   Total Protein 6.6 6.0 - 8.3 g/dL   Albumin 3.7 3.5 - 5.2 g/dL  Lipid panel  Result Value Ref Range   Cholesterol 179 0 - 200 mg/dL   Triglycerides 109.0 0.0 - 149.0 mg/dL   HDL 50.20 >39.00 mg/dL   VLDL 21.8 0.0 - 40.0 mg/dL   LDL Cholesterol 107 (H) 0 - 99 mg/dL   Total CHOL/HDL Ratio 4    NonHDL 128.44   TSH  Result Value Ref Range   TSH 1.36 0.35 - 4.50 uIU/mL  POCT urinalysis dipstick  Result Value Ref Range   Color, UA yellow    Clarity, UA clear    Glucose, UA n    Bilirubin, UA n    Ketones, UA n    Spec Grav, UA >=1.030    Blood, UA trace lysed    pH, UA 5.5    Protein, UA n    Urobilinogen, UA 0.2    Nitrite, UA n    Leukocytes, UA Negative Negative    Assessment and  Plan:    1. Dysuria - POCT urinalysis dipstick - + leukocytes and blood  2. Acute cystitis with hematuria [N30.01] - Urine culture - ciprofloxacin (CIPRO) 500 MG tablet; Take 1 tablet (500 mg total) by mouth 2 (two) times daily.  Dispense: 6 tablet; Refill: 0 - phenazopyridine (PYRIDIUM) 200 MG tablet; Take 1 tablet (200 mg total) by mouth 3 (three) times daily as needed for pain.  Dispense: 10 tablet; Refill: 0

## 2014-12-06 NOTE — Progress Notes (Signed)
Pre visit review using our clinic review tool, if applicable. No additional management support is needed unless otherwise documented below in the visit note. 

## 2014-12-06 NOTE — Patient Instructions (Signed)

## 2014-12-08 ENCOUNTER — Telehealth: Payer: Self-pay | Admitting: Family Medicine

## 2014-12-08 DIAGNOSIS — N3001 Acute cystitis with hematuria: Secondary | ICD-10-CM

## 2014-12-08 MED ORDER — CIPROFLOXACIN HCL 500 MG PO TABS: 500.0000 mg | ORAL_TABLET | Freq: Two times a day (BID) | ORAL | Status: DC

## 2014-12-08 NOTE — Telephone Encounter (Signed)
Called and spoke with pt and she still lhas burning, itching and pressure in lower abdomen.  Pt's chills have stopped.  Pt is still having discomfort with urination.  Pt states the symptoms are improving.

## 2014-12-08 NOTE — Telephone Encounter (Signed)
Add Cipro 500mg  BID for 3 additional days.

## 2014-12-08 NOTE — Telephone Encounter (Signed)
Rx sent to pharmacy.Called and spoke with pt and pt is awaret rx was sent.

## 2014-12-08 NOTE — Telephone Encounter (Signed)
Pt saw [facility] w/ uti.  Rx Cipro for 3 days.  Pt states she has only one tab left for today   and is still having symptoms. Pt wants to know if this will stay in her system or do you think she may need additional med?   [facility]

## 2014-12-09 LAB — URINE CULTURE: Colony Count: >=100000

## 2015-01-04 ENCOUNTER — Other Ambulatory Visit: Payer: Self-pay

## 2015-01-04 DIAGNOSIS — Z1231 Encounter for screening mammogram for malignant neoplasm of breast: Secondary | ICD-10-CM

## 2015-01-23 ENCOUNTER — Ambulatory Visit
Admission: RE | Admit: 2015-01-23 | Discharge: 2015-01-23 | Disposition: A | Discharge status: Home / Self Care | Payer: BLUE CROSS/BLUE SHIELD | Source: Ambulatory Visit

## 2015-01-23 DIAGNOSIS — Z1231 Encounter for screening mammogram for malignant neoplasm of breast: Secondary | ICD-10-CM

## 2015-02-16 ENCOUNTER — Other Ambulatory Visit: Payer: Self-pay | Admitting: Family Medicine

## 2015-03-07 ENCOUNTER — Ambulatory Visit (INDEPENDENT_AMBULATORY_CARE_PROVIDER_SITE_OTHER): Payer: BLUE CROSS/BLUE SHIELD | Admitting: Gynecology

## 2015-03-07 ENCOUNTER — Encounter: Payer: Self-pay | Admitting: Gynecology

## 2015-03-07 VITALS — BP 150/92 | Ht 64.75 in | Wt 195.0 lb

## 2015-03-07 DIAGNOSIS — Z01419 Encounter for gynecological examination (general) (routine) without abnormal findings: Secondary | ICD-10-CM

## 2015-03-07 DIAGNOSIS — Z780 Asymptomatic menopausal state: Secondary | ICD-10-CM | POA: Diagnosis not present

## 2015-03-07 NOTE — Patient Instructions (Signed)

## 2015-03-07 NOTE — Progress Notes (Signed)
Olivia Carr [birth year] [id number]   History:    55 y.o.  for annual gyn exam  With no complaints today. Patient is menopausal and on no hormone replacement therapy.Patient's PCP is Dr. Sharlene Motts who is been doing her blood work. Patient with past history of laparoscopic-assisted vaginal hysterectomy. Patient with no past history of any abnormal Pap smear. All her vaccines are up-to-date. In 2013 her [place] was found to be elevated with a value 128.2.  Patient  Had a colonoscopy were by benign polyps were removed in 2014 she is on a 5 year recall. Baseline bone density study in 2015 was normal. Patient is taking her calcium with vitamin D.  Past medical history,surgical history, family history and social history were all reviewed and documented in the EPIC chart.  Gynecologic History No LMP recorded. Patient has had a hysterectomy. Contraception: post menopausal status Last Pap:  2012. Results were: normal Last mammogram:  2016. Results were: normal  Obstetric History OB History  Gravida Para Term Preterm AB SAB TAB Ectopic Multiple Living  3 2 2  1 1    2     # Outcome Date GA Lbr Len/2nd Weight Sex Delivery Anes PTL Lv  3 SAB           2 Term     F Vag-Spont  N Y  1 Term     F Vag-Spont  N Y       ROS: A ROS was performed and pertinent positives and negatives are included in the history.  GENERAL: No fevers or chills. HEENT: No change in vision, no earache, sore throat or sinus congestion. NECK: No pain or stiffness. CARDIOVASCULAR: No chest pain or pressure. No palpitations. PULMONARY: No shortness of breath, cough or wheeze. GASTROINTESTINAL: No abdominal pain, nausea, vomiting or diarrhea, melena or bright red blood per rectum. GENITOURINARY: No urinary frequency, urgency, hesitancy or dysuria. MUSCULOSKELETAL: No joint or muscle pain, no back pain, no recent trauma. DERMATOLOGIC: No rash, no itching, no lesions. ENDOCRINE: No polyuria, polydipsia, no heat or cold intolerance. No  recent change in weight. HEMATOLOGICAL: No anemia or easy bruising or bleeding. NEUROLOGIC: No headache, seizures, numbness, tingling or weakness. PSYCHIATRIC: No depression, no loss of interest in normal activity or change in sleep pattern.     Exam: chaperone present  BP 150/92 mmHg  Ht 5' 4.75" (1.645 m)  Wt 195 lb (88.451 kg)  BMI 32.69 kg/m2  Body mass index is 32.69 kg/(m^2).  General appearance : Well developed well nourished female. No acute distress HEENT: Eyes: no retinal hemorrhage or exudates,  Neck supple, trachea midline, no carotid bruits, no thyroidmegaly Lungs: Clear to auscultation, no rhonchi or wheezes, or rib retractions  Heart: Regular rate and rhythm, no murmurs or gallops Breast:Examined in sitting and supine position were symmetrical in appearance, no palpable masses or tenderness,  no skin retraction, no nipple inversion, no nipple discharge, no skin discoloration, no axillary or supraclavicular lymphadenopathy Abdomen: no palpable masses or tenderness, no rebound or guarding Extremities: no edema or skin discoloration or tenderness  Pelvic:  Bartholin, Urethra, Skene Glands: Within normal limits             Vagina: No gross lesions or discharge  Cervix: absent  Uterus   absent  Adnexa  Without masses or tenderness  Anus and perineum  normal   Rectovaginal  normal sphincter tone without palpated masses or tenderness  Hemoccult  Cards will be provided    patient with history of hypertension not sure she to her blood pressure medication this morning. Her blood pressure readings on arrival was follows: 150/94, 152/92. Patient was instructed to lay down for 10-15 minutes to lyse returned off in a repeat blood pressure was: 140/80   Assessment/Plan:  55 y.o. female for annual exam  Menopausal on no hormone replacement therapy asymptomatic otherwise. Pap smear no longer indicated since she had a hysterectomy and past history of normal Pap smears. Her  PCP is been doing her blood work. Patient to schedule her bone density study in March of this year. She was provided with fecal Hemoccult cards submitted to the office for testing. It was stressed upon her the importance of adhering to her daily medication her blood pressure  Which is metoprolol  25 mg daily.   Terrance Mass MD, 11:37 AM 03/07/2015

## 2015-04-10 ENCOUNTER — Encounter: Payer: Self-pay | Admitting: Women's Health

## 2015-04-10 ENCOUNTER — Other Ambulatory Visit: Payer: Self-pay | Admitting: Gynecology

## 2015-04-10 ENCOUNTER — Ambulatory Visit (INDEPENDENT_AMBULATORY_CARE_PROVIDER_SITE_OTHER): Payer: BLUE CROSS/BLUE SHIELD | Admitting: Women's Health

## 2015-04-10 ENCOUNTER — Ambulatory Visit (INDEPENDENT_AMBULATORY_CARE_PROVIDER_SITE_OTHER): Payer: BLUE CROSS/BLUE SHIELD

## 2015-04-10 DIAGNOSIS — Z13820 Encounter for screening for osteoporosis: Secondary | ICD-10-CM

## 2015-04-10 DIAGNOSIS — N390 Urinary tract infection, site not specified: Secondary | ICD-10-CM | POA: Diagnosis not present

## 2015-04-10 DIAGNOSIS — Z780 Asymptomatic menopausal state: Secondary | ICD-10-CM

## 2015-04-10 DIAGNOSIS — R319 Hematuria, unspecified: Secondary | ICD-10-CM

## 2015-04-10 DIAGNOSIS — N3001 Acute cystitis with hematuria: Secondary | ICD-10-CM | POA: Diagnosis not present

## 2015-04-10 LAB — URINALYSIS W MICROSCOPIC + REFLEX CULTURE
BILIRUBIN URINE: NEGATIVE
CASTS: NONE SEEN [LPF]
CRYSTALS: NONE SEEN [HPF]
GLUCOSE, UA: NEGATIVE
KETONES UR: NEGATIVE
NITRITE: NEGATIVE
PH: 5.5 (ref 5.0–8.0)
Specific Gravity, Urine: 1.02 (ref 1.001–1.035)
Yeast: NONE SEEN [HPF]

## 2015-04-10 MED ORDER — CIPROFLOXACIN HCL 250 MG PO TABS: 250.0000 mg | ORAL_TABLET | Freq: Two times a day (BID) | ORAL | Status: DC

## 2015-04-10 MED ORDER — PHENAZOPYRIDINE HCL 200 MG PO TABS: 200.0000 mg | ORAL_TABLET | Freq: Three times a day (TID) | ORAL | Status: DC | PRN

## 2015-04-10 NOTE — Patient Instructions (Signed)

## 2015-04-10 NOTE — Progress Notes (Signed)
Patient presents for a bone density scan and problem. Yesterday she began having dysuria, suprapubic pressure and pain, and low back pain. Fever of 100F this morning. Denies visible blood or discharge. Patient had one UTI last year that needed 2 rounds of antibiotics. Questions if it is intercourse related.  Exam: Appears well. Urinalysis: +3 blood, +2 leukocytes,, packed WBCs, packed RBCs, many bacteria,   Urinary tract infection with hematuria  Plan: Rx for Cipro 250 mg BID 5 days. Will Return in 2 weeks for follow-up urinalysis. UTI prevention discussed.

## 2015-04-12 ENCOUNTER — Encounter: Payer: Self-pay | Admitting: Women's Health

## 2015-04-12 LAB — URINE CULTURE: Colony Count: >=100000

## 2015-04-24 ENCOUNTER — Other Ambulatory Visit: Payer: BLUE CROSS/BLUE SHIELD

## 2015-04-24 DIAGNOSIS — R319 Hematuria, unspecified: Principal | ICD-10-CM

## 2015-04-24 DIAGNOSIS — N390 Urinary tract infection, site not specified: Secondary | ICD-10-CM

## 2015-04-25 LAB — URINALYSIS W MICROSCOPIC + REFLEX CULTURE
BACTERIA UA: NONE SEEN [HPF]
BILIRUBIN URINE: NEGATIVE
Casts: NONE SEEN [LPF]
Crystals: NONE SEEN [HPF]
GLUCOSE, UA: NEGATIVE
Hgb urine dipstick: NEGATIVE
KETONES UR: NEGATIVE
LEUKOCYTES UA: NEGATIVE
Nitrite: NEGATIVE
PROTEIN: NEGATIVE
Specific Gravity, Urine: 1.022 (ref 1.001–1.035)
WBC UA: NONE SEEN WBC/HPF (ref <=5)
Yeast: NONE SEEN [HPF]
pH: 5.5 (ref 5.0–8.0)

## 2015-04-26 LAB — URINE CULTURE
Colony Count: NO GROWTH
ORGANISM ID, BACTERIA: NO GROWTH

## 2015-08-20 ENCOUNTER — Other Ambulatory Visit: Payer: Self-pay | Admitting: Family Medicine

## 2015-08-20 ENCOUNTER — Telehealth: Payer: Self-pay | Admitting: Family Medicine

## 2015-08-20 ENCOUNTER — Telehealth: Payer: Self-pay

## 2015-08-20 DIAGNOSIS — N390 Urinary tract infection, site not specified: Secondary | ICD-10-CM

## 2015-08-20 NOTE — Telephone Encounter (Signed)
Yes she can leave a UA

## 2015-08-20 NOTE — Telephone Encounter (Signed)
Patient Name: KYMORAH PURSLEY DOB: [birth year] Initial Comment Caller states she finished antibiotics for UTI, not having symptoms, wants to know if followup urinalysis needed Nurse Assessment Nurse: Ronnald Ramp, RN, Miranda Date/Time (Eastern Time): 08/20/2015 9:59:05 AM Confirm and document reason for call. If symptomatic, describe symptoms. You must click the next button to save text entered. ---Caller states she was out of town and seen in [place], diagnosed with UTI and told to follow up with PCP for a recheck UA. She is not currently having symptoms. She does not feel she needs an appt. She just wants to know if she can just drop off a sample. In the past year she has had 3-4 UTI's. She is also wanting to know if she needs to see a Dealer. Has the patient traveled out of the country within the last 30 days? ---Not Applicable Does the patient have any new or worsening symptoms? ---No Please document clinical information provided and list any resource used. ---Told caller that a message would be send to PCP to review and have his nurse call her back. Guidelines Guideline Title Affirmed Question Affirmed Notes Final Disposition User Clinical Call Ronnald Ramp, RN, [employer]

## 2015-08-20 NOTE — Telephone Encounter (Signed)
Patient called in questioning if she could just drop off a urine sample to ensure her UTI was gone. Help desk told her the following:  Told caller that a message would be send to PCP to review and have his nurse call her back.   Patient Name: Olivia Carr Gender: Female DOB: 1960/06/23 Age: 55 Y 10 M 7 D Return Phone Number: [phone] (Primary) Address: City/State/Zip: [place] [address] Client [facility] Mobile Day - Client Client Site [facility] - Day Physician Alysia Penna - MD Contact Type Call Who Is Calling Patient / Member / Family / Caregiver Call Type Triage / Clinical Relationship To Patient Self Return Phone Number [phone] (Primary) Chief Complaint Health information question (non symptomatic) Reason for Call Symptomatic / Request for [place] states she finished antibiotics for UTI, not having symptoms, wants to know if followup urinalysis needed Appointment Disposition EMR Appointment Not Necessary Info pasted into Epic Yes Translation No Nurse Assessment Nurse: Ronnald Ramp, RN, Miranda Date/Time (Eastern Time): 08/20/2015 9:59:05 AM Confirm and document reason for call. If symptomatic, describe symptoms. You must click the next button to save text entered. ---Caller states she was out of town and seen in [place], diagnosed with UTI and told to follow up with PCP for a recheck UA. She is not currently having symptoms. She does not feel she needs an appt. She just wants to know if she can just drop off a sample. In the past year she has had 3-4 UTI's. She is also wanting to know if she needs to see a Dealer. Has the patient traveled out of the country within the last 30 days? ---Not Applicable Does the patient have any new or worsening symptoms? ---No Please document clinical information provided and list any resource used. ---Told caller that a message would be send to PCP to review  and have his nurse call her back. Guidelines Guideline Title Affirmed Question Affirmed Notes Nurse Date/Time (Eastern Time) Disp. Time Eilene Ghazi Time) Disposition Final User 08/20/2015 10:04:17 AM Clinical Call Yes Ronnald Ramp, RN, Jeannetta Nap

## 2015-08-20 NOTE — Telephone Encounter (Signed)
I put in a future order for UA and spoke with pt.

## 2015-08-21 ENCOUNTER — Other Ambulatory Visit (INDEPENDENT_AMBULATORY_CARE_PROVIDER_SITE_OTHER): Payer: BLUE CROSS/BLUE SHIELD

## 2015-08-21 DIAGNOSIS — N390 Urinary tract infection, site not specified: Secondary | ICD-10-CM | POA: Diagnosis not present

## 2015-08-21 LAB — POC URINALSYSI DIPSTICK (AUTOMATED)
Bilirubin, UA: NEGATIVE
GLUCOSE UA: NEGATIVE
Ketones, UA: NEGATIVE
Leukocytes, UA: NEGATIVE
NITRITE UA: NEGATIVE
PROTEIN UA: NEGATIVE
Spec Grav, UA: 1.015
UROBILINOGEN UA: 0.2
pH, UA: 5.5

## 2015-11-21 ENCOUNTER — Other Ambulatory Visit (INDEPENDENT_AMBULATORY_CARE_PROVIDER_SITE_OTHER): Payer: BLUE CROSS/BLUE SHIELD

## 2015-11-21 DIAGNOSIS — Z0000 Encounter for general adult medical examination without abnormal findings: Secondary | ICD-10-CM | POA: Diagnosis not present

## 2015-11-21 LAB — POC URINALSYSI DIPSTICK (AUTOMATED)
Bilirubin, UA: NEGATIVE
Blood, UA: NEGATIVE
Glucose, UA: NEGATIVE
KETONES UA: NEGATIVE
Leukocytes, UA: NEGATIVE
Nitrite, UA: NEGATIVE
PROTEIN UA: NEGATIVE
SPEC GRAV UA: 1.02
Urobilinogen, UA: 0.2
pH, UA: 5.5

## 2015-11-21 LAB — CBC WITH DIFFERENTIAL/PLATELET
BASOS ABS: 0 10*3/uL (ref 0.0–0.1)
Basophils Relative: 0.7 % (ref 0.0–3.0)
EOS ABS: 0.2 10*3/uL (ref 0.0–0.7)
Eosinophils Relative: 2.8 % (ref 0.0–5.0)
HCT: 42.5 % (ref 36.0–46.0)
HEMOGLOBIN: 14.1 g/dL (ref 12.0–15.0)
LYMPHS PCT: 29.3 % (ref 12.0–46.0)
Lymphs Abs: 1.7 10*3/uL (ref 0.7–4.0)
MCHC: 33.2 g/dL (ref 30.0–36.0)
MCV: 83.9 fl (ref 78.0–100.0)
MONO ABS: 0.5 10*3/uL (ref 0.1–1.0)
Monocytes Relative: 9.2 % (ref 3.0–12.0)
Neutro Abs: 3.4 10*3/uL (ref 1.4–7.7)
Neutrophils Relative %: 58 % (ref 43.0–77.0)
Platelets: 251 10*3/uL (ref 150.0–400.0)
RBC: 5.06 Mil/uL (ref 3.87–5.11)
RDW: 15.6 % — ABNORMAL HIGH (ref 11.5–15.5)
WBC: 5.9 10*3/uL (ref 4.0–10.5)

## 2015-11-21 LAB — LIPID PANEL
CHOL/HDL RATIO: 3
Cholesterol: 183 mg/dL (ref 0–200)
HDL: 55.8 mg/dL (ref >39.00)
LDL CALC: 97 mg/dL (ref 0–99)
NONHDL: 127.02
TRIGLYCERIDES: 151 mg/dL — AB (ref 0.0–149.0)
VLDL: 30.2 mg/dL (ref 0.0–40.0)

## 2015-11-21 LAB — BASIC METABOLIC PANEL
BUN: 12 mg/dL (ref 6–23)
CO2: 27 mEq/L (ref 19–32)
CREATININE: 0.76 mg/dL (ref 0.40–1.20)
Calcium: 9.5 mg/dL (ref 8.4–10.5)
Chloride: 107 mEq/L (ref 96–112)
GFR: 84.58 mL/min (ref >60.00)
Glucose, Bld: 95 mg/dL (ref 70–99)
Potassium: 4.1 mEq/L (ref 3.5–5.1)
Sodium: 143 mEq/L (ref 135–145)

## 2015-11-21 LAB — HEPATIC FUNCTION PANEL
ALK PHOS: 64 U/L (ref 39–117)
ALT: 16 U/L (ref 0–35)
AST: 16 U/L (ref 0–37)
Albumin: 4 g/dL (ref 3.5–5.2)
BILIRUBIN DIRECT: 0.2 mg/dL (ref 0.0–0.3)
BILIRUBIN TOTAL: 1 mg/dL (ref 0.2–1.2)
Total Protein: 6.5 g/dL (ref 6.0–8.3)

## 2015-11-21 LAB — TSH: TSH: 1.46 u[IU]/mL (ref 0.35–4.50)

## 2015-11-28 ENCOUNTER — Encounter: Payer: Self-pay | Admitting: Family Medicine

## 2015-11-28 ENCOUNTER — Ambulatory Visit (INDEPENDENT_AMBULATORY_CARE_PROVIDER_SITE_OTHER): Payer: BLUE CROSS/BLUE SHIELD | Admitting: Family Medicine

## 2015-11-28 VITALS — BP 124/80 | HR 70 | Temp 98.9°F | Ht 64.75 in | Wt 199.0 lb

## 2015-11-28 DIAGNOSIS — Z23 Encounter for immunization: Secondary | ICD-10-CM

## 2015-11-28 DIAGNOSIS — Z0000 Encounter for general adult medical examination without abnormal findings: Secondary | ICD-10-CM

## 2015-11-28 MED ORDER — METOPROLOL SUCCINATE ER 25 MG PO TB24: ORAL_TABLET | ORAL | 3 refills | Status: DC

## 2015-11-28 MED ORDER — MONTELUKAST SODIUM 10 MG PO TABS: 10.0000 mg | ORAL_TABLET | Freq: Every day | ORAL | 3 refills | Status: DC

## 2015-11-28 MED ORDER — POTASSIUM CHLORIDE CRYS ER 10 MEQ PO TBCR: EXTENDED_RELEASE_TABLET | ORAL | 3 refills | Status: DC

## 2015-11-28 MED ORDER — DOXYCYCLINE HYCLATE 100 MG PO CAPS: 100.0000 mg | ORAL_CAPSULE | Freq: Two times a day (BID) | ORAL | 0 refills | Status: AC

## 2015-11-28 MED ORDER — ESOMEPRAZOLE MAGNESIUM 40 MG PO CPDR: DELAYED_RELEASE_CAPSULE | ORAL | 3 refills | Status: DC

## 2015-11-28 MED ORDER — FLUTICASONE PROPIONATE 50 MCG/ACT NA SUSP: NASAL | 3 refills | Status: DC

## 2015-11-28 NOTE — Progress Notes (Signed)
   Subjective:    Patient ID: Olivia Carr, female    DOB: [birth year], 55 y.o.   MRN: [id number]  HPI 55 yr old female for a well exam. Her only complaint today is a tender lump in the left groin that appeared a few weeks ago. It seems to come up and down at times. She has been applying A and D ointment to it.    Review of Systems  Constitutional: Negative.   HENT: Negative.   Eyes: Negative.   Respiratory: Negative.   Cardiovascular: Negative.   Gastrointestinal: Negative.   Genitourinary: Negative for decreased urine volume, difficulty urinating, dyspareunia, dysuria, enuresis, flank pain, frequency, hematuria, pelvic pain and urgency.  Musculoskeletal: Negative.   Skin: Negative.   Neurological: Negative.   Psychiatric/Behavioral: Negative.        Objective:   Physical Exam  Constitutional: She is oriented to person, place, and time. She appears well-developed and well-nourished. No distress.  HENT:  Head: Normocephalic and atraumatic.  Right Ear: External ear normal.  Left Ear: External ear normal.  Nose: Nose normal.  Mouth/Throat: Oropharynx is clear and moist. No oropharyngeal exudate.  Eyes: Conjunctivae and EOM are normal. Pupils are equal, round, and reactive to light. No scleral icterus.  Neck: Normal range of motion. Neck supple. No JVD present. No thyromegaly present.  Cardiovascular: Normal rate, regular rhythm, normal heart sounds and intact distal pulses.  Exam reveals no gallop and no friction rub.   No murmur heard. EKG normal   Pulmonary/Chest: Effort normal and breath sounds normal. No respiratory distress. She has no wheezes. She has no rales. She exhibits no tenderness.  Abdominal: Soft. Bowel sounds are normal. She exhibits no distension and no mass. There is no tenderness. There is no rebound and no guarding.  Musculoskeletal: Normal range of motion. She exhibits no edema or tenderness.  Lymphadenopathy:    She has no cervical adenopathy.    Neurological: She is alert and oriented to person, place, and time. She has normal reflexes. No cranial nerve deficit. She exhibits normal muscle tone. Coordination normal.  Skin: Skin is warm and dry. No rash noted. No erythema.  There is a small tender boil in the left groin area   Psychiatric: She has a normal mood and affect. Her behavior is normal. Judgment and thought content normal.          Assessment & Plan:  Well exam. We discussed diet and exercise. Treat the boil with Doxycycline.  Laurey Morale, MD

## 2015-11-28 NOTE — Progress Notes (Signed)
Pre visit review using our clinic review tool, if applicable. No additional management support is needed unless otherwise documented below in the visit note. 

## 2016-01-03 ENCOUNTER — Other Ambulatory Visit: Payer: Self-pay | Admitting: Family Medicine

## 2016-01-03 DIAGNOSIS — Z1231 Encounter for screening mammogram for malignant neoplasm of breast: Secondary | ICD-10-CM

## 2016-01-09 ENCOUNTER — Other Ambulatory Visit: Payer: Self-pay | Admitting: Family Medicine

## 2016-01-28 HISTORY — PX: KNEE ARTHROSCOPY: SUR90

## 2016-02-06 ENCOUNTER — Ambulatory Visit
Admission: RE | Admit: 2016-02-06 | Discharge: 2016-02-06 | Disposition: A | Discharge status: Home / Self Care | Payer: BLUE CROSS/BLUE SHIELD | Source: Ambulatory Visit | Attending: Family Medicine | Admitting: Family Medicine

## 2016-02-06 DIAGNOSIS — Z1231 Encounter for screening mammogram for malignant neoplasm of breast: Secondary | ICD-10-CM

## 2016-03-07 ENCOUNTER — Encounter: Payer: Self-pay | Admitting: Gynecology

## 2016-03-07 ENCOUNTER — Ambulatory Visit (INDEPENDENT_AMBULATORY_CARE_PROVIDER_SITE_OTHER): Payer: BLUE CROSS/BLUE SHIELD | Admitting: Gynecology

## 2016-03-07 VITALS — BP 126/78 | Wt 199.0 lb

## 2016-03-07 DIAGNOSIS — Z01419 Encounter for gynecological examination (general) (routine) without abnormal findings: Secondary | ICD-10-CM | POA: Diagnosis not present

## 2016-03-07 NOTE — Progress Notes (Signed)
Olivia Carr 08-Oct-1960 [id number]   History:    56 y.o.  for annual gyn exam who is asymptomatic. Her PCP is Dr. Sharlene Motts who is been doing her blood work and all her vaccines are up-to-date. Patient is menopausal and on no hormone replacement therapy.Patient's PCP is Dr. Sharlene Motts who is been doing her blood work. Patient with past history of laparoscopic-assisted vaginal hysterectomy. Patient with no past history of any abnormal Pap smear. All her vaccines are up-to-date. In 2013 her [place] was found to be elevated with a value 128.2.  Patient  Had a colonoscopy were by benign polyps were removed in 2014 she is on a 5 year recall. Baseline bone density study in 2015 was normal. Patient is taking her calcium with vitamin D.  Past medical history,surgical history, family history and social history were all reviewed and documented in the EPIC chart.  Gynecologic History No LMP recorded. Patient has had a hysterectomy. Contraception: status post hysterectomy Last Pap: 2012. Results were: normal Last mammogram: 2018. Results were: normal  Obstetric History OB History  Gravida Para Term Preterm AB Living  3 2 2   1 2   SAB TAB Ectopic Multiple Live Births  1       2    # Outcome Date GA Lbr Len/2nd Weight Sex Delivery Anes PTL Lv  3 SAB           2 Term     F Vag-Spont  N LIV  1 Term     F Vag-Spont  N LIV       ROS: A ROS was performed and pertinent positives and negatives are included in the history.  GENERAL: No fevers or chills. HEENT: No change in vision, no earache, sore throat or sinus congestion. NECK: No pain or stiffness. CARDIOVASCULAR: No chest pain or pressure. No palpitations. PULMONARY: No shortness of breath, cough or wheeze. GASTROINTESTINAL: No abdominal pain, nausea, vomiting or diarrhea, melena or bright red blood per rectum. GENITOURINARY: No urinary frequency, urgency, hesitancy or dysuria. MUSCULOSKELETAL: No joint or muscle pain, no back pain, no recent trauma.  DERMATOLOGIC: No rash, no itching, no lesions. ENDOCRINE: No polyuria, polydipsia, no heat or cold intolerance. No recent change in weight. HEMATOLOGICAL: No anemia or easy bruising or bleeding. NEUROLOGIC: No headache, seizures, numbness, tingling or weakness. PSYCHIATRIC: No depression, no loss of interest in normal activity or change in sleep pattern.     Exam: chaperone present  BP 126/78   Wt 199 lb (90.3 kg)   BMI 33.37 kg/m   Body mass index is 33.37 kg/m.  General appearance : Well developed well nourished female. No acute distress HEENT: Eyes: no retinal hemorrhage or exudates,  Neck supple, trachea midline, no carotid bruits, no thyroidmegaly Lungs: Clear to auscultation, no rhonchi or wheezes, or rib retractions  Heart: Regular rate and rhythm, no murmurs or gallops Breast:Examined in sitting and supine position were symmetrical in appearance, no palpable masses or tenderness,  no skin retraction, no nipple inversion, no nipple discharge, no skin discoloration, no axillary or supraclavicular lymphadenopathy Abdomen: no palpable masses or tenderness, no rebound or guarding Extremities: no edema or skin discoloration or tenderness  Pelvic:  Bartholin, Urethra, Skene Glands: Within normal limits             Vagina: No gross lesions or discharge  Cervix: Absent  Uterus  absent  Adnexa  Without masses or tenderness  Anus and perineum  normal   Rectovaginal  normal sphincter  tone without palpated masses or tenderness             Hemoccult colonoscopy next year     Assessment/Plan:  56 y.o. female for annual exam menopausal on no hormone replacement therapy asymptomatic doing well. Patient is due for colonoscopy and bone density study in 2019. Pap smear not indicated. We discussed importance of calcium vitamin D and weightbearing exercise for osteoporosis prevention. We also discussed importance of monthly breast exams.   Terrance Mass MD, 9:35 AM 03/07/2016

## 2016-06-11 ENCOUNTER — Encounter: Payer: Self-pay | Admitting: Gynecology

## 2016-08-31 ENCOUNTER — Other Ambulatory Visit: Payer: Self-pay | Admitting: Family Medicine

## 2016-10-01 DIAGNOSIS — J301 Allergic rhinitis due to pollen: Secondary | ICD-10-CM | POA: Diagnosis not present

## 2016-10-01 DIAGNOSIS — J3081 Allergic rhinitis due to animal (cat) (dog) hair and dander: Secondary | ICD-10-CM | POA: Diagnosis not present

## 2016-10-01 DIAGNOSIS — J3089 Other allergic rhinitis: Secondary | ICD-10-CM | POA: Diagnosis not present

## 2016-10-08 DIAGNOSIS — J301 Allergic rhinitis due to pollen: Secondary | ICD-10-CM | POA: Diagnosis not present

## 2016-10-08 DIAGNOSIS — J3081 Allergic rhinitis due to animal (cat) (dog) hair and dander: Secondary | ICD-10-CM | POA: Diagnosis not present

## 2016-10-08 DIAGNOSIS — J3089 Other allergic rhinitis: Secondary | ICD-10-CM | POA: Diagnosis not present

## 2016-10-10 DIAGNOSIS — S83242D Other tear of medial meniscus, current injury, left knee, subsequent encounter: Secondary | ICD-10-CM | POA: Diagnosis not present

## 2016-10-11 ENCOUNTER — Other Ambulatory Visit: Payer: Self-pay | Admitting: Family Medicine

## 2016-10-16 ENCOUNTER — Encounter: Payer: Self-pay | Admitting: Family Medicine

## 2016-10-23 ENCOUNTER — Telehealth: Payer: Self-pay | Admitting: Family Medicine

## 2016-10-23 NOTE — Telephone Encounter (Signed)
Pt's mom is moving to [place] in November from [place],  [place]. Would like to know if you will accept her as a pt?  Pt only on bp med and glaucoma eye drops. [place]. Please advise.

## 2016-10-24 DIAGNOSIS — J301 Allergic rhinitis due to pollen: Secondary | ICD-10-CM | POA: Diagnosis not present

## 2016-10-24 DIAGNOSIS — J3081 Allergic rhinitis due to animal (cat) (dog) hair and dander: Secondary | ICD-10-CM | POA: Diagnosis not present

## 2016-10-24 DIAGNOSIS — J3089 Other allergic rhinitis: Secondary | ICD-10-CM | POA: Diagnosis not present

## 2016-10-24 NOTE — Telephone Encounter (Signed)
Yes I can see her, thanks  

## 2016-10-31 DIAGNOSIS — J3089 Other allergic rhinitis: Secondary | ICD-10-CM | POA: Diagnosis not present

## 2016-10-31 DIAGNOSIS — J301 Allergic rhinitis due to pollen: Secondary | ICD-10-CM | POA: Diagnosis not present

## 2016-10-31 DIAGNOSIS — J3081 Allergic rhinitis due to animal (cat) (dog) hair and dander: Secondary | ICD-10-CM | POA: Diagnosis not present

## 2016-11-04 NOTE — Telephone Encounter (Signed)
Pts mother is aware of the above msg from Dr. Sarajane Jews she will wait until she gets moved to the city before making a new pt appointment.

## 2016-11-07 DIAGNOSIS — J3081 Allergic rhinitis due to animal (cat) (dog) hair and dander: Secondary | ICD-10-CM | POA: Diagnosis not present

## 2016-11-07 DIAGNOSIS — J3089 Other allergic rhinitis: Secondary | ICD-10-CM | POA: Diagnosis not present

## 2016-11-07 DIAGNOSIS — J301 Allergic rhinitis due to pollen: Secondary | ICD-10-CM | POA: Diagnosis not present

## 2016-11-08 DIAGNOSIS — R300 Dysuria: Secondary | ICD-10-CM | POA: Diagnosis not present

## 2016-11-08 DIAGNOSIS — R8290 Unspecified abnormal findings in urine: Secondary | ICD-10-CM | POA: Diagnosis not present

## 2016-11-09 DIAGNOSIS — R300 Dysuria: Secondary | ICD-10-CM | POA: Diagnosis not present

## 2016-11-13 DIAGNOSIS — J301 Allergic rhinitis due to pollen: Secondary | ICD-10-CM | POA: Diagnosis not present

## 2016-11-14 DIAGNOSIS — J3089 Other allergic rhinitis: Secondary | ICD-10-CM | POA: Diagnosis not present

## 2016-11-14 DIAGNOSIS — J301 Allergic rhinitis due to pollen: Secondary | ICD-10-CM | POA: Diagnosis not present

## 2016-11-14 DIAGNOSIS — J3081 Allergic rhinitis due to animal (cat) (dog) hair and dander: Secondary | ICD-10-CM | POA: Diagnosis not present

## 2016-11-21 DIAGNOSIS — J3089 Other allergic rhinitis: Secondary | ICD-10-CM | POA: Diagnosis not present

## 2016-11-21 DIAGNOSIS — J301 Allergic rhinitis due to pollen: Secondary | ICD-10-CM | POA: Diagnosis not present

## 2016-11-21 DIAGNOSIS — J3081 Allergic rhinitis due to animal (cat) (dog) hair and dander: Secondary | ICD-10-CM | POA: Diagnosis not present

## 2016-11-24 ENCOUNTER — Ambulatory Visit (INDEPENDENT_AMBULATORY_CARE_PROVIDER_SITE_OTHER): Payer: BLUE CROSS/BLUE SHIELD | Admitting: *Deleted

## 2016-11-24 DIAGNOSIS — J3089 Other allergic rhinitis: Secondary | ICD-10-CM | POA: Diagnosis not present

## 2016-11-24 DIAGNOSIS — Z23 Encounter for immunization: Secondary | ICD-10-CM | POA: Diagnosis not present

## 2016-11-24 DIAGNOSIS — J301 Allergic rhinitis due to pollen: Secondary | ICD-10-CM | POA: Diagnosis not present

## 2016-11-24 DIAGNOSIS — J3081 Allergic rhinitis due to animal (cat) (dog) hair and dander: Secondary | ICD-10-CM | POA: Diagnosis not present

## 2016-11-27 DIAGNOSIS — J3081 Allergic rhinitis due to animal (cat) (dog) hair and dander: Secondary | ICD-10-CM | POA: Diagnosis not present

## 2016-11-27 DIAGNOSIS — J301 Allergic rhinitis due to pollen: Secondary | ICD-10-CM | POA: Diagnosis not present

## 2016-11-27 DIAGNOSIS — J3089 Other allergic rhinitis: Secondary | ICD-10-CM | POA: Diagnosis not present

## 2016-12-01 DIAGNOSIS — J301 Allergic rhinitis due to pollen: Secondary | ICD-10-CM | POA: Diagnosis not present

## 2016-12-01 DIAGNOSIS — J3081 Allergic rhinitis due to animal (cat) (dog) hair and dander: Secondary | ICD-10-CM | POA: Diagnosis not present

## 2016-12-01 DIAGNOSIS — J3089 Other allergic rhinitis: Secondary | ICD-10-CM | POA: Diagnosis not present

## 2016-12-03 DIAGNOSIS — J3089 Other allergic rhinitis: Secondary | ICD-10-CM | POA: Diagnosis not present

## 2016-12-03 DIAGNOSIS — J301 Allergic rhinitis due to pollen: Secondary | ICD-10-CM | POA: Diagnosis not present

## 2016-12-03 DIAGNOSIS — J3081 Allergic rhinitis due to animal (cat) (dog) hair and dander: Secondary | ICD-10-CM | POA: Diagnosis not present

## 2016-12-08 ENCOUNTER — Encounter: Payer: Self-pay | Admitting: Family Medicine

## 2016-12-08 ENCOUNTER — Ambulatory Visit (INDEPENDENT_AMBULATORY_CARE_PROVIDER_SITE_OTHER): Payer: BLUE CROSS/BLUE SHIELD | Admitting: Family Medicine

## 2016-12-08 VITALS — BP 128/76 | Temp 98.4°F | Ht 64.75 in | Wt 199.0 lb

## 2016-12-08 DIAGNOSIS — J3081 Allergic rhinitis due to animal (cat) (dog) hair and dander: Secondary | ICD-10-CM | POA: Diagnosis not present

## 2016-12-08 DIAGNOSIS — Z0000 Encounter for general adult medical examination without abnormal findings: Secondary | ICD-10-CM

## 2016-12-08 DIAGNOSIS — J3089 Other allergic rhinitis: Secondary | ICD-10-CM | POA: Diagnosis not present

## 2016-12-08 DIAGNOSIS — J301 Allergic rhinitis due to pollen: Secondary | ICD-10-CM | POA: Diagnosis not present

## 2016-12-08 LAB — BASIC METABOLIC PANEL
BUN: 12 mg/dL (ref 6–23)
CALCIUM: 9.9 mg/dL (ref 8.4–10.5)
CO2: 30 meq/L (ref 19–32)
Chloride: 105 mEq/L (ref 96–112)
Creatinine, Ser: 0.71 mg/dL (ref 0.40–1.20)
GFR: 91.13 mL/min (ref >60.00)
GLUCOSE: 93 mg/dL (ref 70–99)
Potassium: 4.3 mEq/L (ref 3.5–5.1)
Sodium: 142 mEq/L (ref 135–145)

## 2016-12-08 LAB — POC URINALSYSI DIPSTICK (AUTOMATED)
Bilirubin, UA: NEGATIVE
GLUCOSE UA: NEGATIVE
Ketones, UA: NEGATIVE
Leukocytes, UA: NEGATIVE
Nitrite, UA: NEGATIVE
PH UA: 6 (ref 5.0–8.0)
PROTEIN UA: NEGATIVE
RBC UA: NEGATIVE
UROBILINOGEN UA: 0.2 U/dL

## 2016-12-08 LAB — CBC WITH DIFFERENTIAL/PLATELET
BASOS ABS: 0.1 10*3/uL (ref 0.0–0.1)
Basophils Relative: 0.8 % (ref 0.0–3.0)
EOS ABS: 0.1 10*3/uL (ref 0.0–0.7)
Eosinophils Relative: 2 % (ref 0.0–5.0)
HEMATOCRIT: 43.4 % (ref 36.0–46.0)
HEMOGLOBIN: 14.2 g/dL (ref 12.0–15.0)
LYMPHS PCT: 28.3 % (ref 12.0–46.0)
Lymphs Abs: 1.8 10*3/uL (ref 0.7–4.0)
MCHC: 32.6 g/dL (ref 30.0–36.0)
MCV: 88 fl (ref 78.0–100.0)
MONO ABS: 0.4 10*3/uL (ref 0.1–1.0)
Monocytes Relative: 7.1 % (ref 3.0–12.0)
NEUTROS ABS: 3.8 10*3/uL (ref 1.4–7.7)
Neutrophils Relative %: 61.8 % (ref 43.0–77.0)
PLATELETS: 263 10*3/uL (ref 150.0–400.0)
RBC: 4.93 Mil/uL (ref 3.87–5.11)
RDW: 15.2 % (ref 11.5–15.5)
WBC: 6.2 10*3/uL (ref 4.0–10.5)

## 2016-12-08 LAB — LIPID PANEL
Cholesterol: 196 mg/dL (ref 0–200)
HDL: 57.7 mg/dL (ref >39.00)
LDL Cholesterol: 116 mg/dL — ABNORMAL HIGH (ref 0–99)
NonHDL: 138.12
TRIGLYCERIDES: 111 mg/dL (ref 0.0–149.0)
Total CHOL/HDL Ratio: 3
VLDL: 22.2 mg/dL (ref 0.0–40.0)

## 2016-12-08 LAB — TSH: TSH: 1.55 u[IU]/mL (ref 0.35–4.50)

## 2016-12-08 LAB — HEPATIC FUNCTION PANEL
ALT: 13 U/L (ref 0–35)
AST: 15 U/L (ref 0–37)
Albumin: 3.9 g/dL (ref 3.5–5.2)
Alkaline Phosphatase: 60 U/L (ref 39–117)
Bilirubin, Direct: 0.2 mg/dL (ref 0.0–0.3)
TOTAL PROTEIN: 6.4 g/dL (ref 6.0–8.3)
Total Bilirubin: 1 mg/dL (ref 0.2–1.2)

## 2016-12-08 MED ORDER — POTASSIUM CHLORIDE CRYS ER 10 MEQ PO TBCR: EXTENDED_RELEASE_TABLET | ORAL | 3 refills | Status: DC

## 2016-12-08 MED ORDER — ESOMEPRAZOLE MAGNESIUM 40 MG PO CPDR: 40.0000 mg | DELAYED_RELEASE_CAPSULE | Freq: Every day | ORAL | 3 refills | Status: DC

## 2016-12-08 MED ORDER — METOPROLOL SUCCINATE ER 25 MG PO TB24: ORAL_TABLET | ORAL | 3 refills | Status: DC

## 2016-12-08 NOTE — Progress Notes (Signed)
   Subjective:    Patient ID: Olivia Carr, female    DOB: May 26, 1960, 56 y.o.   MRN: [id number]  HPI Here for a well exam. She feels well.    Review of Systems  Constitutional: Negative.   HENT: Negative.   Eyes: Negative.   Respiratory: Negative.   Cardiovascular: Negative.   Gastrointestinal: Negative.   Genitourinary: Negative for decreased urine volume, difficulty urinating, dyspareunia, dysuria, enuresis, flank pain, frequency, hematuria, pelvic pain and urgency.  Musculoskeletal: Negative.   Skin: Negative.   Neurological: Negative.   Psychiatric/Behavioral: Negative.        Objective:   Physical Exam  Constitutional: She is oriented to person, place, and time. She appears well-developed and well-nourished. No distress.  HENT:  Head: Normocephalic and atraumatic.  Right Ear: External ear normal.  Left Ear: External ear normal.  Nose: Nose normal.  Mouth/Throat: Oropharynx is clear and moist. No oropharyngeal exudate.  Eyes: Conjunctivae and EOM are normal. Pupils are equal, round, and reactive to light. No scleral icterus.  Neck: Normal range of motion. Neck supple. No JVD present. No thyromegaly present.  Cardiovascular: Normal rate, regular rhythm, normal heart sounds and intact distal pulses. Exam reveals no gallop and no friction rub.  No murmur heard. Pulmonary/Chest: Effort normal and breath sounds normal. No respiratory distress. She has no wheezes. She has no rales. She exhibits no tenderness.  Abdominal: Soft. Bowel sounds are normal. She exhibits no distension and no mass. There is no tenderness. There is no rebound and no guarding.  Musculoskeletal: Normal range of motion. She exhibits no edema or tenderness.  Lymphadenopathy:    She has no cervical adenopathy.  Neurological: She is alert and oriented to person, place, and time. She has normal reflexes. No cranial nerve deficit. She exhibits normal muscle tone. Coordination normal.  Skin: Skin is warm  and dry. No rash noted. No erythema.  Psychiatric: She has a normal mood and affect. Her behavior is normal. Judgment and thought content normal.          Assessment & Plan:  Well exam. We discussed diet and exercise. Get fasting labs.  Alysia Penna, MD

## 2016-12-08 NOTE — Addendum Note (Signed)
Addended by: Aggie Hacker A on: 12/08/2016 12:14 PM   Modules accepted: Orders

## 2016-12-08 NOTE — Patient Instructions (Signed)
WE NOW OFFER   [facility] FAST TRACK!!!  SAME DAY Appointments for ACUTE CARE  Such as: Sprains, Injuries, cuts, abrasions, rashes, muscle pain, joint pain, back pain Colds, flu, sore throats, headache, allergies, cough, fever  Ear pain, sinus and eye infections Abdominal pain, nausea, vomiting, diarrhea, upset stomach Animal/insect bites  3 Easy Ways to Schedule: Walk-In Scheduling Call in scheduling Mychart Sign-up: [email or web address]         

## 2016-12-17 DIAGNOSIS — J3089 Other allergic rhinitis: Secondary | ICD-10-CM | POA: Diagnosis not present

## 2016-12-17 DIAGNOSIS — J3081 Allergic rhinitis due to animal (cat) (dog) hair and dander: Secondary | ICD-10-CM | POA: Diagnosis not present

## 2016-12-17 DIAGNOSIS — J301 Allergic rhinitis due to pollen: Secondary | ICD-10-CM | POA: Diagnosis not present

## 2016-12-24 ENCOUNTER — Other Ambulatory Visit: Payer: Self-pay | Admitting: Family Medicine

## 2016-12-24 DIAGNOSIS — J3081 Allergic rhinitis due to animal (cat) (dog) hair and dander: Secondary | ICD-10-CM | POA: Diagnosis not present

## 2016-12-24 DIAGNOSIS — J301 Allergic rhinitis due to pollen: Secondary | ICD-10-CM | POA: Diagnosis not present

## 2016-12-24 DIAGNOSIS — J3089 Other allergic rhinitis: Secondary | ICD-10-CM | POA: Diagnosis not present

## 2016-12-29 DIAGNOSIS — J3089 Other allergic rhinitis: Secondary | ICD-10-CM | POA: Diagnosis not present

## 2016-12-29 DIAGNOSIS — J3081 Allergic rhinitis due to animal (cat) (dog) hair and dander: Secondary | ICD-10-CM | POA: Diagnosis not present

## 2016-12-29 DIAGNOSIS — J301 Allergic rhinitis due to pollen: Secondary | ICD-10-CM | POA: Diagnosis not present

## 2017-01-09 DIAGNOSIS — J3081 Allergic rhinitis due to animal (cat) (dog) hair and dander: Secondary | ICD-10-CM | POA: Diagnosis not present

## 2017-01-09 DIAGNOSIS — J3089 Other allergic rhinitis: Secondary | ICD-10-CM | POA: Diagnosis not present

## 2017-01-09 DIAGNOSIS — J301 Allergic rhinitis due to pollen: Secondary | ICD-10-CM | POA: Diagnosis not present

## 2017-01-13 DIAGNOSIS — J301 Allergic rhinitis due to pollen: Secondary | ICD-10-CM | POA: Diagnosis not present

## 2017-01-13 DIAGNOSIS — J3089 Other allergic rhinitis: Secondary | ICD-10-CM | POA: Diagnosis not present

## 2017-01-15 DIAGNOSIS — M94262 Chondromalacia, left knee: Secondary | ICD-10-CM | POA: Diagnosis not present

## 2017-01-15 DIAGNOSIS — S83242A Other tear of medial meniscus, current injury, left knee, initial encounter: Secondary | ICD-10-CM | POA: Diagnosis not present

## 2017-01-15 DIAGNOSIS — M23222 Derangement of posterior horn of medial meniscus due to old tear or injury, left knee: Secondary | ICD-10-CM | POA: Diagnosis not present

## 2017-01-22 DIAGNOSIS — J3089 Other allergic rhinitis: Secondary | ICD-10-CM | POA: Diagnosis not present

## 2017-01-22 DIAGNOSIS — J3081 Allergic rhinitis due to animal (cat) (dog) hair and dander: Secondary | ICD-10-CM | POA: Diagnosis not present

## 2017-01-22 DIAGNOSIS — J301 Allergic rhinitis due to pollen: Secondary | ICD-10-CM | POA: Diagnosis not present

## 2017-01-28 DIAGNOSIS — S83242D Other tear of medial meniscus, current injury, left knee, subsequent encounter: Secondary | ICD-10-CM | POA: Diagnosis not present

## 2017-01-28 DIAGNOSIS — J3081 Allergic rhinitis due to animal (cat) (dog) hair and dander: Secondary | ICD-10-CM | POA: Diagnosis not present

## 2017-01-28 DIAGNOSIS — J3089 Other allergic rhinitis: Secondary | ICD-10-CM | POA: Diagnosis not present

## 2017-01-28 DIAGNOSIS — J301 Allergic rhinitis due to pollen: Secondary | ICD-10-CM | POA: Diagnosis not present

## 2017-01-29 ENCOUNTER — Other Ambulatory Visit: Payer: Self-pay | Admitting: Family Medicine

## 2017-01-29 DIAGNOSIS — Z1231 Encounter for screening mammogram for malignant neoplasm of breast: Secondary | ICD-10-CM

## 2017-02-05 DIAGNOSIS — J3089 Other allergic rhinitis: Secondary | ICD-10-CM | POA: Diagnosis not present

## 2017-02-05 DIAGNOSIS — J301 Allergic rhinitis due to pollen: Secondary | ICD-10-CM | POA: Diagnosis not present

## 2017-02-05 DIAGNOSIS — J3081 Allergic rhinitis due to animal (cat) (dog) hair and dander: Secondary | ICD-10-CM | POA: Diagnosis not present

## 2017-02-08 ENCOUNTER — Encounter: Payer: Self-pay | Admitting: Family Medicine

## 2017-02-10 ENCOUNTER — Other Ambulatory Visit: Payer: Self-pay

## 2017-02-10 MED ORDER — MONTELUKAST SODIUM 10 MG PO TABS: 10.0000 mg | ORAL_TABLET | Freq: Every day | ORAL | 2 refills | Status: DC

## 2017-02-10 MED ORDER — FLUTICASONE PROPIONATE 50 MCG/ACT NA SUSP: NASAL | 2 refills | Status: DC

## 2017-02-13 DIAGNOSIS — J3089 Other allergic rhinitis: Secondary | ICD-10-CM | POA: Diagnosis not present

## 2017-02-13 DIAGNOSIS — J301 Allergic rhinitis due to pollen: Secondary | ICD-10-CM | POA: Diagnosis not present

## 2017-02-13 DIAGNOSIS — J3081 Allergic rhinitis due to animal (cat) (dog) hair and dander: Secondary | ICD-10-CM | POA: Diagnosis not present

## 2017-02-18 ENCOUNTER — Ambulatory Visit: Payer: Self-pay

## 2017-02-18 DIAGNOSIS — J3089 Other allergic rhinitis: Secondary | ICD-10-CM | POA: Diagnosis not present

## 2017-02-18 DIAGNOSIS — J301 Allergic rhinitis due to pollen: Secondary | ICD-10-CM | POA: Diagnosis not present

## 2017-02-18 DIAGNOSIS — J3081 Allergic rhinitis due to animal (cat) (dog) hair and dander: Secondary | ICD-10-CM | POA: Diagnosis not present

## 2017-02-25 DIAGNOSIS — J3089 Other allergic rhinitis: Secondary | ICD-10-CM | POA: Diagnosis not present

## 2017-02-25 DIAGNOSIS — J3081 Allergic rhinitis due to animal (cat) (dog) hair and dander: Secondary | ICD-10-CM | POA: Diagnosis not present

## 2017-02-25 DIAGNOSIS — S83242D Other tear of medial meniscus, current injury, left knee, subsequent encounter: Secondary | ICD-10-CM | POA: Diagnosis not present

## 2017-02-25 DIAGNOSIS — J301 Allergic rhinitis due to pollen: Secondary | ICD-10-CM | POA: Diagnosis not present

## 2017-03-09 ENCOUNTER — Encounter: Payer: Self-pay | Admitting: Obstetrics & Gynecology

## 2017-03-09 ENCOUNTER — Ambulatory Visit (INDEPENDENT_AMBULATORY_CARE_PROVIDER_SITE_OTHER): Payer: BLUE CROSS/BLUE SHIELD | Admitting: Obstetrics & Gynecology

## 2017-03-09 VITALS — BP 128/82 | Ht 65.0 in | Wt 205.0 lb

## 2017-03-09 DIAGNOSIS — Z780 Asymptomatic menopausal state: Secondary | ICD-10-CM

## 2017-03-09 DIAGNOSIS — J3089 Other allergic rhinitis: Secondary | ICD-10-CM | POA: Diagnosis not present

## 2017-03-09 DIAGNOSIS — J301 Allergic rhinitis due to pollen: Secondary | ICD-10-CM | POA: Diagnosis not present

## 2017-03-09 DIAGNOSIS — Z90710 Acquired absence of both cervix and uterus: Secondary | ICD-10-CM | POA: Diagnosis not present

## 2017-03-09 DIAGNOSIS — Z01411 Encounter for gynecological examination (general) (routine) with abnormal findings: Secondary | ICD-10-CM | POA: Diagnosis not present

## 2017-03-09 DIAGNOSIS — J3081 Allergic rhinitis due to animal (cat) (dog) hair and dander: Secondary | ICD-10-CM | POA: Diagnosis not present

## 2017-03-09 NOTE — Addendum Note (Signed)
Addended by: Thurnell Garbe A on: 03/09/2017 10:29 AM   Modules accepted: Orders

## 2017-03-09 NOTE — Patient Instructions (Signed)
1. Encounter for gynecological examination with abnormal finding Gynecologic exam status post LAVH.  Pap reflex done on the vaginal vault.  Breast exam normal.  Has screening mammogram scheduled this week.  Will schedule screening colonoscopy this year.  Health labs with family physician.  2. Hx of total hysterectomy   3. Menopause present Well without hormone replacement therapy.  Suggested coconut oil as needed with intercourse.  Last bone density February 2017 was completely normal.  Will repeat bone density at 3 years in 2020.  Vitamin D supplements, calcium rich nutrition and weightbearing physical activity recommended.  Olivia Carr, it was a pleasure meeting you today!  I will inform you of your results as soon as they are available.   Health Maintenance for Postmenopausal Women Menopause is a normal process in which your reproductive ability comes to an end. This process happens gradually over a span of months to years, usually between the ages of 73 and 42. Menopause is complete when you have missed 12 consecutive menstrual periods. It is important to talk with your health care provider about some of the most common conditions that affect postmenopausal women, such as heart disease, cancer, and bone loss (osteoporosis). Adopting a healthy lifestyle and getting preventive care can help to promote your health and wellness. Those actions can also lower your chances of developing some of these common conditions. What should I know about menopause? During menopause, you may experience a number of symptoms, such as:  Moderate-to-severe hot flashes.  Night sweats.  Decrease in sex drive.  Mood swings.  Headaches.  Tiredness.  Irritability.  Memory problems.  Insomnia.  Choosing to treat or not to treat menopausal changes is an individual decision that you make with your health care provider. What should I know about hormone replacement therapy and supplements? Hormone therapy  products are effective for treating symptoms that are associated with menopause, such as hot flashes and night sweats. Hormone replacement carries certain risks, especially as you become older. If you are thinking about using estrogen or estrogen with progestin treatments, discuss the benefits and risks with your health care provider. What should I know about heart disease and stroke? Heart disease, heart attack, and stroke become more likely as you age. This may be due, in part, to the hormonal changes that your body experiences during menopause. These can affect how your body processes dietary fats, triglycerides, and cholesterol. Heart attack and stroke are both medical emergencies. There are many things that you can do to help prevent heart disease and stroke:  Have your blood pressure checked at least every 1-2 years. High blood pressure causes heart disease and increases the risk of stroke.  If you are 8-60 years old, ask your health care provider if you should take aspirin to prevent a heart attack or a stroke.  Do not use any tobacco products, including cigarettes, chewing tobacco, or electronic cigarettes. If you need help quitting, ask your health care provider.  It is important to eat a healthy diet and maintain a healthy weight. ? Be sure to include plenty of vegetables, fruits, low-fat dairy products, and lean protein. ? Avoid eating foods that are high in solid fats, added sugars, or salt (sodium).  Get regular exercise. This is one of the most important things that you can do for your health. ? Try to exercise for at least 150 minutes each week. The type of exercise that you do should increase your heart rate and make you sweat. This is known as  moderate-intensity exercise. ? Try to do strengthening exercises at least twice each week. Do these in addition to the moderate-intensity exercise.  Know your numbers.Ask your health care provider to check your cholesterol and your blood  glucose. Continue to have your blood tested as directed by your health care provider.  What should I know about cancer screening? There are several types of cancer. Take the following steps to reduce your risk and to catch any cancer development as early as possible. Breast Cancer  Practice breast self-awareness. ? This means understanding how your breasts normally appear and feel. ? It also means doing regular breast self-exams. Let your health care provider know about any changes, no matter how small.  If you are 96 or older, have a clinician do a breast exam (clinical breast exam or CBE) every year. Depending on your age, family history, and medical history, it may be recommended that you also have a yearly breast X-ray (mammogram).  If you have a family history of breast cancer, talk with your health care provider about genetic screening.  If you are at high risk for breast cancer, talk with your health care provider about having an MRI and a mammogram every year.  Breast cancer (BRCA) gene test is recommended for women who have family members with BRCA-related cancers. Results of the assessment will determine the need for genetic counseling and BRCA1 and for BRCA2 testing. BRCA-related cancers include these types: ? Breast. This occurs in males or females. ? Ovarian. ? Tubal. This may also be called fallopian tube cancer. ? Cancer of the abdominal or pelvic lining (peritoneal cancer). ? Prostate. ? Pancreatic.  Cervical, Uterine, and Ovarian Cancer Your health care provider may recommend that you be screened regularly for cancer of the pelvic organs. These include your ovaries, uterus, and vagina. This screening involves a pelvic exam, which includes checking for microscopic changes to the surface of your cervix (Pap test).  For women ages 21-65, health care providers may recommend a pelvic exam and a Pap test every three years. For women ages 59-65, they may recommend the Pap test  and pelvic exam, combined with testing for human papilloma virus (HPV), every five years. Some types of HPV increase your risk of cervical cancer. Testing for HPV may also be done on women of any age who have unclear Pap test results.  Other health care providers may not recommend any screening for nonpregnant women who are considered low risk for pelvic cancer and have no symptoms. Ask your health care provider if a screening pelvic exam is right for you.  If you have had past treatment for cervical cancer or a condition that could lead to cancer, you need Pap tests and screening for cancer for at least 20 years after your treatment. If Pap tests have been discontinued for you, your risk factors (such as having a new sexual partner) need to be reassessed to determine if you should start having screenings again. Some women have medical problems that increase the chance of getting cervical cancer. In these cases, your health care provider may recommend that you have screening and Pap tests more often.  If you have a family history of uterine cancer or ovarian cancer, talk with your health care provider about genetic screening.  If you have vaginal bleeding after reaching menopause, tell your health care provider.  There are currently no reliable tests available to screen for ovarian cancer.  Lung Cancer Lung cancer screening is recommended for adults 55-80 years  old who are at high risk for lung cancer because of a history of smoking. A yearly low-dose CT scan of the lungs is recommended if you:  Currently smoke.  Have a history of at least 30 pack-years of smoking and you currently smoke or have quit within the past 15 years. A pack-year is smoking an average of one pack of cigarettes per day for one year.  Yearly screening should:  Continue until it has been 15 years since you quit.  Stop if you develop a health problem that would prevent you from having lung cancer treatment.  Colorectal  Cancer  This type of cancer can be detected and can often be prevented.  Routine colorectal cancer screening usually begins at age 25 and continues through age 97.  If you have risk factors for colon cancer, your health care provider may recommend that you be screened at an earlier age.  If you have a family history of colorectal cancer, talk with your health care provider about genetic screening.  Your health care provider may also recommend using home test kits to check for hidden blood in your stool.  A small camera at the end of a tube can be used to examine your colon directly (sigmoidoscopy or colonoscopy). This is done to check for the earliest forms of colorectal cancer.  Direct examination of the colon should be repeated every 5-10 years until age 28. However, if early forms of precancerous polyps or small growths are found or if you have a family history or genetic risk for colorectal cancer, you may need to be screened more often.  Skin Cancer  Check your skin from head to toe regularly.  Monitor any moles. Be sure to tell your health care provider: ? About any new moles or changes in moles, especially if there is a change in a mole's shape or color. ? If you have a mole that is larger than the size of a pencil eraser.  If any of your family members has a history of skin cancer, especially at a young age, talk with your health care provider about genetic screening.  Always use sunscreen. Apply sunscreen liberally and repeatedly throughout the day.  Whenever you are outside, protect yourself by wearing long sleeves, pants, a wide-brimmed hat, and sunglasses.  What should I know about osteoporosis? Osteoporosis is a condition in which bone destruction happens more quickly than new bone creation. After menopause, you may be at an increased risk for osteoporosis. To help prevent osteoporosis or the bone fractures that can happen because of osteoporosis, the following is  recommended:  If you are 30-16 years old, get at least 1,000 mg of calcium and at least 600 mg of vitamin D per day.  If you are older than age 76 but younger than age 75, get at least 1,200 mg of calcium and at least 600 mg of vitamin D per day.  If you are older than age 28, get at least 1,200 mg of calcium and at least 800 mg of vitamin D per day.  Smoking and excessive alcohol intake increase the risk of osteoporosis. Eat foods that are rich in calcium and vitamin D, and do weight-bearing exercises several times each week as directed by your health care provider. What should I know about how menopause affects my mental health? Depression may occur at any age, but it is more common as you become older. Common symptoms of depression include:  Low or sad mood.  Changes in sleep  patterns.  Changes in appetite or eating patterns.  Feeling an overall lack of motivation or enjoyment of activities that you previously enjoyed.  Frequent crying spells.  Talk with your health care provider if you think that you are experiencing depression. What should I know about immunizations? It is important that you get and maintain your immunizations. These include:  Tetanus, diphtheria, and pertussis (Tdap) booster vaccine.  Influenza every year before the flu season begins.  Pneumonia vaccine.  Shingles vaccine.  Your health care provider may also recommend other immunizations. This information is not intended to replace advice given to you by your health care provider. Make sure you discuss any questions you have with your health care provider. Document Released: 03/07/2005 Document Revised: 08/03/2015 Document Reviewed: 10/17/2014 Elsevier Interactive Patient Education  2018 [employer].

## 2017-03-09 NOTE — Progress Notes (Signed)
Olivia Carr [birth year] [id number]   History:    57 y.o. G3P2A1L2 Married.  2 daughters 45 (married) and 30 yo.  RP:  Established patient presenting for annual gyn exam   HPI: S/P LAVH.  Menopause, well on no HRT.  No pelvic pain.  Normal vaginal secretions.  No pain with intercourse.  Urine and bowel movements normal.  Breasts normal.  Body mass index 34.11.  Will start back with regular physical activity post left knee arthroscopy for meniscal tear.  Health labs with Dr. Sharlene Motts.  Past medical history,surgical history, family history and social history were all reviewed and documented in the EPIC chart.  Gynecologic History No LMP recorded. Patient has had a hysterectomy. Contraception: status post hysterectomy Last Pap: 2012. Results were: normal Last mammogram: 01/2016. Results were: Negative.  Scheduled this week. Bone Density: 02/2015 completely normal.  Will repeat at 3 yrs 2020. Colonoscopy: 2014 with benign polyps.  Will schedule this year.  Obstetric History OB History  Gravida Para Term Preterm AB Living  3 2 2   1 2   SAB TAB Ectopic Multiple Live Births  1       2    # Outcome Date GA Lbr Len/2nd Weight Sex Delivery Anes PTL Lv  3 SAB           2 Term     F Vag-Spont  N LIV  1 Term     F Vag-Spont  N LIV       ROS: A ROS was performed and pertinent positives and negatives are included in the history.  GENERAL: No fevers or chills. HEENT: No change in vision, no earache, sore throat or sinus congestion. NECK: No pain or stiffness. CARDIOVASCULAR: No chest pain or pressure. No palpitations. PULMONARY: No shortness of breath, cough or wheeze. GASTROINTESTINAL: No abdominal pain, nausea, vomiting or diarrhea, melena or bright red blood per rectum. GENITOURINARY: No urinary frequency, urgency, hesitancy or dysuria. MUSCULOSKELETAL: No joint or muscle pain, no back pain, no recent trauma. DERMATOLOGIC: No rash, no itching, no lesions. ENDOCRINE: No polyuria, polydipsia,  no heat or cold intolerance. No recent change in weight. HEMATOLOGICAL: No anemia or easy bruising or bleeding. NEUROLOGIC: No headache, seizures, numbness, tingling or weakness. PSYCHIATRIC: No depression, no loss of interest in normal activity or change in sleep pattern.     Exam:   BP 128/82 (BP Location: Right Leg, Patient Position: Sitting, Cuff Size: Normal)   Ht 5\' 5"  (1.651 m)   Wt 205 lb (93 kg)   BMI 34.11 kg/m   Body mass index is 34.11 kg/m.  General appearance : Well developed well nourished female. No acute distress HEENT: Eyes: no retinal hemorrhage or exudates,  Neck supple, trachea midline, no carotid bruits, no thyroidmegaly Lungs: Clear to auscultation, no rhonchi or wheezes, or rib retractions  Heart: Regular rate and rhythm, no murmurs or gallops Breast:Examined in sitting and supine position were symmetrical in appearance, no palpable masses or tenderness,  no skin retraction, no nipple inversion, no nipple discharge, no skin discoloration, no axillary or supraclavicular lymphadenopathy Abdomen: no palpable masses or tenderness, no rebound or guarding Extremities: no edema or skin discoloration or tenderness  Pelvic: Vulva: Normal             Vagina: No gross lesions or discharge.  Pap reflex done.  Cervix/Uterus absent  Adnexa  Without masses or tenderness  Anus: Normal   Assessment/Plan:  57 y.o. female for annual exam   1.  Encounter for gynecological examination with abnormal finding Gynecologic exam status post LAVH.  Pap reflex done on the vaginal vault.  Breast exam normal.  Has screening mammogram scheduled this week.  Will schedule screening colonoscopy this year.  Health labs with family physician.  2. Hx of total hysterectomy   3. Menopause present Well without hormone replacement therapy.  Suggested coconut oil as needed with intercourse.  Last bone density February 2017 was completely normal.  Will repeat bone density at 3 years in 2020.   Vitamin D supplements, calcium rich nutrition and weightbearing physical activity recommended.  Princess Bruins MD, 8:50 AM 03/09/2017

## 2017-03-12 LAB — PAP IG W/ RFLX HPV ASCU

## 2017-03-13 ENCOUNTER — Ambulatory Visit
Admission: RE | Admit: 2017-03-13 | Discharge: 2017-03-13 | Disposition: A | Discharge status: Home / Self Care | Payer: BLUE CROSS/BLUE SHIELD | Source: Ambulatory Visit | Attending: Family Medicine | Admitting: Family Medicine

## 2017-03-13 DIAGNOSIS — Z1231 Encounter for screening mammogram for malignant neoplasm of breast: Secondary | ICD-10-CM

## 2017-03-16 ENCOUNTER — Other Ambulatory Visit: Payer: Self-pay | Admitting: Family Medicine

## 2017-03-16 DIAGNOSIS — R928 Other abnormal and inconclusive findings on diagnostic imaging of breast: Secondary | ICD-10-CM

## 2017-03-18 ENCOUNTER — Ambulatory Visit: Admission: RE | Admit: 2017-03-18 | Payer: BLUE CROSS/BLUE SHIELD | Source: Ambulatory Visit

## 2017-03-18 ENCOUNTER — Ambulatory Visit
Admission: RE | Admit: 2017-03-18 | Discharge: 2017-03-18 | Disposition: A | Discharge status: Home / Self Care | Payer: BLUE CROSS/BLUE SHIELD | Source: Ambulatory Visit | Attending: Family Medicine | Admitting: Family Medicine

## 2017-03-18 ENCOUNTER — Other Ambulatory Visit: Payer: Self-pay | Admitting: Family Medicine

## 2017-03-18 DIAGNOSIS — R928 Other abnormal and inconclusive findings on diagnostic imaging of breast: Secondary | ICD-10-CM

## 2017-03-18 DIAGNOSIS — N6002 Solitary cyst of left breast: Secondary | ICD-10-CM | POA: Diagnosis not present

## 2017-03-19 DIAGNOSIS — J3089 Other allergic rhinitis: Secondary | ICD-10-CM | POA: Diagnosis not present

## 2017-03-19 DIAGNOSIS — J301 Allergic rhinitis due to pollen: Secondary | ICD-10-CM | POA: Diagnosis not present

## 2017-03-19 DIAGNOSIS — J3081 Allergic rhinitis due to animal (cat) (dog) hair and dander: Secondary | ICD-10-CM | POA: Diagnosis not present

## 2017-03-23 DIAGNOSIS — J3089 Other allergic rhinitis: Secondary | ICD-10-CM | POA: Diagnosis not present

## 2017-03-23 DIAGNOSIS — J3081 Allergic rhinitis due to animal (cat) (dog) hair and dander: Secondary | ICD-10-CM | POA: Diagnosis not present

## 2017-03-23 DIAGNOSIS — J301 Allergic rhinitis due to pollen: Secondary | ICD-10-CM | POA: Diagnosis not present

## 2017-04-01 DIAGNOSIS — S0502XA Injury of conjunctiva and corneal abrasion without foreign body, left eye, initial encounter: Secondary | ICD-10-CM | POA: Diagnosis not present

## 2017-04-03 DIAGNOSIS — J3081 Allergic rhinitis due to animal (cat) (dog) hair and dander: Secondary | ICD-10-CM | POA: Diagnosis not present

## 2017-04-03 DIAGNOSIS — J301 Allergic rhinitis due to pollen: Secondary | ICD-10-CM | POA: Diagnosis not present

## 2017-04-03 DIAGNOSIS — J3089 Other allergic rhinitis: Secondary | ICD-10-CM | POA: Diagnosis not present

## 2017-04-08 DIAGNOSIS — J3089 Other allergic rhinitis: Secondary | ICD-10-CM | POA: Diagnosis not present

## 2017-04-08 DIAGNOSIS — J301 Allergic rhinitis due to pollen: Secondary | ICD-10-CM | POA: Diagnosis not present

## 2017-04-08 DIAGNOSIS — J3081 Allergic rhinitis due to animal (cat) (dog) hair and dander: Secondary | ICD-10-CM | POA: Diagnosis not present

## 2017-04-10 DIAGNOSIS — J301 Allergic rhinitis due to pollen: Secondary | ICD-10-CM | POA: Diagnosis not present

## 2017-04-13 DIAGNOSIS — J3089 Other allergic rhinitis: Secondary | ICD-10-CM | POA: Diagnosis not present

## 2017-04-13 DIAGNOSIS — J3081 Allergic rhinitis due to animal (cat) (dog) hair and dander: Secondary | ICD-10-CM | POA: Diagnosis not present

## 2017-04-15 DIAGNOSIS — J301 Allergic rhinitis due to pollen: Secondary | ICD-10-CM | POA: Diagnosis not present

## 2017-04-15 DIAGNOSIS — J3089 Other allergic rhinitis: Secondary | ICD-10-CM | POA: Diagnosis not present

## 2017-04-15 DIAGNOSIS — J3081 Allergic rhinitis due to animal (cat) (dog) hair and dander: Secondary | ICD-10-CM | POA: Diagnosis not present

## 2017-04-16 DIAGNOSIS — H18832 Recurrent erosion of cornea, left eye: Secondary | ICD-10-CM | POA: Diagnosis not present

## 2017-04-24 DIAGNOSIS — J3089 Other allergic rhinitis: Secondary | ICD-10-CM | POA: Diagnosis not present

## 2017-04-24 DIAGNOSIS — J3081 Allergic rhinitis due to animal (cat) (dog) hair and dander: Secondary | ICD-10-CM | POA: Diagnosis not present

## 2017-04-24 DIAGNOSIS — J301 Allergic rhinitis due to pollen: Secondary | ICD-10-CM | POA: Diagnosis not present

## 2017-04-27 DIAGNOSIS — J301 Allergic rhinitis due to pollen: Secondary | ICD-10-CM | POA: Diagnosis not present

## 2017-04-27 DIAGNOSIS — J3081 Allergic rhinitis due to animal (cat) (dog) hair and dander: Secondary | ICD-10-CM | POA: Diagnosis not present

## 2017-04-27 DIAGNOSIS — J3089 Other allergic rhinitis: Secondary | ICD-10-CM | POA: Diagnosis not present

## 2017-04-29 DIAGNOSIS — J3089 Other allergic rhinitis: Secondary | ICD-10-CM | POA: Diagnosis not present

## 2017-04-29 DIAGNOSIS — J301 Allergic rhinitis due to pollen: Secondary | ICD-10-CM | POA: Diagnosis not present

## 2017-04-29 DIAGNOSIS — J3081 Allergic rhinitis due to animal (cat) (dog) hair and dander: Secondary | ICD-10-CM | POA: Diagnosis not present

## 2017-05-06 DIAGNOSIS — J301 Allergic rhinitis due to pollen: Secondary | ICD-10-CM | POA: Diagnosis not present

## 2017-05-06 DIAGNOSIS — J3081 Allergic rhinitis due to animal (cat) (dog) hair and dander: Secondary | ICD-10-CM | POA: Diagnosis not present

## 2017-05-06 DIAGNOSIS — J3089 Other allergic rhinitis: Secondary | ICD-10-CM | POA: Diagnosis not present

## 2017-05-11 DIAGNOSIS — J3081 Allergic rhinitis due to animal (cat) (dog) hair and dander: Secondary | ICD-10-CM | POA: Diagnosis not present

## 2017-05-11 DIAGNOSIS — J301 Allergic rhinitis due to pollen: Secondary | ICD-10-CM | POA: Diagnosis not present

## 2017-05-11 DIAGNOSIS — J3089 Other allergic rhinitis: Secondary | ICD-10-CM | POA: Diagnosis not present

## 2017-05-13 DIAGNOSIS — J301 Allergic rhinitis due to pollen: Secondary | ICD-10-CM | POA: Diagnosis not present

## 2017-05-20 DIAGNOSIS — J3089 Other allergic rhinitis: Secondary | ICD-10-CM | POA: Diagnosis not present

## 2017-05-20 DIAGNOSIS — J3081 Allergic rhinitis due to animal (cat) (dog) hair and dander: Secondary | ICD-10-CM | POA: Diagnosis not present

## 2017-05-20 DIAGNOSIS — J301 Allergic rhinitis due to pollen: Secondary | ICD-10-CM | POA: Diagnosis not present

## 2017-05-22 DIAGNOSIS — J3081 Allergic rhinitis due to animal (cat) (dog) hair and dander: Secondary | ICD-10-CM | POA: Diagnosis not present

## 2017-05-22 DIAGNOSIS — J3089 Other allergic rhinitis: Secondary | ICD-10-CM | POA: Diagnosis not present

## 2017-05-22 DIAGNOSIS — J301 Allergic rhinitis due to pollen: Secondary | ICD-10-CM | POA: Diagnosis not present

## 2017-05-29 DIAGNOSIS — J3081 Allergic rhinitis due to animal (cat) (dog) hair and dander: Secondary | ICD-10-CM | POA: Diagnosis not present

## 2017-05-29 DIAGNOSIS — J3089 Other allergic rhinitis: Secondary | ICD-10-CM | POA: Diagnosis not present

## 2017-05-29 DIAGNOSIS — J301 Allergic rhinitis due to pollen: Secondary | ICD-10-CM | POA: Diagnosis not present

## 2017-06-05 DIAGNOSIS — J301 Allergic rhinitis due to pollen: Secondary | ICD-10-CM | POA: Diagnosis not present

## 2017-06-05 DIAGNOSIS — J3081 Allergic rhinitis due to animal (cat) (dog) hair and dander: Secondary | ICD-10-CM | POA: Diagnosis not present

## 2017-06-05 DIAGNOSIS — J3089 Other allergic rhinitis: Secondary | ICD-10-CM | POA: Diagnosis not present

## 2017-06-10 DIAGNOSIS — J301 Allergic rhinitis due to pollen: Secondary | ICD-10-CM | POA: Diagnosis not present

## 2017-06-10 DIAGNOSIS — J3089 Other allergic rhinitis: Secondary | ICD-10-CM | POA: Diagnosis not present

## 2017-06-10 DIAGNOSIS — J3081 Allergic rhinitis due to animal (cat) (dog) hair and dander: Secondary | ICD-10-CM | POA: Diagnosis not present

## 2017-06-19 DIAGNOSIS — J3089 Other allergic rhinitis: Secondary | ICD-10-CM | POA: Diagnosis not present

## 2017-06-19 DIAGNOSIS — J301 Allergic rhinitis due to pollen: Secondary | ICD-10-CM | POA: Diagnosis not present

## 2017-06-19 DIAGNOSIS — J3081 Allergic rhinitis due to animal (cat) (dog) hair and dander: Secondary | ICD-10-CM | POA: Diagnosis not present

## 2017-06-26 DIAGNOSIS — J3089 Other allergic rhinitis: Secondary | ICD-10-CM | POA: Diagnosis not present

## 2017-06-26 DIAGNOSIS — J3081 Allergic rhinitis due to animal (cat) (dog) hair and dander: Secondary | ICD-10-CM | POA: Diagnosis not present

## 2017-06-26 DIAGNOSIS — J301 Allergic rhinitis due to pollen: Secondary | ICD-10-CM | POA: Diagnosis not present

## 2017-07-02 DIAGNOSIS — J3089 Other allergic rhinitis: Secondary | ICD-10-CM | POA: Diagnosis not present

## 2017-07-02 DIAGNOSIS — J3081 Allergic rhinitis due to animal (cat) (dog) hair and dander: Secondary | ICD-10-CM | POA: Diagnosis not present

## 2017-07-02 DIAGNOSIS — J301 Allergic rhinitis due to pollen: Secondary | ICD-10-CM | POA: Diagnosis not present

## 2017-07-07 DIAGNOSIS — J3089 Other allergic rhinitis: Secondary | ICD-10-CM | POA: Diagnosis not present

## 2017-07-07 DIAGNOSIS — J301 Allergic rhinitis due to pollen: Secondary | ICD-10-CM | POA: Diagnosis not present

## 2017-07-07 DIAGNOSIS — J3081 Allergic rhinitis due to animal (cat) (dog) hair and dander: Secondary | ICD-10-CM | POA: Diagnosis not present

## 2017-07-07 DIAGNOSIS — J4520 Mild intermittent asthma, uncomplicated: Secondary | ICD-10-CM | POA: Diagnosis not present

## 2017-07-16 DIAGNOSIS — J3081 Allergic rhinitis due to animal (cat) (dog) hair and dander: Secondary | ICD-10-CM | POA: Diagnosis not present

## 2017-07-16 DIAGNOSIS — J301 Allergic rhinitis due to pollen: Secondary | ICD-10-CM | POA: Diagnosis not present

## 2017-07-16 DIAGNOSIS — J3089 Other allergic rhinitis: Secondary | ICD-10-CM | POA: Diagnosis not present

## 2017-07-21 DIAGNOSIS — J301 Allergic rhinitis due to pollen: Secondary | ICD-10-CM | POA: Diagnosis not present

## 2017-07-21 DIAGNOSIS — J3081 Allergic rhinitis due to animal (cat) (dog) hair and dander: Secondary | ICD-10-CM | POA: Diagnosis not present

## 2017-07-21 DIAGNOSIS — J3089 Other allergic rhinitis: Secondary | ICD-10-CM | POA: Diagnosis not present

## 2017-07-29 DIAGNOSIS — J3081 Allergic rhinitis due to animal (cat) (dog) hair and dander: Secondary | ICD-10-CM | POA: Diagnosis not present

## 2017-07-29 DIAGNOSIS — J301 Allergic rhinitis due to pollen: Secondary | ICD-10-CM | POA: Diagnosis not present

## 2017-07-29 DIAGNOSIS — J3089 Other allergic rhinitis: Secondary | ICD-10-CM | POA: Diagnosis not present

## 2017-08-05 DIAGNOSIS — J3089 Other allergic rhinitis: Secondary | ICD-10-CM | POA: Diagnosis not present

## 2017-08-05 DIAGNOSIS — J3081 Allergic rhinitis due to animal (cat) (dog) hair and dander: Secondary | ICD-10-CM | POA: Diagnosis not present

## 2017-08-05 DIAGNOSIS — J301 Allergic rhinitis due to pollen: Secondary | ICD-10-CM | POA: Diagnosis not present

## 2017-08-12 DIAGNOSIS — J301 Allergic rhinitis due to pollen: Secondary | ICD-10-CM | POA: Diagnosis not present

## 2017-08-12 DIAGNOSIS — J3089 Other allergic rhinitis: Secondary | ICD-10-CM | POA: Diagnosis not present

## 2017-08-12 DIAGNOSIS — J3081 Allergic rhinitis due to animal (cat) (dog) hair and dander: Secondary | ICD-10-CM | POA: Diagnosis not present

## 2017-08-19 DIAGNOSIS — J3081 Allergic rhinitis due to animal (cat) (dog) hair and dander: Secondary | ICD-10-CM | POA: Diagnosis not present

## 2017-08-19 DIAGNOSIS — J3089 Other allergic rhinitis: Secondary | ICD-10-CM | POA: Diagnosis not present

## 2017-08-19 DIAGNOSIS — J301 Allergic rhinitis due to pollen: Secondary | ICD-10-CM | POA: Diagnosis not present

## 2017-08-26 DIAGNOSIS — J3089 Other allergic rhinitis: Secondary | ICD-10-CM | POA: Diagnosis not present

## 2017-08-26 DIAGNOSIS — J3081 Allergic rhinitis due to animal (cat) (dog) hair and dander: Secondary | ICD-10-CM | POA: Diagnosis not present

## 2017-08-26 DIAGNOSIS — J301 Allergic rhinitis due to pollen: Secondary | ICD-10-CM | POA: Diagnosis not present

## 2017-09-08 DIAGNOSIS — J301 Allergic rhinitis due to pollen: Secondary | ICD-10-CM | POA: Diagnosis not present

## 2017-09-08 DIAGNOSIS — J3081 Allergic rhinitis due to animal (cat) (dog) hair and dander: Secondary | ICD-10-CM | POA: Diagnosis not present

## 2017-09-08 DIAGNOSIS — J3089 Other allergic rhinitis: Secondary | ICD-10-CM | POA: Diagnosis not present

## 2017-09-16 DIAGNOSIS — J3089 Other allergic rhinitis: Secondary | ICD-10-CM | POA: Diagnosis not present

## 2017-09-16 DIAGNOSIS — J301 Allergic rhinitis due to pollen: Secondary | ICD-10-CM | POA: Diagnosis not present

## 2017-09-16 DIAGNOSIS — J3081 Allergic rhinitis due to animal (cat) (dog) hair and dander: Secondary | ICD-10-CM | POA: Diagnosis not present

## 2017-09-18 DIAGNOSIS — J3081 Allergic rhinitis due to animal (cat) (dog) hair and dander: Secondary | ICD-10-CM | POA: Diagnosis not present

## 2017-09-18 DIAGNOSIS — J3089 Other allergic rhinitis: Secondary | ICD-10-CM | POA: Diagnosis not present

## 2017-09-18 DIAGNOSIS — J301 Allergic rhinitis due to pollen: Secondary | ICD-10-CM | POA: Diagnosis not present

## 2017-09-23 DIAGNOSIS — J3089 Other allergic rhinitis: Secondary | ICD-10-CM | POA: Diagnosis not present

## 2017-09-23 DIAGNOSIS — J3081 Allergic rhinitis due to animal (cat) (dog) hair and dander: Secondary | ICD-10-CM | POA: Diagnosis not present

## 2017-09-23 DIAGNOSIS — J301 Allergic rhinitis due to pollen: Secondary | ICD-10-CM | POA: Diagnosis not present

## 2017-09-30 DIAGNOSIS — J3089 Other allergic rhinitis: Secondary | ICD-10-CM | POA: Diagnosis not present

## 2017-09-30 DIAGNOSIS — J301 Allergic rhinitis due to pollen: Secondary | ICD-10-CM | POA: Diagnosis not present

## 2017-09-30 DIAGNOSIS — J3081 Allergic rhinitis due to animal (cat) (dog) hair and dander: Secondary | ICD-10-CM | POA: Diagnosis not present

## 2017-10-01 ENCOUNTER — Encounter: Payer: Self-pay | Admitting: Family Medicine

## 2017-10-08 DIAGNOSIS — J301 Allergic rhinitis due to pollen: Secondary | ICD-10-CM | POA: Diagnosis not present

## 2017-10-08 DIAGNOSIS — J3081 Allergic rhinitis due to animal (cat) (dog) hair and dander: Secondary | ICD-10-CM | POA: Diagnosis not present

## 2017-10-08 DIAGNOSIS — J3089 Other allergic rhinitis: Secondary | ICD-10-CM | POA: Diagnosis not present

## 2017-10-14 DIAGNOSIS — J3081 Allergic rhinitis due to animal (cat) (dog) hair and dander: Secondary | ICD-10-CM | POA: Diagnosis not present

## 2017-10-14 DIAGNOSIS — J3089 Other allergic rhinitis: Secondary | ICD-10-CM | POA: Diagnosis not present

## 2017-10-14 DIAGNOSIS — J301 Allergic rhinitis due to pollen: Secondary | ICD-10-CM | POA: Diagnosis not present

## 2017-10-16 DIAGNOSIS — J301 Allergic rhinitis due to pollen: Secondary | ICD-10-CM | POA: Diagnosis not present

## 2017-10-16 DIAGNOSIS — J3081 Allergic rhinitis due to animal (cat) (dog) hair and dander: Secondary | ICD-10-CM | POA: Diagnosis not present

## 2017-10-16 DIAGNOSIS — J3089 Other allergic rhinitis: Secondary | ICD-10-CM | POA: Diagnosis not present

## 2017-10-20 DIAGNOSIS — J301 Allergic rhinitis due to pollen: Secondary | ICD-10-CM | POA: Diagnosis not present

## 2017-10-20 DIAGNOSIS — J3089 Other allergic rhinitis: Secondary | ICD-10-CM | POA: Diagnosis not present

## 2017-10-20 DIAGNOSIS — J3081 Allergic rhinitis due to animal (cat) (dog) hair and dander: Secondary | ICD-10-CM | POA: Diagnosis not present

## 2017-10-26 DIAGNOSIS — J301 Allergic rhinitis due to pollen: Secondary | ICD-10-CM | POA: Diagnosis not present

## 2017-10-26 DIAGNOSIS — J3089 Other allergic rhinitis: Secondary | ICD-10-CM | POA: Diagnosis not present

## 2017-10-26 DIAGNOSIS — J3081 Allergic rhinitis due to animal (cat) (dog) hair and dander: Secondary | ICD-10-CM | POA: Diagnosis not present

## 2017-11-04 DIAGNOSIS — J3089 Other allergic rhinitis: Secondary | ICD-10-CM | POA: Diagnosis not present

## 2017-11-04 DIAGNOSIS — J301 Allergic rhinitis due to pollen: Secondary | ICD-10-CM | POA: Diagnosis not present

## 2017-11-04 DIAGNOSIS — J3081 Allergic rhinitis due to animal (cat) (dog) hair and dander: Secondary | ICD-10-CM | POA: Diagnosis not present

## 2017-11-12 DIAGNOSIS — J301 Allergic rhinitis due to pollen: Secondary | ICD-10-CM | POA: Diagnosis not present

## 2017-11-12 DIAGNOSIS — J3089 Other allergic rhinitis: Secondary | ICD-10-CM | POA: Diagnosis not present

## 2017-11-12 DIAGNOSIS — J3081 Allergic rhinitis due to animal (cat) (dog) hair and dander: Secondary | ICD-10-CM | POA: Diagnosis not present

## 2017-11-17 DIAGNOSIS — J3089 Other allergic rhinitis: Secondary | ICD-10-CM | POA: Diagnosis not present

## 2017-11-17 DIAGNOSIS — J3081 Allergic rhinitis due to animal (cat) (dog) hair and dander: Secondary | ICD-10-CM | POA: Diagnosis not present

## 2017-11-17 DIAGNOSIS — J301 Allergic rhinitis due to pollen: Secondary | ICD-10-CM | POA: Diagnosis not present

## 2017-11-17 DIAGNOSIS — Z23 Encounter for immunization: Secondary | ICD-10-CM | POA: Diagnosis not present

## 2017-11-25 DIAGNOSIS — J3089 Other allergic rhinitis: Secondary | ICD-10-CM | POA: Diagnosis not present

## 2017-11-25 DIAGNOSIS — J301 Allergic rhinitis due to pollen: Secondary | ICD-10-CM | POA: Diagnosis not present

## 2017-11-25 DIAGNOSIS — J3081 Allergic rhinitis due to animal (cat) (dog) hair and dander: Secondary | ICD-10-CM | POA: Diagnosis not present

## 2017-12-03 DIAGNOSIS — J3081 Allergic rhinitis due to animal (cat) (dog) hair and dander: Secondary | ICD-10-CM | POA: Diagnosis not present

## 2017-12-03 DIAGNOSIS — J3089 Other allergic rhinitis: Secondary | ICD-10-CM | POA: Diagnosis not present

## 2017-12-03 DIAGNOSIS — J301 Allergic rhinitis due to pollen: Secondary | ICD-10-CM | POA: Diagnosis not present

## 2017-12-09 DIAGNOSIS — J3089 Other allergic rhinitis: Secondary | ICD-10-CM | POA: Diagnosis not present

## 2017-12-09 DIAGNOSIS — J3081 Allergic rhinitis due to animal (cat) (dog) hair and dander: Secondary | ICD-10-CM | POA: Diagnosis not present

## 2017-12-09 DIAGNOSIS — J301 Allergic rhinitis due to pollen: Secondary | ICD-10-CM | POA: Diagnosis not present

## 2017-12-11 ENCOUNTER — Encounter: Payer: Self-pay | Admitting: Family Medicine

## 2017-12-11 ENCOUNTER — Ambulatory Visit (INDEPENDENT_AMBULATORY_CARE_PROVIDER_SITE_OTHER): Payer: BLUE CROSS/BLUE SHIELD | Admitting: Family Medicine

## 2017-12-11 VITALS — BP 124/80 | HR 68 | Temp 98.1°F | Ht 64.75 in | Wt 206.6 lb

## 2017-12-11 DIAGNOSIS — Z0000 Encounter for general adult medical examination without abnormal findings: Secondary | ICD-10-CM | POA: Diagnosis not present

## 2017-12-11 DIAGNOSIS — Z1331 Encounter for screening for depression: Secondary | ICD-10-CM

## 2017-12-11 DIAGNOSIS — J301 Allergic rhinitis due to pollen: Secondary | ICD-10-CM | POA: Diagnosis not present

## 2017-12-11 LAB — BASIC METABOLIC PANEL
BUN: 15 mg/dL (ref 6–23)
CALCIUM: 9.3 mg/dL (ref 8.4–10.5)
CO2: 30 meq/L (ref 19–32)
Chloride: 106 mEq/L (ref 96–112)
Creatinine, Ser: 0.87 mg/dL (ref 0.40–1.20)
GFR: 71.81 mL/min (ref >60.00)
GLUCOSE: 108 mg/dL — AB (ref 70–99)
Potassium: 4.3 mEq/L (ref 3.5–5.1)
SODIUM: 143 meq/L (ref 135–145)

## 2017-12-11 LAB — POC URINALSYSI DIPSTICK (AUTOMATED)
BILIRUBIN UA: NEGATIVE
Blood, UA: NEGATIVE
GLUCOSE UA: NEGATIVE
KETONES UA: NEGATIVE
Leukocytes, UA: NEGATIVE
Nitrite, UA: NEGATIVE
PH UA: 6 (ref 5.0–8.0)
PROTEIN UA: NEGATIVE
SPEC GRAV UA: 1.025 (ref 1.010–1.025)
UROBILINOGEN UA: 0.2 U/dL

## 2017-12-11 LAB — CBC WITH DIFFERENTIAL/PLATELET
BASOS ABS: 0.1 10*3/uL (ref 0.0–0.1)
Basophils Relative: 1 % (ref 0.0–3.0)
Eosinophils Absolute: 0.2 10*3/uL (ref 0.0–0.7)
Eosinophils Relative: 3.1 % (ref 0.0–5.0)
HCT: 40.2 % (ref 36.0–46.0)
Hemoglobin: 13 g/dL (ref 12.0–15.0)
LYMPHS ABS: 1.5 10*3/uL (ref 0.7–4.0)
Lymphocytes Relative: 25.5 % (ref 12.0–46.0)
MCHC: 32.3 g/dL (ref 30.0–36.0)
MCV: 82.9 fl (ref 78.0–100.0)
MONO ABS: 0.5 10*3/uL (ref 0.1–1.0)
Monocytes Relative: 8.6 % (ref 3.0–12.0)
NEUTROS ABS: 3.6 10*3/uL (ref 1.4–7.7)
NEUTROS PCT: 61.8 % (ref 43.0–77.0)
Platelets: 256 10*3/uL (ref 150.0–400.0)
RBC: 4.84 Mil/uL (ref 3.87–5.11)
RDW: 15.1 % (ref 11.5–15.5)
WBC: 5.8 10*3/uL (ref 4.0–10.5)

## 2017-12-11 LAB — HEPATIC FUNCTION PANEL
ALK PHOS: 61 U/L (ref 39–117)
ALT: 16 U/L (ref 0–35)
AST: 16 U/L (ref 0–37)
Albumin: 3.9 g/dL (ref 3.5–5.2)
Bilirubin, Direct: 0.1 mg/dL (ref 0.0–0.3)
TOTAL PROTEIN: 6.4 g/dL (ref 6.0–8.3)
Total Bilirubin: 0.8 mg/dL (ref 0.2–1.2)

## 2017-12-11 LAB — LIPID PANEL
CHOLESTEROL: 181 mg/dL (ref 0–200)
HDL: 53.3 mg/dL (ref >39.00)
LDL Cholesterol: 106 mg/dL — ABNORMAL HIGH (ref 0–99)
NonHDL: 127.45
Total CHOL/HDL Ratio: 3
Triglycerides: 107 mg/dL (ref 0.0–149.0)
VLDL: 21.4 mg/dL (ref 0.0–40.0)

## 2017-12-11 LAB — TSH: TSH: 1.62 u[IU]/mL (ref 0.35–4.50)

## 2017-12-11 MED ORDER — FLUTICASONE PROPIONATE 50 MCG/ACT NA SUSP: NASAL | 3 refills | Status: DC

## 2017-12-11 MED ORDER — METOPROLOL SUCCINATE ER 25 MG PO TB24: ORAL_TABLET | ORAL | 3 refills | Status: DC

## 2017-12-11 MED ORDER — MONTELUKAST SODIUM 10 MG PO TABS: 10.0000 mg | ORAL_TABLET | Freq: Every day | ORAL | 3 refills | Status: DC

## 2017-12-11 MED ORDER — ESOMEPRAZOLE MAGNESIUM 40 MG PO CPDR: 40.0000 mg | DELAYED_RELEASE_CAPSULE | Freq: Every day | ORAL | 3 refills | Status: DC

## 2017-12-11 MED ORDER — POTASSIUM CHLORIDE CRYS ER 10 MEQ PO TBCR: EXTENDED_RELEASE_TABLET | ORAL | 3 refills | Status: DC

## 2017-12-11 NOTE — Progress Notes (Signed)
   Subjective:    Patient ID: Olivia Carr, female    DOB: [birth year], 57 y.o.   MRN: [id number]  HPI Here for a well exam. She feels good. Her asthma has been stable.    Review of Systems  Constitutional: Negative.   HENT: Negative.   Eyes: Negative.   Respiratory: Negative.   Cardiovascular: Negative.   Gastrointestinal: Negative.   Genitourinary: Negative for decreased urine volume, difficulty urinating, dyspareunia, dysuria, enuresis, flank pain, frequency, hematuria, pelvic pain and urgency.  Musculoskeletal: Negative.   Skin: Negative.   Neurological: Negative.   Psychiatric/Behavioral: Negative.        Objective:   Physical Exam  Constitutional: She is oriented to person, place, and time. She appears well-developed and well-nourished. No distress.  HENT:  Head: Normocephalic and atraumatic.  Right Ear: External ear normal.  Left Ear: External ear normal.  Nose: Nose normal.  Mouth/Throat: Oropharynx is clear and moist. No oropharyngeal exudate.  Eyes: Pupils are equal, round, and reactive to light. Conjunctivae and EOM are normal. No scleral icterus.  Neck: Normal range of motion. Neck supple. No JVD present. No thyromegaly present.  Cardiovascular: Normal rate, regular rhythm, normal heart sounds and intact distal pulses. Exam reveals no gallop and no friction rub.  No murmur heard. Pulmonary/Chest: Effort normal and breath sounds normal. No respiratory distress. She has no wheezes. She has no rales. She exhibits no tenderness.  Abdominal: Soft. Bowel sounds are normal. She exhibits no distension and no mass. There is no tenderness. There is no rebound and no guarding.  Musculoskeletal: Normal range of motion. She exhibits no edema or tenderness.  Lymphadenopathy:    She has no cervical adenopathy.  Neurological: She is alert and oriented to person, place, and time. She has normal reflexes. She displays normal reflexes. No cranial nerve deficit. She exhibits  normal muscle tone. Coordination normal.  Skin: Skin is warm and dry. No rash noted. No erythema.  Psychiatric: She has a normal mood and affect. Her behavior is normal. Judgment and thought content normal.          Assessment & Plan:  Well exam. We discussed diet and exercise. Get fasting labs. She will contact Dr. Osborn Coho office since she is due for a colonoscopy next month. Alysia Penna, MD

## 2017-12-14 DIAGNOSIS — J3089 Other allergic rhinitis: Secondary | ICD-10-CM | POA: Diagnosis not present

## 2017-12-14 DIAGNOSIS — J3081 Allergic rhinitis due to animal (cat) (dog) hair and dander: Secondary | ICD-10-CM | POA: Diagnosis not present

## 2017-12-18 DIAGNOSIS — J3089 Other allergic rhinitis: Secondary | ICD-10-CM | POA: Diagnosis not present

## 2017-12-18 DIAGNOSIS — J3081 Allergic rhinitis due to animal (cat) (dog) hair and dander: Secondary | ICD-10-CM | POA: Diagnosis not present

## 2017-12-18 DIAGNOSIS — J301 Allergic rhinitis due to pollen: Secondary | ICD-10-CM | POA: Diagnosis not present

## 2017-12-21 DIAGNOSIS — J3089 Other allergic rhinitis: Secondary | ICD-10-CM | POA: Diagnosis not present

## 2017-12-21 DIAGNOSIS — J301 Allergic rhinitis due to pollen: Secondary | ICD-10-CM | POA: Diagnosis not present

## 2017-12-21 DIAGNOSIS — J3081 Allergic rhinitis due to animal (cat) (dog) hair and dander: Secondary | ICD-10-CM | POA: Diagnosis not present

## 2017-12-23 DIAGNOSIS — J3081 Allergic rhinitis due to animal (cat) (dog) hair and dander: Secondary | ICD-10-CM | POA: Diagnosis not present

## 2017-12-23 DIAGNOSIS — J3089 Other allergic rhinitis: Secondary | ICD-10-CM | POA: Diagnosis not present

## 2017-12-23 DIAGNOSIS — J301 Allergic rhinitis due to pollen: Secondary | ICD-10-CM | POA: Diagnosis not present

## 2017-12-27 HISTORY — PX: COLONOSCOPY: SHX174

## 2017-12-28 DIAGNOSIS — J3089 Other allergic rhinitis: Secondary | ICD-10-CM | POA: Diagnosis not present

## 2017-12-28 DIAGNOSIS — J3081 Allergic rhinitis due to animal (cat) (dog) hair and dander: Secondary | ICD-10-CM | POA: Diagnosis not present

## 2017-12-28 DIAGNOSIS — J301 Allergic rhinitis due to pollen: Secondary | ICD-10-CM | POA: Diagnosis not present

## 2017-12-30 DIAGNOSIS — J3089 Other allergic rhinitis: Secondary | ICD-10-CM | POA: Diagnosis not present

## 2017-12-30 DIAGNOSIS — J3081 Allergic rhinitis due to animal (cat) (dog) hair and dander: Secondary | ICD-10-CM | POA: Diagnosis not present

## 2017-12-30 DIAGNOSIS — J301 Allergic rhinitis due to pollen: Secondary | ICD-10-CM | POA: Diagnosis not present

## 2018-01-06 DIAGNOSIS — J3081 Allergic rhinitis due to animal (cat) (dog) hair and dander: Secondary | ICD-10-CM | POA: Diagnosis not present

## 2018-01-06 DIAGNOSIS — J3089 Other allergic rhinitis: Secondary | ICD-10-CM | POA: Diagnosis not present

## 2018-01-06 DIAGNOSIS — J301 Allergic rhinitis due to pollen: Secondary | ICD-10-CM | POA: Diagnosis not present

## 2018-01-08 DIAGNOSIS — K5730 Diverticulosis of large intestine without perforation or abscess without bleeding: Secondary | ICD-10-CM | POA: Diagnosis not present

## 2018-01-08 DIAGNOSIS — Z86010 Personal history of colonic polyps: Secondary | ICD-10-CM | POA: Diagnosis not present

## 2018-01-08 DIAGNOSIS — D125 Benign neoplasm of sigmoid colon: Secondary | ICD-10-CM | POA: Diagnosis not present

## 2018-01-08 DIAGNOSIS — D124 Benign neoplasm of descending colon: Secondary | ICD-10-CM | POA: Diagnosis not present

## 2018-01-12 DIAGNOSIS — D125 Benign neoplasm of sigmoid colon: Secondary | ICD-10-CM | POA: Diagnosis not present

## 2018-01-12 DIAGNOSIS — D124 Benign neoplasm of descending colon: Secondary | ICD-10-CM | POA: Diagnosis not present

## 2018-01-13 DIAGNOSIS — J301 Allergic rhinitis due to pollen: Secondary | ICD-10-CM | POA: Diagnosis not present

## 2018-01-13 DIAGNOSIS — J3089 Other allergic rhinitis: Secondary | ICD-10-CM | POA: Diagnosis not present

## 2018-01-13 DIAGNOSIS — J3081 Allergic rhinitis due to animal (cat) (dog) hair and dander: Secondary | ICD-10-CM | POA: Diagnosis not present

## 2018-01-19 DIAGNOSIS — J3089 Other allergic rhinitis: Secondary | ICD-10-CM | POA: Diagnosis not present

## 2018-01-19 DIAGNOSIS — J301 Allergic rhinitis due to pollen: Secondary | ICD-10-CM | POA: Diagnosis not present

## 2018-01-19 DIAGNOSIS — J3081 Allergic rhinitis due to animal (cat) (dog) hair and dander: Secondary | ICD-10-CM | POA: Diagnosis not present

## 2018-01-28 DIAGNOSIS — J3081 Allergic rhinitis due to animal (cat) (dog) hair and dander: Secondary | ICD-10-CM | POA: Diagnosis not present

## 2018-01-28 DIAGNOSIS — J3089 Other allergic rhinitis: Secondary | ICD-10-CM | POA: Diagnosis not present

## 2018-01-28 DIAGNOSIS — J301 Allergic rhinitis due to pollen: Secondary | ICD-10-CM | POA: Diagnosis not present

## 2018-02-02 ENCOUNTER — Other Ambulatory Visit: Payer: Self-pay | Admitting: Family Medicine

## 2018-02-02 DIAGNOSIS — Z1231 Encounter for screening mammogram for malignant neoplasm of breast: Secondary | ICD-10-CM

## 2018-02-05 DIAGNOSIS — J3089 Other allergic rhinitis: Secondary | ICD-10-CM | POA: Diagnosis not present

## 2018-02-05 DIAGNOSIS — J301 Allergic rhinitis due to pollen: Secondary | ICD-10-CM | POA: Diagnosis not present

## 2018-02-10 DIAGNOSIS — J301 Allergic rhinitis due to pollen: Secondary | ICD-10-CM | POA: Diagnosis not present

## 2018-02-10 DIAGNOSIS — J3081 Allergic rhinitis due to animal (cat) (dog) hair and dander: Secondary | ICD-10-CM | POA: Diagnosis not present

## 2018-02-10 DIAGNOSIS — J3089 Other allergic rhinitis: Secondary | ICD-10-CM | POA: Diagnosis not present

## 2018-02-17 DIAGNOSIS — J301 Allergic rhinitis due to pollen: Secondary | ICD-10-CM | POA: Diagnosis not present

## 2018-02-17 DIAGNOSIS — J3089 Other allergic rhinitis: Secondary | ICD-10-CM | POA: Diagnosis not present

## 2018-02-17 DIAGNOSIS — J3081 Allergic rhinitis due to animal (cat) (dog) hair and dander: Secondary | ICD-10-CM | POA: Diagnosis not present

## 2018-02-24 DIAGNOSIS — J301 Allergic rhinitis due to pollen: Secondary | ICD-10-CM | POA: Diagnosis not present

## 2018-02-24 DIAGNOSIS — J3081 Allergic rhinitis due to animal (cat) (dog) hair and dander: Secondary | ICD-10-CM | POA: Diagnosis not present

## 2018-02-24 DIAGNOSIS — J3089 Other allergic rhinitis: Secondary | ICD-10-CM | POA: Diagnosis not present

## 2018-03-01 DIAGNOSIS — H16143 Punctate keratitis, bilateral: Secondary | ICD-10-CM | POA: Diagnosis not present

## 2018-03-03 DIAGNOSIS — J3089 Other allergic rhinitis: Secondary | ICD-10-CM | POA: Diagnosis not present

## 2018-03-03 DIAGNOSIS — J301 Allergic rhinitis due to pollen: Secondary | ICD-10-CM | POA: Diagnosis not present

## 2018-03-03 DIAGNOSIS — J3081 Allergic rhinitis due to animal (cat) (dog) hair and dander: Secondary | ICD-10-CM | POA: Diagnosis not present

## 2018-03-10 ENCOUNTER — Encounter: Payer: Self-pay | Admitting: Obstetrics & Gynecology

## 2018-03-10 ENCOUNTER — Ambulatory Visit (INDEPENDENT_AMBULATORY_CARE_PROVIDER_SITE_OTHER): Payer: BLUE CROSS/BLUE SHIELD | Admitting: Obstetrics & Gynecology

## 2018-03-10 VITALS — BP 132/80 | Ht 64.25 in | Wt 209.0 lb

## 2018-03-10 DIAGNOSIS — Z6835 Body mass index (BMI) 35.0-35.9, adult: Secondary | ICD-10-CM

## 2018-03-10 DIAGNOSIS — E6609 Other obesity due to excess calories: Secondary | ICD-10-CM | POA: Diagnosis not present

## 2018-03-10 DIAGNOSIS — Z01419 Encounter for gynecological examination (general) (routine) without abnormal findings: Secondary | ICD-10-CM

## 2018-03-10 DIAGNOSIS — Z90710 Acquired absence of both cervix and uterus: Secondary | ICD-10-CM

## 2018-03-10 DIAGNOSIS — Z780 Asymptomatic menopausal state: Secondary | ICD-10-CM

## 2018-03-10 NOTE — Progress Notes (Signed)
Olivia Carr 10/11/1960 [id number]   History:    58 y.o. G3P2A1L2 Married.  Daughters 81 and 58 yo.  RP:  Established patient presenting for annual gyn exam   HPI: S/P LAVH.  Menopause, well on no hormone replacement therapy.  No pelvic pain.  No pain with intercourse.  Breasts normal.  Body mass index 35.6.  Joint a weight loss program at work.  Health labs with Dr. Sharlene Motts.  Colonoscopy December 2019 showed benign polyps.  Past medical history,surgical history, family history and social history were all reviewed and documented in the EPIC chart.  Gynecologic History No LMP recorded. Patient has had a hysterectomy. Contraception: status post hysterectomy Last Pap: 02/2017. Results were: Negative Last mammogram: 02/2017. Results were: Left negative.  Right Dx mammo/US benign Bone Density: 03/2015 Normal.  Repeat at 7 yrs. Colonoscopy: 12/2017 benign polyps.  3 yr schedule  Obstetric History OB History  Gravida Para Term Preterm AB Living  3 2 2   1 2   SAB TAB Ectopic Multiple Live Births  1       2    # Outcome Date GA Lbr Len/2nd Weight Sex Delivery Anes PTL Lv  3 SAB           2 Term     F Vag-Spont  N LIV  1 Term     F Vag-Spont  N LIV     ROS: A ROS was performed and pertinent positives and negatives are included in the history.  GENERAL: No fevers or chills. HEENT: No change in vision, no earache, sore throat or sinus congestion. NECK: No pain or stiffness. CARDIOVASCULAR: No chest pain or pressure. No palpitations. PULMONARY: No shortness of breath, cough or wheeze. GASTROINTESTINAL: No abdominal pain, nausea, vomiting or diarrhea, melena or bright red blood per rectum. GENITOURINARY: No urinary frequency, urgency, hesitancy or dysuria. MUSCULOSKELETAL: No joint or muscle pain, no back pain, no recent trauma. DERMATOLOGIC: No rash, no itching, no lesions. ENDOCRINE: No polyuria, polydipsia, no heat or cold intolerance. No recent change in weight. HEMATOLOGICAL: No anemia  or easy bruising or bleeding. NEUROLOGIC: No headache, seizures, numbness, tingling or weakness. PSYCHIATRIC: No depression, no loss of interest in normal activity or change in sleep pattern.     Exam:   BP 132/80   Ht 5' 4.25" (1.632 m)   Wt 209 lb (94.8 kg)   BMI 35.60 kg/m   Body mass index is 35.6 kg/m.  General appearance : Well developed well nourished female. No acute distress HEENT: Eyes: no retinal hemorrhage or exudates,  Neck supple, trachea midline, no carotid bruits, no thyroidmegaly Lungs: Clear to auscultation, no rhonchi or wheezes, or rib retractions  Heart: Regular rate and rhythm, no murmurs or gallops Breast:Examined in sitting and supine position were symmetrical in appearance, no palpable masses or tenderness,  no skin retraction, no nipple inversion, no nipple discharge, no skin discoloration, no axillary or supraclavicular lymphadenopathy Abdomen: no palpable masses or tenderness, no rebound or guarding Extremities: no edema or skin discoloration or tenderness  Pelvic: Vulva: Normal             Vagina: No gross lesions or discharge  Cervix/Uterus absent  Adnexa  Without masses or tenderness  Anus: Normal   Assessment/Plan:  58 y.o. female for annual exam   1. Well female exam with routine gynecological exam Gynecology exam status post LAVH and menopause.  Pap test February 2019 was negative, no indication to repeat this year.  Breast  exam normal.  Screening mammogram scheduled February 2020.  Colonoscopy December 2019 showed benign polyps, on the 3-year schedule.  Health labs with Dr. Sharlene Motts.  2. S/P laparoscopic assisted vaginal hysterectomy (LAVH)  3. Postmenopausal Well on no hormone replacement therapy.  Bone density March 2017 was normal.  Vitamin D supplements, calcium intake of 1.5 g/day including nutritional and supplemental, weightbearing physical activities.  We will repeat a bone density in March 2022.  4. Class 2 obesity due to excess calories  without serious comorbidity with body mass index (BMI) of 35.0 to 35.9 in adult Continue with weight loss program at work.  Recommend a low calorie/carb diet such as [employer].  Aerobic physical activities recommended 5 times a week and weightlifting every 2 days.  Princess Bruins MD, 8:49 AM 03/10/2018

## 2018-03-10 NOTE — Patient Instructions (Signed)
1. Well female exam with routine gynecological exam Gynecology exam status post LAVH and menopause.  Pap test February 2019 was negative, no indication to repeat this year.  Breast exam normal.  Screening mammogram scheduled February 2020.  Colonoscopy December 2019 showed benign polyps, on the 3-year schedule.  Health labs with Dr. Sharlene Motts.  2. S/P laparoscopic assisted vaginal hysterectomy (LAVH)  3. Postmenopausal Well on no hormone replacement therapy.  Bone density March 2017 was normal.  Vitamin D supplements, calcium intake of 1.5 g/day including nutritional and supplemental, weightbearing physical activities.  We will repeat a bone density in March 2022.  4. Class 2 obesity due to excess calories without serious comorbidity with body mass index (BMI) of 35.0 to 35.9 in adult Continue with weight loss program at work.  Recommend a low calorie/carb diet such as [employer].  Aerobic physical activities recommended 5 times a week and weightlifting every 2 days.  Olivia Carr, it was a pleasure seeing you today!

## 2018-03-12 DIAGNOSIS — J3089 Other allergic rhinitis: Secondary | ICD-10-CM | POA: Diagnosis not present

## 2018-03-12 DIAGNOSIS — J301 Allergic rhinitis due to pollen: Secondary | ICD-10-CM | POA: Diagnosis not present

## 2018-03-12 DIAGNOSIS — J3081 Allergic rhinitis due to animal (cat) (dog) hair and dander: Secondary | ICD-10-CM | POA: Diagnosis not present

## 2018-03-16 ENCOUNTER — Ambulatory Visit
Admission: RE | Admit: 2018-03-16 | Discharge: 2018-03-16 | Disposition: A | Discharge status: Home / Self Care | Payer: BLUE CROSS/BLUE SHIELD | Source: Ambulatory Visit | Attending: Family Medicine | Admitting: Family Medicine

## 2018-03-16 DIAGNOSIS — Z1231 Encounter for screening mammogram for malignant neoplasm of breast: Secondary | ICD-10-CM

## 2018-03-17 DIAGNOSIS — J3089 Other allergic rhinitis: Secondary | ICD-10-CM | POA: Diagnosis not present

## 2018-03-17 DIAGNOSIS — J301 Allergic rhinitis due to pollen: Secondary | ICD-10-CM | POA: Diagnosis not present

## 2018-03-17 DIAGNOSIS — J3081 Allergic rhinitis due to animal (cat) (dog) hair and dander: Secondary | ICD-10-CM | POA: Diagnosis not present

## 2018-03-24 DIAGNOSIS — J301 Allergic rhinitis due to pollen: Secondary | ICD-10-CM | POA: Diagnosis not present

## 2018-03-24 DIAGNOSIS — J3089 Other allergic rhinitis: Secondary | ICD-10-CM | POA: Diagnosis not present

## 2018-03-24 DIAGNOSIS — J3081 Allergic rhinitis due to animal (cat) (dog) hair and dander: Secondary | ICD-10-CM | POA: Diagnosis not present

## 2018-04-01 DIAGNOSIS — J3081 Allergic rhinitis due to animal (cat) (dog) hair and dander: Secondary | ICD-10-CM | POA: Diagnosis not present

## 2018-04-01 DIAGNOSIS — J3089 Other allergic rhinitis: Secondary | ICD-10-CM | POA: Diagnosis not present

## 2018-04-01 DIAGNOSIS — J301 Allergic rhinitis due to pollen: Secondary | ICD-10-CM | POA: Diagnosis not present

## 2018-04-07 DIAGNOSIS — J3081 Allergic rhinitis due to animal (cat) (dog) hair and dander: Secondary | ICD-10-CM | POA: Diagnosis not present

## 2018-04-07 DIAGNOSIS — J301 Allergic rhinitis due to pollen: Secondary | ICD-10-CM | POA: Diagnosis not present

## 2018-04-07 DIAGNOSIS — J3089 Other allergic rhinitis: Secondary | ICD-10-CM | POA: Diagnosis not present

## 2018-04-13 ENCOUNTER — Ambulatory Visit: Payer: BLUE CROSS/BLUE SHIELD | Admitting: Family Medicine

## 2018-04-13 ENCOUNTER — Encounter: Payer: Self-pay | Admitting: Family Medicine

## 2018-04-13 ENCOUNTER — Other Ambulatory Visit: Payer: Self-pay

## 2018-04-13 VITALS — BP 124/72 | HR 63 | Temp 98.4°F | Wt 209.1 lb

## 2018-04-13 DIAGNOSIS — N390 Urinary tract infection, site not specified: Secondary | ICD-10-CM | POA: Diagnosis not present

## 2018-04-13 DIAGNOSIS — R319 Hematuria, unspecified: Secondary | ICD-10-CM

## 2018-04-13 LAB — POCT URINALYSIS DIPSTICK
BILIRUBIN UA: NEGATIVE
Glucose, UA: NEGATIVE
Ketones, UA: NEGATIVE
Nitrite, UA: NEGATIVE
PH UA: 6 (ref 5.0–8.0)
PROTEIN UA: NEGATIVE
Spec Grav, UA: 1.015 (ref 1.010–1.025)
UROBILINOGEN UA: NEGATIVE U/dL — AB

## 2018-04-13 MED ORDER — CIPROFLOXACIN HCL 500 MG PO TABS: 500.0000 mg | ORAL_TABLET | Freq: Two times a day (BID) | ORAL | 0 refills | Status: DC

## 2018-04-13 NOTE — Addendum Note (Signed)
Addended by: Elmer Picker on: 04/13/2018 11:47 AM   Modules accepted: Orders

## 2018-04-13 NOTE — Progress Notes (Signed)
   Subjective:    Patient ID: Olivia Carr, female    DOB: 18-Sep-1960, 58 y.o.   MRN: [id number]  HPI Here for 3 days of urinary urgency and burning. No fever. Drinking lots of water.    Review of Systems  Constitutional: Negative.   Respiratory: Negative.   Cardiovascular: Negative.   Genitourinary: Positive for dysuria, frequency and urgency. Negative for flank pain and hematuria.       Objective:   Physical Exam Constitutional:      Appearance: Normal appearance.  Cardiovascular:     Rate and Rhythm: Normal rate and regular rhythm.     Pulses: Normal pulses.     Heart sounds: Normal heart sounds.  Pulmonary:     Effort: Pulmonary effort is normal.     Breath sounds: Normal breath sounds.  Abdominal:     General: Abdomen is flat. Bowel sounds are normal. There is no distension.     Palpations: Abdomen is soft. There is no mass.     Tenderness: There is no abdominal tenderness. There is no guarding or rebound.     Hernia: No hernia is present.  Neurological:     Mental Status: She is alert.           Assessment & Plan:  UTI, treat with Cipro. Culture the sample.  Alysia Penna, MD

## 2018-04-14 DIAGNOSIS — J301 Allergic rhinitis due to pollen: Secondary | ICD-10-CM | POA: Diagnosis not present

## 2018-04-14 DIAGNOSIS — J3089 Other allergic rhinitis: Secondary | ICD-10-CM | POA: Diagnosis not present

## 2018-04-14 DIAGNOSIS — J3081 Allergic rhinitis due to animal (cat) (dog) hair and dander: Secondary | ICD-10-CM | POA: Diagnosis not present

## 2018-04-15 LAB — URINE CULTURE
MICRO NUMBER:: 328478
SPECIMEN QUALITY:: ADEQUATE

## 2018-04-21 DIAGNOSIS — J3089 Other allergic rhinitis: Secondary | ICD-10-CM | POA: Diagnosis not present

## 2018-04-21 DIAGNOSIS — J301 Allergic rhinitis due to pollen: Secondary | ICD-10-CM | POA: Diagnosis not present

## 2018-04-21 DIAGNOSIS — J3081 Allergic rhinitis due to animal (cat) (dog) hair and dander: Secondary | ICD-10-CM | POA: Diagnosis not present

## 2018-04-23 DIAGNOSIS — J301 Allergic rhinitis due to pollen: Secondary | ICD-10-CM | POA: Diagnosis not present

## 2018-04-26 DIAGNOSIS — J3081 Allergic rhinitis due to animal (cat) (dog) hair and dander: Secondary | ICD-10-CM | POA: Diagnosis not present

## 2018-04-26 DIAGNOSIS — J3089 Other allergic rhinitis: Secondary | ICD-10-CM | POA: Diagnosis not present

## 2018-04-28 DIAGNOSIS — J3089 Other allergic rhinitis: Secondary | ICD-10-CM | POA: Diagnosis not present

## 2018-04-28 DIAGNOSIS — J3081 Allergic rhinitis due to animal (cat) (dog) hair and dander: Secondary | ICD-10-CM | POA: Diagnosis not present

## 2018-04-28 DIAGNOSIS — J301 Allergic rhinitis due to pollen: Secondary | ICD-10-CM | POA: Diagnosis not present

## 2018-05-05 DIAGNOSIS — J3081 Allergic rhinitis due to animal (cat) (dog) hair and dander: Secondary | ICD-10-CM | POA: Diagnosis not present

## 2018-05-05 DIAGNOSIS — J301 Allergic rhinitis due to pollen: Secondary | ICD-10-CM | POA: Diagnosis not present

## 2018-05-05 DIAGNOSIS — J3089 Other allergic rhinitis: Secondary | ICD-10-CM | POA: Diagnosis not present

## 2018-05-12 DIAGNOSIS — J3081 Allergic rhinitis due to animal (cat) (dog) hair and dander: Secondary | ICD-10-CM | POA: Diagnosis not present

## 2018-05-12 DIAGNOSIS — J301 Allergic rhinitis due to pollen: Secondary | ICD-10-CM | POA: Diagnosis not present

## 2018-05-26 DIAGNOSIS — J3081 Allergic rhinitis due to animal (cat) (dog) hair and dander: Secondary | ICD-10-CM | POA: Diagnosis not present

## 2018-05-26 DIAGNOSIS — J3089 Other allergic rhinitis: Secondary | ICD-10-CM | POA: Diagnosis not present

## 2018-05-26 DIAGNOSIS — J301 Allergic rhinitis due to pollen: Secondary | ICD-10-CM | POA: Diagnosis not present

## 2018-05-28 DIAGNOSIS — J3081 Allergic rhinitis due to animal (cat) (dog) hair and dander: Secondary | ICD-10-CM | POA: Diagnosis not present

## 2018-05-28 DIAGNOSIS — J301 Allergic rhinitis due to pollen: Secondary | ICD-10-CM | POA: Diagnosis not present

## 2018-05-28 DIAGNOSIS — J3089 Other allergic rhinitis: Secondary | ICD-10-CM | POA: Diagnosis not present

## 2018-06-02 DIAGNOSIS — J3089 Other allergic rhinitis: Secondary | ICD-10-CM | POA: Diagnosis not present

## 2018-06-02 DIAGNOSIS — J301 Allergic rhinitis due to pollen: Secondary | ICD-10-CM | POA: Diagnosis not present

## 2018-06-02 DIAGNOSIS — J3081 Allergic rhinitis due to animal (cat) (dog) hair and dander: Secondary | ICD-10-CM | POA: Diagnosis not present

## 2018-06-04 DIAGNOSIS — J301 Allergic rhinitis due to pollen: Secondary | ICD-10-CM | POA: Diagnosis not present

## 2018-06-04 DIAGNOSIS — J3081 Allergic rhinitis due to animal (cat) (dog) hair and dander: Secondary | ICD-10-CM | POA: Diagnosis not present

## 2018-06-04 DIAGNOSIS — J3089 Other allergic rhinitis: Secondary | ICD-10-CM | POA: Diagnosis not present

## 2018-06-09 DIAGNOSIS — J301 Allergic rhinitis due to pollen: Secondary | ICD-10-CM | POA: Diagnosis not present

## 2018-06-09 DIAGNOSIS — J3081 Allergic rhinitis due to animal (cat) (dog) hair and dander: Secondary | ICD-10-CM | POA: Diagnosis not present

## 2018-06-09 DIAGNOSIS — J3089 Other allergic rhinitis: Secondary | ICD-10-CM | POA: Diagnosis not present

## 2018-06-15 DIAGNOSIS — J3089 Other allergic rhinitis: Secondary | ICD-10-CM | POA: Diagnosis not present

## 2018-06-15 DIAGNOSIS — J3081 Allergic rhinitis due to animal (cat) (dog) hair and dander: Secondary | ICD-10-CM | POA: Diagnosis not present

## 2018-06-15 DIAGNOSIS — J301 Allergic rhinitis due to pollen: Secondary | ICD-10-CM | POA: Diagnosis not present

## 2018-06-22 DIAGNOSIS — J3081 Allergic rhinitis due to animal (cat) (dog) hair and dander: Secondary | ICD-10-CM | POA: Diagnosis not present

## 2018-06-22 DIAGNOSIS — J3089 Other allergic rhinitis: Secondary | ICD-10-CM | POA: Diagnosis not present

## 2018-06-22 DIAGNOSIS — J301 Allergic rhinitis due to pollen: Secondary | ICD-10-CM | POA: Diagnosis not present

## 2018-06-30 ENCOUNTER — Telehealth: Payer: Self-pay | Admitting: *Deleted

## 2018-06-30 NOTE — Telephone Encounter (Signed)
Copied from [place] [id number]. Topic: General - Other >> Jun 30, 2018  9:10 AM Marin Olp L wrote: Reason for CRM: Patient would like a call back from Dr. Sharlene Motts or nurse to discuss results from head CT. 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

## 2018-06-30 NOTE — Telephone Encounter (Signed)
Dr. Sarajane Jews please advise.   I have looked through her chart and do not see any CT scans that have been done.

## 2018-06-30 NOTE — Telephone Encounter (Signed)
I printed out her last brain MRI and it is at your desk

## 2018-07-01 ENCOUNTER — Encounter: Payer: Self-pay | Admitting: *Deleted

## 2018-07-02 DIAGNOSIS — J301 Allergic rhinitis due to pollen: Secondary | ICD-10-CM | POA: Diagnosis not present

## 2018-07-02 DIAGNOSIS — J3081 Allergic rhinitis due to animal (cat) (dog) hair and dander: Secondary | ICD-10-CM | POA: Diagnosis not present

## 2018-07-02 DIAGNOSIS — J3089 Other allergic rhinitis: Secondary | ICD-10-CM | POA: Diagnosis not present

## 2018-07-07 DIAGNOSIS — J3081 Allergic rhinitis due to animal (cat) (dog) hair and dander: Secondary | ICD-10-CM | POA: Diagnosis not present

## 2018-07-07 DIAGNOSIS — J301 Allergic rhinitis due to pollen: Secondary | ICD-10-CM | POA: Diagnosis not present

## 2018-07-07 DIAGNOSIS — J3089 Other allergic rhinitis: Secondary | ICD-10-CM | POA: Diagnosis not present

## 2018-07-12 DIAGNOSIS — J3089 Other allergic rhinitis: Secondary | ICD-10-CM | POA: Diagnosis not present

## 2018-07-12 DIAGNOSIS — J3081 Allergic rhinitis due to animal (cat) (dog) hair and dander: Secondary | ICD-10-CM | POA: Diagnosis not present

## 2018-07-12 DIAGNOSIS — J301 Allergic rhinitis due to pollen: Secondary | ICD-10-CM | POA: Diagnosis not present

## 2018-07-12 DIAGNOSIS — J4520 Mild intermittent asthma, uncomplicated: Secondary | ICD-10-CM | POA: Diagnosis not present

## 2018-07-23 DIAGNOSIS — J3081 Allergic rhinitis due to animal (cat) (dog) hair and dander: Secondary | ICD-10-CM | POA: Diagnosis not present

## 2018-07-23 DIAGNOSIS — J3089 Other allergic rhinitis: Secondary | ICD-10-CM | POA: Diagnosis not present

## 2018-07-23 DIAGNOSIS — J301 Allergic rhinitis due to pollen: Secondary | ICD-10-CM | POA: Diagnosis not present

## 2018-07-29 DIAGNOSIS — J3081 Allergic rhinitis due to animal (cat) (dog) hair and dander: Secondary | ICD-10-CM | POA: Diagnosis not present

## 2018-07-29 DIAGNOSIS — J3089 Other allergic rhinitis: Secondary | ICD-10-CM | POA: Diagnosis not present

## 2018-07-29 DIAGNOSIS — J301 Allergic rhinitis due to pollen: Secondary | ICD-10-CM | POA: Diagnosis not present

## 2018-08-05 DIAGNOSIS — J301 Allergic rhinitis due to pollen: Secondary | ICD-10-CM | POA: Diagnosis not present

## 2018-08-05 DIAGNOSIS — J3089 Other allergic rhinitis: Secondary | ICD-10-CM | POA: Diagnosis not present

## 2018-08-05 DIAGNOSIS — J3081 Allergic rhinitis due to animal (cat) (dog) hair and dander: Secondary | ICD-10-CM | POA: Diagnosis not present

## 2018-08-11 DIAGNOSIS — J3081 Allergic rhinitis due to animal (cat) (dog) hair and dander: Secondary | ICD-10-CM | POA: Diagnosis not present

## 2018-08-11 DIAGNOSIS — J3089 Other allergic rhinitis: Secondary | ICD-10-CM | POA: Diagnosis not present

## 2018-08-11 DIAGNOSIS — J301 Allergic rhinitis due to pollen: Secondary | ICD-10-CM | POA: Diagnosis not present

## 2018-08-20 DIAGNOSIS — J301 Allergic rhinitis due to pollen: Secondary | ICD-10-CM | POA: Diagnosis not present

## 2018-08-20 DIAGNOSIS — J3089 Other allergic rhinitis: Secondary | ICD-10-CM | POA: Diagnosis not present

## 2018-08-20 DIAGNOSIS — J3081 Allergic rhinitis due to animal (cat) (dog) hair and dander: Secondary | ICD-10-CM | POA: Diagnosis not present

## 2018-08-25 DIAGNOSIS — J3089 Other allergic rhinitis: Secondary | ICD-10-CM | POA: Diagnosis not present

## 2018-08-25 DIAGNOSIS — J301 Allergic rhinitis due to pollen: Secondary | ICD-10-CM | POA: Diagnosis not present

## 2018-09-07 DIAGNOSIS — J301 Allergic rhinitis due to pollen: Secondary | ICD-10-CM | POA: Diagnosis not present

## 2018-09-07 DIAGNOSIS — J3081 Allergic rhinitis due to animal (cat) (dog) hair and dander: Secondary | ICD-10-CM | POA: Diagnosis not present

## 2018-09-07 DIAGNOSIS — J3089 Other allergic rhinitis: Secondary | ICD-10-CM | POA: Diagnosis not present

## 2018-09-15 DIAGNOSIS — J3081 Allergic rhinitis due to animal (cat) (dog) hair and dander: Secondary | ICD-10-CM | POA: Diagnosis not present

## 2018-09-15 DIAGNOSIS — J3089 Other allergic rhinitis: Secondary | ICD-10-CM | POA: Diagnosis not present

## 2018-09-15 DIAGNOSIS — J301 Allergic rhinitis due to pollen: Secondary | ICD-10-CM | POA: Diagnosis not present

## 2018-09-20 ENCOUNTER — Telehealth: Payer: Self-pay | Admitting: Family Medicine

## 2018-09-20 ENCOUNTER — Other Ambulatory Visit: Payer: Self-pay | Admitting: Family Medicine

## 2018-09-20 MED ORDER — METOPROLOL SUCCINATE ER 25 MG PO TB24: ORAL_TABLET | ORAL | 3 refills | Status: DC

## 2018-09-20 MED ORDER — POTASSIUM CHLORIDE CRYS ER 10 MEQ PO TBCR: EXTENDED_RELEASE_TABLET | ORAL | 3 refills | Status: DC

## 2018-09-20 MED ORDER — MONTELUKAST SODIUM 10 MG PO TABS: 10.0000 mg | ORAL_TABLET | Freq: Every day | ORAL | 3 refills | Status: DC

## 2018-09-20 MED ORDER — FLUTICASONE PROPIONATE 50 MCG/ACT NA SUSP: NASAL | 3 refills | Status: DC

## 2018-09-20 MED ORDER — ESOMEPRAZOLE MAGNESIUM 40 MG PO CPDR: 40.0000 mg | DELAYED_RELEASE_CAPSULE | Freq: Every day | ORAL | 3 refills | Status: DC

## 2018-09-20 NOTE — Telephone Encounter (Signed)
Copied from [place]. Topic: Quick Communication - Rx Refill/Question >> Sep 20, 2018  1:08 PM Izola Price, [place] A wrote: Medication:montelukast (SINGULAIR) 10 MG tablet ,metoprolol succinate (TOPROL-XL) 25 MG 24 hr tablet,fluticasone (FLONASE) 50 MCG/ACT nasal spray ,esomeprazole (NEXIUM) 40 MG capsule,potassium chloride (KLOR-CON M10) 10 MEQ tablet   Has the patient contacted their pharmacy? Yes. (Agent: If no, request that the patient contact the pharmacy for the refill.) (Agent: If yes, when and what did the pharmacy advise?)Contact pcp  Preferred Pharmacy (with phone number or street name): [place], [place] [phone] (Phone) [phone] (Fax)    Agent: Please be advised that RX refills may take up to 3 business days. We ask that you follow-up with your pharmacy.

## 2018-09-23 DIAGNOSIS — J3089 Other allergic rhinitis: Secondary | ICD-10-CM | POA: Diagnosis not present

## 2018-09-23 DIAGNOSIS — J301 Allergic rhinitis due to pollen: Secondary | ICD-10-CM | POA: Diagnosis not present

## 2018-09-23 DIAGNOSIS — J3081 Allergic rhinitis due to animal (cat) (dog) hair and dander: Secondary | ICD-10-CM | POA: Diagnosis not present

## 2018-09-30 DIAGNOSIS — J3089 Other allergic rhinitis: Secondary | ICD-10-CM | POA: Diagnosis not present

## 2018-09-30 DIAGNOSIS — J301 Allergic rhinitis due to pollen: Secondary | ICD-10-CM | POA: Diagnosis not present

## 2018-09-30 DIAGNOSIS — J3081 Allergic rhinitis due to animal (cat) (dog) hair and dander: Secondary | ICD-10-CM | POA: Diagnosis not present

## 2018-10-06 DIAGNOSIS — J3081 Allergic rhinitis due to animal (cat) (dog) hair and dander: Secondary | ICD-10-CM | POA: Diagnosis not present

## 2018-10-06 DIAGNOSIS — J3089 Other allergic rhinitis: Secondary | ICD-10-CM | POA: Diagnosis not present

## 2018-10-06 DIAGNOSIS — J301 Allergic rhinitis due to pollen: Secondary | ICD-10-CM | POA: Diagnosis not present

## 2018-10-08 DIAGNOSIS — J301 Allergic rhinitis due to pollen: Secondary | ICD-10-CM | POA: Diagnosis not present

## 2018-10-08 DIAGNOSIS — J3081 Allergic rhinitis due to animal (cat) (dog) hair and dander: Secondary | ICD-10-CM | POA: Diagnosis not present

## 2018-10-08 DIAGNOSIS — J3089 Other allergic rhinitis: Secondary | ICD-10-CM | POA: Diagnosis not present

## 2018-10-09 DIAGNOSIS — N3000 Acute cystitis without hematuria: Secondary | ICD-10-CM | POA: Diagnosis not present

## 2018-10-09 DIAGNOSIS — R350 Frequency of micturition: Secondary | ICD-10-CM | POA: Diagnosis not present

## 2018-10-09 DIAGNOSIS — Z23 Encounter for immunization: Secondary | ICD-10-CM | POA: Diagnosis not present

## 2018-10-15 DIAGNOSIS — J3089 Other allergic rhinitis: Secondary | ICD-10-CM | POA: Diagnosis not present

## 2018-10-15 DIAGNOSIS — J3081 Allergic rhinitis due to animal (cat) (dog) hair and dander: Secondary | ICD-10-CM | POA: Diagnosis not present

## 2018-10-15 DIAGNOSIS — J301 Allergic rhinitis due to pollen: Secondary | ICD-10-CM | POA: Diagnosis not present

## 2018-10-22 DIAGNOSIS — J301 Allergic rhinitis due to pollen: Secondary | ICD-10-CM | POA: Diagnosis not present

## 2018-10-22 DIAGNOSIS — J3081 Allergic rhinitis due to animal (cat) (dog) hair and dander: Secondary | ICD-10-CM | POA: Diagnosis not present

## 2018-10-22 DIAGNOSIS — J3089 Other allergic rhinitis: Secondary | ICD-10-CM | POA: Diagnosis not present

## 2018-10-26 ENCOUNTER — Encounter: Payer: Self-pay | Admitting: Family Medicine

## 2018-10-27 ENCOUNTER — Encounter: Payer: Self-pay | Admitting: Family Medicine

## 2018-10-27 ENCOUNTER — Other Ambulatory Visit: Payer: Self-pay

## 2018-10-27 ENCOUNTER — Ambulatory Visit: Payer: Self-pay

## 2018-10-27 ENCOUNTER — Telehealth (INDEPENDENT_AMBULATORY_CARE_PROVIDER_SITE_OTHER): Payer: BC Managed Care – PPO | Admitting: Family Medicine

## 2018-10-27 DIAGNOSIS — J301 Allergic rhinitis due to pollen: Secondary | ICD-10-CM | POA: Diagnosis not present

## 2018-10-27 DIAGNOSIS — B370 Candidal stomatitis: Secondary | ICD-10-CM | POA: Diagnosis not present

## 2018-10-27 DIAGNOSIS — J3081 Allergic rhinitis due to animal (cat) (dog) hair and dander: Secondary | ICD-10-CM | POA: Diagnosis not present

## 2018-10-27 DIAGNOSIS — J3089 Other allergic rhinitis: Secondary | ICD-10-CM | POA: Diagnosis not present

## 2018-10-27 MED ORDER — NYSTATIN 100000 UNIT/ML MT SUSP: 5.0000 mL | Freq: Four times a day (QID) | OROMUCOSAL | 0 refills | Status: DC

## 2018-10-27 NOTE — Telephone Encounter (Signed)
Incoming call from Patient who states that she was on an antibiotic.  Completed the antibiotic for UTI.  Has now developed thrushto make an appointment. .  Requested an appointment on mycharrt.  Onset was Sunday.  Rates it, mild to moderate. States the is a sore under her tongue.  Denies fever or difficulty breathing difficulty. Transferred Pt. To office to make appointment.    Reason for Disposition . White patches that stick to tongue or inner cheek  Answer Assessment - Initial Assessment Questions 1. ONSET: "When did the mouth start hurting?" (e.g., hours or days ago)    Sunday 2. SEVERITY: "How bad is the pain?" (Scale 1-10; mild, moderate or severe)   - MILD (1-3):  doesn't interfere with eating or normal activities   - MODERATE (4-7): interferes with eating some solids and normal activities   - SEVERE (8-10):  excruciating pain, interferes with most normal activities   - SEVERE DYSPHAGIA: can't swallow liquids, drooling     Mild to moderate 3. SORES: "Are there any sores or ulcers in the mouth?" If so, ask: "What part of the mouth are the sores in?"     Under the tongue 4. FEVER: "Do you have a fever?" If so, ask: "What is your temperature, how was it measured, and when did it start?"     denies 5. CAUSE: "What do you think is causing the mouth pain?"     Thrush 6. OTHER SYMPTOMS: "Do you have any other symptoms?" (e.g., difficulty breathing)     Denies difficulty breathing.  A lot of discomfort  In the mouth.  Protocols used: MOUTH PAIN-A-AH

## 2018-10-27 NOTE — Telephone Encounter (Signed)
Patient has an appointment today at 4pm

## 2018-10-27 NOTE — Telephone Encounter (Signed)
She was seen virtually today  ?

## 2018-10-27 NOTE — Progress Notes (Signed)
Virtual Visit via Video Note  I connected with the patient on 10/27/18 at  4:00 PM EDT by a video enabled telemedicine application and verified that I am speaking with the correct person using two identifiers.  Location patient: home Location provider:work or home office Persons participating in the virtual visit: patient, provider  I discussed the limitations of evaluation and management by telemedicine and the availability of in person appointments. The patient expressed understanding and agreed to proceed.   HPI: Here for what she thinks is thrush. She was seen at a [facility] on 10-09-18 for a UTI and was treated with Keflex. This worked well, but for the past 2 days she has had soreness on the tongue and on the insides of her cheeks. There is a whitish film on the tongue.    ROS: See pertinent positives and negatives per HPI.  Past Medical History:  Diagnosis Date  . Allergic rhinitis    sees Dr. Donneta Romberg   . Asthma    sees Dr. Donneta Romberg   . 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 DUB (dysfunctional uterine bleeding)   . GERD (gastroesophageal reflux disease)   . History of bone density study 03-22-13   normal   . Hypertension   . NSVD (normal spontaneous vaginal delivery)    X2  . Pneumonia   . Routine gynecological examination    sees Dr. Uvaldo Rising     Past Surgical History:  Procedure Laterality Date  . ABDOMINAL HYSTERECTOMY     LAVH  . ANTERIOR CRUCIATE LIGAMENT REPAIR     right knee with medial and lateral meniscectomies 09/04/08  . COLONOSCOPY  12-28-12   per Dr. Amedeo Plenty, tubular adenoma, repeat in 5 yrs   . DILATION AND CURETTAGE OF UTERUS     x 2  . KNEE ARTHROSCOPY Left 2018  . MYOMECTOMY ABDOMINAL APPROACH    . TONSILLECTOMY    . WISDOM TOOTH EXTRACTION      Family History  Problem Relation Age of Onset  . Hypertension Mother   . Cancer Father 68       PANCREATIC  . Other Other        bowel  disease  . Diabetes Other   . Hypertension Other   . Coronary artery disease Other   . Heart disease Maternal Grandmother   . Cancer Maternal Grandfather        COLON  . Heart disease Paternal Grandmother   . Heart disease Paternal Grandfather   . Immunocompromised Brother   . Breast cancer Neg Hx      Current Outpatient Medications:  .  Calcium Carbonate-Vit D-Min (CALCIUM 1200 PO), Take by mouth., Disp: , Rfl:  .  cetirizine (ZYRTEC) 10 MG tablet, Take 10 mg by mouth daily.  , Disp: , Rfl:  .  ciprofloxacin (CIPRO) 500 MG tablet, Take 1 tablet (500 mg total) by mouth 2 (two) times daily., Disp: 14 tablet, Rfl: 0 .  esomeprazole (NEXIUM) 40 MG capsule, Take 1 capsule (40 mg total) by mouth daily., Disp: 90 capsule, Rfl: 3 .  fluticasone (FLONASE) 50 MCG/ACT nasal spray, USE 2 SPRAYS IN EACH       NOSTRIL DAILY, Disp: 48 g, Rfl: 3 .  levalbuterol (XOPENEX HFA) 45 MCG/ACT inhaler, Inhale 1-2 puffs into the lungs every 4 (four) hours as needed for wheezing or shortness of breath., Disp: 3 Inhaler, Rfl: 3 .  metoprolol succinate (  TOPROL-XL) 25 MG 24 hr tablet, TAKE 1 TABLET DAILY, Disp: 90 tablet, Rfl: 3 .  montelukast (SINGULAIR) 10 MG tablet, Take 1 tablet (10 mg total) by mouth at bedtime., Disp: 90 tablet, Rfl: 3 .  nystatin (MYCOSTATIN) 100000 UNIT/ML suspension, Take 5 mLs (500,000 Units total) by mouth 4 (four) times daily., Disp: 120 mL, Rfl: 0 .  potassium chloride (KLOR-CON M10) 10 MEQ tablet, TAKE 1 TABLET DAILY, Disp: 90 tablet, Rfl: 3  EXAM:  VITALS per patient if applicable:  GENERAL: alert, oriented, appears well and in no acute distress  HEENT: atraumatic, conjunttiva clear, no obvious abnormalities on inspection of external nose and ears  NECK: normal movements of the head and neck  LUNGS: on inspection no signs of respiratory distress, breathing rate appears normal, no obvious gross SOB, gasping or wheezing  CV: no obvious cyanosis  MS: moves all visible  extremities without noticeable abnormality  PSYCH/NEURO: pleasant and cooperative, no obvious depression or anxiety, speech and thought processing grossly intact  ASSESSMENT AND PLAN: Thrush, treat with Nystatin suspension QID.  Alysia Penna, MD  Discussed the following assessment and plan:  No diagnosis found.     I discussed the assessment and treatment plan with the patient. The patient was provided an opportunity to ask questions and all were answered. The patient agreed with the plan and demonstrated an understanding of the instructions.   The patient was advised to call back or seek an in-person evaluation if the symptoms worsen or if the condition fails to improve as anticipated.

## 2018-10-29 DIAGNOSIS — J3089 Other allergic rhinitis: Secondary | ICD-10-CM | POA: Diagnosis not present

## 2018-10-29 DIAGNOSIS — J301 Allergic rhinitis due to pollen: Secondary | ICD-10-CM | POA: Diagnosis not present

## 2018-10-29 DIAGNOSIS — J3081 Allergic rhinitis due to animal (cat) (dog) hair and dander: Secondary | ICD-10-CM | POA: Diagnosis not present

## 2018-11-02 DIAGNOSIS — J3089 Other allergic rhinitis: Secondary | ICD-10-CM | POA: Diagnosis not present

## 2018-11-02 DIAGNOSIS — J301 Allergic rhinitis due to pollen: Secondary | ICD-10-CM | POA: Diagnosis not present

## 2018-11-02 DIAGNOSIS — J3081 Allergic rhinitis due to animal (cat) (dog) hair and dander: Secondary | ICD-10-CM | POA: Diagnosis not present

## 2018-11-05 DIAGNOSIS — J3089 Other allergic rhinitis: Secondary | ICD-10-CM | POA: Diagnosis not present

## 2018-11-05 DIAGNOSIS — J3081 Allergic rhinitis due to animal (cat) (dog) hair and dander: Secondary | ICD-10-CM | POA: Diagnosis not present

## 2018-11-05 DIAGNOSIS — J301 Allergic rhinitis due to pollen: Secondary | ICD-10-CM | POA: Diagnosis not present

## 2018-11-09 DIAGNOSIS — J301 Allergic rhinitis due to pollen: Secondary | ICD-10-CM | POA: Diagnosis not present

## 2018-11-09 DIAGNOSIS — J3081 Allergic rhinitis due to animal (cat) (dog) hair and dander: Secondary | ICD-10-CM | POA: Diagnosis not present

## 2018-11-09 DIAGNOSIS — J3089 Other allergic rhinitis: Secondary | ICD-10-CM | POA: Diagnosis not present

## 2018-11-19 DIAGNOSIS — J3081 Allergic rhinitis due to animal (cat) (dog) hair and dander: Secondary | ICD-10-CM | POA: Diagnosis not present

## 2018-11-19 DIAGNOSIS — J301 Allergic rhinitis due to pollen: Secondary | ICD-10-CM | POA: Diagnosis not present

## 2018-11-19 DIAGNOSIS — J3089 Other allergic rhinitis: Secondary | ICD-10-CM | POA: Diagnosis not present

## 2018-11-25 DIAGNOSIS — J3081 Allergic rhinitis due to animal (cat) (dog) hair and dander: Secondary | ICD-10-CM | POA: Diagnosis not present

## 2018-11-25 DIAGNOSIS — J301 Allergic rhinitis due to pollen: Secondary | ICD-10-CM | POA: Diagnosis not present

## 2018-11-25 DIAGNOSIS — J3089 Other allergic rhinitis: Secondary | ICD-10-CM | POA: Diagnosis not present

## 2018-11-30 ENCOUNTER — Other Ambulatory Visit: Payer: Self-pay

## 2018-11-30 DIAGNOSIS — Z20822 Contact with and (suspected) exposure to covid-19: Secondary | ICD-10-CM

## 2018-12-01 LAB — NOVEL CORONAVIRUS, NAA: SARS-CoV-2, NAA: NOT DETECTED

## 2018-12-02 DIAGNOSIS — J3089 Other allergic rhinitis: Secondary | ICD-10-CM | POA: Diagnosis not present

## 2018-12-02 DIAGNOSIS — J3081 Allergic rhinitis due to animal (cat) (dog) hair and dander: Secondary | ICD-10-CM | POA: Diagnosis not present

## 2018-12-02 DIAGNOSIS — J301 Allergic rhinitis due to pollen: Secondary | ICD-10-CM | POA: Diagnosis not present

## 2018-12-08 DIAGNOSIS — J3089 Other allergic rhinitis: Secondary | ICD-10-CM | POA: Diagnosis not present

## 2018-12-08 DIAGNOSIS — J3081 Allergic rhinitis due to animal (cat) (dog) hair and dander: Secondary | ICD-10-CM | POA: Diagnosis not present

## 2018-12-08 DIAGNOSIS — J301 Allergic rhinitis due to pollen: Secondary | ICD-10-CM | POA: Diagnosis not present

## 2018-12-13 ENCOUNTER — Telehealth: Payer: Self-pay | Admitting: Family Medicine

## 2018-12-13 NOTE — Telephone Encounter (Signed)
Pt test neg for covid 19 on 11-30-2018 and he husband was negative on 11-30-2018 and he received his covid 19 test on 12-09-2018 and he is positive now. Pt would like to know if she get tested again. Pt is having nasal congestion and pt temp is 98.7

## 2018-12-14 ENCOUNTER — Other Ambulatory Visit: Payer: Self-pay

## 2018-12-14 DIAGNOSIS — Z20822 Contact with and (suspected) exposure to covid-19: Secondary | ICD-10-CM

## 2018-12-14 NOTE — Telephone Encounter (Signed)
Yes she should be tested again as well

## 2018-12-14 NOTE — Telephone Encounter (Signed)
Patient notified of update  and verbalized understanding. 

## 2018-12-15 ENCOUNTER — Telehealth: Payer: Self-pay

## 2018-12-15 ENCOUNTER — Encounter: Payer: BLUE CROSS/BLUE SHIELD | Admitting: Family Medicine

## 2018-12-15 IMAGING — US ULTRASOUND LEFT BREAST LIMITED
1 series · 4 of 4 positions shown · non-contrast
Comparison: Previous exam(s).

CLINICAL DATA: Screening recall for a possible asymmetry in the
right breast. On review of the screening images, there is a small
circumscribed mass in the left breast projecting medial to the
nipple. This is likely the cyst, but ultrasound will be performed to
chew confirm this.

EXAM:
DIGITAL DIAGNOSTIC RIGHT MAMMOGRAM WITH CAD AND TOMO
ULTRASOUND LEFT BREAST

[Series 1: ultrasound left breast limited · 0.06mm/px · 4 of 4 slices shown]
[im 1/4]
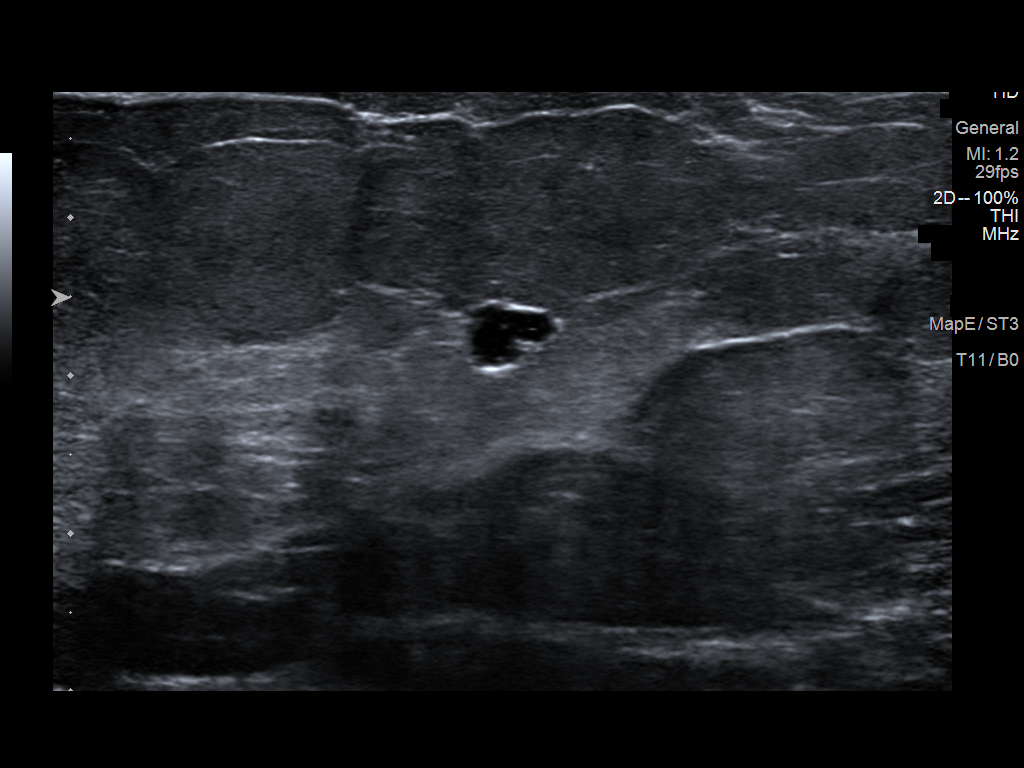
[im 2/4]
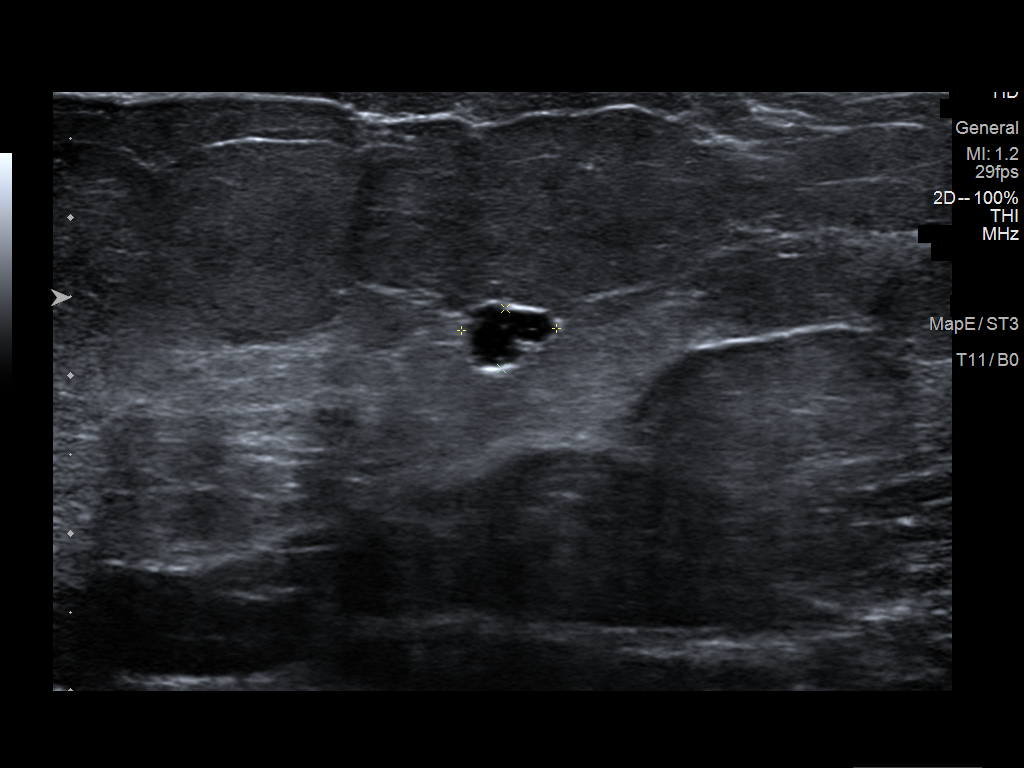
[im 3/4]
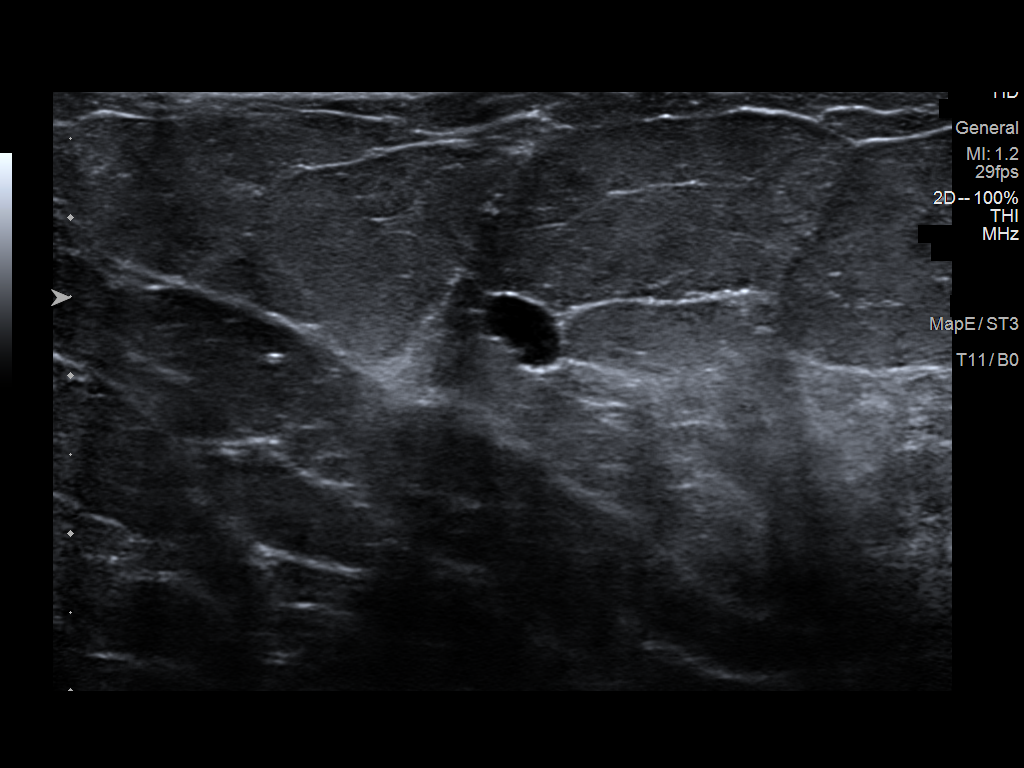
[im 4/4]
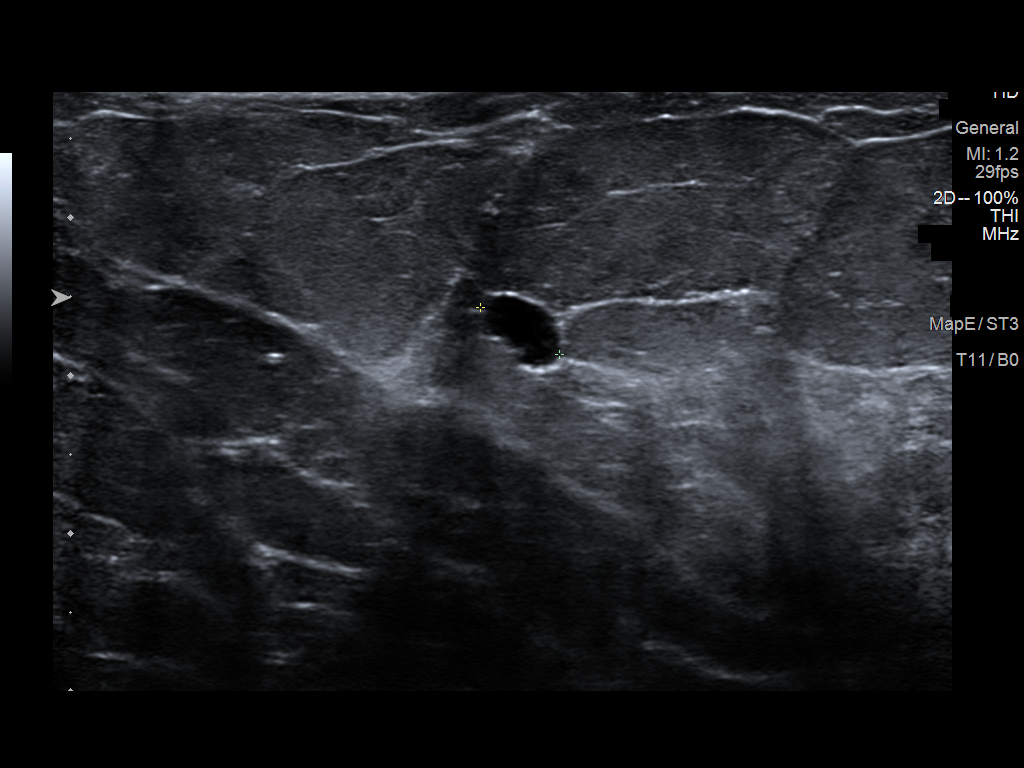

[4 of 4 positions shown; findings below may reference images not displayed]

ACR Breast Density Category b: There are scattered areas of
fibroglandular density.
FINDINGS: In the right breast, the small asymmetry disperses on the
spot-compression diagnostic 3D images consistent with closely
approximated normal fibroglandular tissue. No underlying mass or
significant persistent asymmetry. There is no architectural
distortion.

Mammographic images were processed with CAD.

Targeted ultrasound is performed, showing a oval cyst in the left
breast at 10 o'clock, 3 cm the nipple, measuring 6 x 4 x 6 mm,
corresponding in size, shape and location to the mammographic
finding. There are no solid masses or suspicious lesions.
IMPRESSION: 1. No evidence of breast malignancy.
2. Asymmetry on the right was due to closely approximated
fibroglandular tissue.
3. Small mass on the left is a benign cyst.

RECOMMENDATION:
Screening mammogram in one year.(Code:D7-O-0PD)

I have discussed the findings and recommendations with the patient.
Results were also provided in writing at the conclusion of the
visit. If applicable, a reminder letter will be sent to the patient
regarding the next appointment.

BI-RADS CATEGORY  2: Benign.

## 2018-12-15 NOTE — Telephone Encounter (Signed)
rec'd call from Agent at [facility].  Was informed that pt. had specimen submitted for "MODY Genetic Profile", but the diagnosis was "Suspected 2019 Novel Coronavirus Infection", on 12/14/18.  Nurse noted that there was also an order placed for Novel Coronavirus on same date.  Advised the Agent will further investigate to clarify if the MODY Genetic Profile was possibly placed in error.

## 2018-12-17 ENCOUNTER — Other Ambulatory Visit: Payer: Self-pay

## 2018-12-17 DIAGNOSIS — Z20822 Contact with and (suspected) exposure to covid-19: Secondary | ICD-10-CM

## 2018-12-17 NOTE — Telephone Encounter (Signed)
Pt called and left message to return call. Pt will need to go to be retested due to incorrect order being placed in error.

## 2018-12-17 NOTE — Telephone Encounter (Signed)
Olivia Carr with [facility] calling to report pt.'s test has been cancelled and pt. Needs to retest for COVID 19.Information passed to Team Lead Cristal Generous.

## 2018-12-19 LAB — NOVEL CORONAVIRUS, NAA: SARS-CoV-2, NAA: NOT DETECTED

## 2018-12-20 ENCOUNTER — Other Ambulatory Visit: Payer: Self-pay

## 2018-12-21 ENCOUNTER — Ambulatory Visit (INDEPENDENT_AMBULATORY_CARE_PROVIDER_SITE_OTHER): Payer: BC Managed Care – PPO | Admitting: Family Medicine

## 2018-12-21 ENCOUNTER — Encounter: Payer: Self-pay | Admitting: Family Medicine

## 2018-12-21 VITALS — BP 110/82 | HR 85 | Temp 97.5°F | Ht 64.25 in | Wt 202.0 lb

## 2018-12-21 DIAGNOSIS — Z0000 Encounter for general adult medical examination without abnormal findings: Secondary | ICD-10-CM | POA: Diagnosis not present

## 2018-12-21 DIAGNOSIS — J3089 Other allergic rhinitis: Secondary | ICD-10-CM | POA: Diagnosis not present

## 2018-12-21 DIAGNOSIS — J3081 Allergic rhinitis due to animal (cat) (dog) hair and dander: Secondary | ICD-10-CM | POA: Diagnosis not present

## 2018-12-21 DIAGNOSIS — J301 Allergic rhinitis due to pollen: Secondary | ICD-10-CM | POA: Diagnosis not present

## 2018-12-21 LAB — TSH: TSH: 1.11 u[IU]/mL (ref 0.35–4.50)

## 2018-12-21 LAB — HEPATIC FUNCTION PANEL
ALT: 12 U/L (ref 0–35)
AST: 15 U/L (ref 0–37)
Albumin: 3.7 g/dL (ref 3.5–5.2)
Alkaline Phosphatase: 65 U/L (ref 39–117)
Bilirubin, Direct: 0.1 mg/dL (ref 0.0–0.3)
Total Bilirubin: 0.9 mg/dL (ref 0.2–1.2)
Total Protein: 6.5 g/dL (ref 6.0–8.3)

## 2018-12-21 LAB — CBC WITH DIFFERENTIAL/PLATELET
Basophils Absolute: 0.1 10*3/uL (ref 0.0–0.1)
Basophils Relative: 1 % (ref 0.0–3.0)
Eosinophils Absolute: 0.1 10*3/uL (ref 0.0–0.7)
Eosinophils Relative: 2 % (ref 0.0–5.0)
HCT: 40.1 % (ref 36.0–46.0)
Hemoglobin: 12.9 g/dL (ref 12.0–15.0)
Lymphocytes Relative: 28 % (ref 12.0–46.0)
Lymphs Abs: 1.8 10*3/uL (ref 0.7–4.0)
MCHC: 32.1 g/dL (ref 30.0–36.0)
MCV: 81.8 fl (ref 78.0–100.0)
Monocytes Absolute: 0.5 10*3/uL (ref 0.1–1.0)
Monocytes Relative: 7.1 % (ref 3.0–12.0)
Neutro Abs: 3.9 10*3/uL (ref 1.4–7.7)
Neutrophils Relative %: 61.9 % (ref 43.0–77.0)
Platelets: 278 10*3/uL (ref 150.0–400.0)
RBC: 4.9 Mil/uL (ref 3.87–5.11)
RDW: 15.1 % (ref 11.5–15.5)
WBC: 6.4 10*3/uL (ref 4.0–10.5)

## 2018-12-21 LAB — URINALYSIS
Bilirubin Urine: NEGATIVE
Hgb urine dipstick: NEGATIVE
Ketones, ur: NEGATIVE
Leukocytes,Ua: NEGATIVE
Nitrite: NEGATIVE
Specific Gravity, Urine: 1.01 (ref 1.000–1.030)
Total Protein, Urine: NEGATIVE
Urine Glucose: NEGATIVE
Urobilinogen, UA: 0.2 (ref 0.0–1.0)
pH: 6 (ref 5.0–8.0)

## 2018-12-21 LAB — BASIC METABOLIC PANEL
BUN: 14 mg/dL (ref 6–23)
CO2: 29 mEq/L (ref 19–32)
Calcium: 9.3 mg/dL (ref 8.4–10.5)
Chloride: 105 mEq/L (ref 96–112)
Creatinine, Ser: 0.7 mg/dL (ref 0.40–1.20)
GFR: 86.5 mL/min (ref >60.00)
Glucose, Bld: 89 mg/dL (ref 70–99)
Potassium: 4.1 mEq/L (ref 3.5–5.1)
Sodium: 140 mEq/L (ref 135–145)

## 2018-12-21 LAB — LIPID PANEL
Cholesterol: 180 mg/dL (ref 0–200)
HDL: 58.8 mg/dL (ref >39.00)
LDL Cholesterol: 105 mg/dL — ABNORMAL HIGH (ref 0–99)
NonHDL: 121.56
Total CHOL/HDL Ratio: 3
Triglycerides: 81 mg/dL (ref 0.0–149.0)
VLDL: 16.2 mg/dL (ref 0.0–40.0)

## 2018-12-21 NOTE — Patient Instructions (Addendum)
Health Maintenance Due  Topic Date Due  . HIV Screening  10/13/1977  . PAP SMEAR-Modifier  03/09/2018    Depression screen [facility] 2/9 12/21/2018 12/11/2017  Decreased Interest 0 0  Down, Depressed, Hopeless 0 0  PHQ - 2 Score 0 0

## 2018-12-21 NOTE — Progress Notes (Signed)
   Subjective:    Patient ID: Olivia Carr, female    DOB: 10/16/1960, 58 y.o.   MRN: [id number]  HPI Here for a well exam. She feels great.    Review of Systems  Constitutional: Negative.   HENT: Negative.   Eyes: Negative.   Respiratory: Negative.   Cardiovascular: Negative.   Gastrointestinal: Negative.   Genitourinary: Negative for decreased urine volume, difficulty urinating, dyspareunia, dysuria, enuresis, flank pain, frequency, hematuria, pelvic pain and urgency.  Musculoskeletal: Negative.   Skin: Negative.   Neurological: Negative.   Psychiatric/Behavioral: Negative.        Objective:   Physical Exam Constitutional:      General: She is not in acute distress.    Appearance: She is well-developed.  HENT:     Head: Normocephalic and atraumatic.     Right Ear: External ear normal.     Left Ear: External ear normal.     Nose: Nose normal.     Mouth/Throat:     Pharynx: No oropharyngeal exudate.  Eyes:     General: No scleral icterus.    Conjunctiva/sclera: Conjunctivae normal.     Pupils: Pupils are equal, round, and reactive to light.  Neck:     Musculoskeletal: Normal range of motion and neck supple.     Thyroid: No thyromegaly.     Vascular: No JVD.  Cardiovascular:     Rate and Rhythm: Normal rate and regular rhythm.     Heart sounds: Normal heart sounds. No murmur. No friction rub. No gallop.   Pulmonary:     Effort: Pulmonary effort is normal. No respiratory distress.     Breath sounds: Normal breath sounds. No wheezing or rales.  Chest:     Chest wall: No tenderness.  Abdominal:     General: Bowel sounds are normal. There is no distension.     Palpations: Abdomen is soft. There is no mass.     Tenderness: There is no abdominal tenderness. There is no guarding or rebound.  Musculoskeletal: Normal range of motion.        General: No tenderness.  Lymphadenopathy:     Cervical: No cervical adenopathy.  Skin:    General: Skin is warm and dry.      Findings: No erythema or rash.  Neurological:     Mental Status: She is alert and oriented to person, place, and time.     Cranial Nerves: No cranial nerve deficit.     Motor: No abnormal muscle tone.     Coordination: Coordination normal.     Deep Tendon Reflexes: Reflexes are normal and symmetric. Reflexes normal.  Psychiatric:        Behavior: Behavior normal.        Thought Content: Thought content normal.        Judgment: Judgment normal.           Assessment & Plan:  Well exam. We discussed diet and exercise. Get fasting labs.  Alysia Penna, MD

## 2018-12-22 DIAGNOSIS — J3089 Other allergic rhinitis: Secondary | ICD-10-CM | POA: Diagnosis not present

## 2018-12-22 DIAGNOSIS — J3081 Allergic rhinitis due to animal (cat) (dog) hair and dander: Secondary | ICD-10-CM | POA: Diagnosis not present

## 2018-12-28 DIAGNOSIS — J301 Allergic rhinitis due to pollen: Secondary | ICD-10-CM | POA: Diagnosis not present

## 2018-12-28 DIAGNOSIS — J3081 Allergic rhinitis due to animal (cat) (dog) hair and dander: Secondary | ICD-10-CM | POA: Diagnosis not present

## 2018-12-28 DIAGNOSIS — J3089 Other allergic rhinitis: Secondary | ICD-10-CM | POA: Diagnosis not present

## 2018-12-30 LAB — MATURITY ONSET DIABETES OF THE YOUNG(MODY)GENETIC PROFILE

## 2019-01-05 DIAGNOSIS — J301 Allergic rhinitis due to pollen: Secondary | ICD-10-CM | POA: Diagnosis not present

## 2019-01-05 DIAGNOSIS — J3081 Allergic rhinitis due to animal (cat) (dog) hair and dander: Secondary | ICD-10-CM | POA: Diagnosis not present

## 2019-01-05 DIAGNOSIS — J3089 Other allergic rhinitis: Secondary | ICD-10-CM | POA: Diagnosis not present

## 2019-01-11 DIAGNOSIS — J3089 Other allergic rhinitis: Secondary | ICD-10-CM | POA: Diagnosis not present

## 2019-01-11 DIAGNOSIS — J3081 Allergic rhinitis due to animal (cat) (dog) hair and dander: Secondary | ICD-10-CM | POA: Diagnosis not present

## 2019-01-11 DIAGNOSIS — J301 Allergic rhinitis due to pollen: Secondary | ICD-10-CM | POA: Diagnosis not present

## 2019-01-14 DIAGNOSIS — J301 Allergic rhinitis due to pollen: Secondary | ICD-10-CM | POA: Diagnosis not present

## 2019-01-14 DIAGNOSIS — J3081 Allergic rhinitis due to animal (cat) (dog) hair and dander: Secondary | ICD-10-CM | POA: Diagnosis not present

## 2019-01-14 DIAGNOSIS — J3089 Other allergic rhinitis: Secondary | ICD-10-CM | POA: Diagnosis not present

## 2019-01-17 DIAGNOSIS — J3089 Other allergic rhinitis: Secondary | ICD-10-CM | POA: Diagnosis not present

## 2019-01-17 DIAGNOSIS — J3081 Allergic rhinitis due to animal (cat) (dog) hair and dander: Secondary | ICD-10-CM | POA: Diagnosis not present

## 2019-01-17 DIAGNOSIS — J301 Allergic rhinitis due to pollen: Secondary | ICD-10-CM | POA: Diagnosis not present

## 2019-01-19 DIAGNOSIS — J3089 Other allergic rhinitis: Secondary | ICD-10-CM | POA: Diagnosis not present

## 2019-01-19 DIAGNOSIS — J3081 Allergic rhinitis due to animal (cat) (dog) hair and dander: Secondary | ICD-10-CM | POA: Diagnosis not present

## 2019-01-19 DIAGNOSIS — J301 Allergic rhinitis due to pollen: Secondary | ICD-10-CM | POA: Diagnosis not present

## 2019-01-24 DIAGNOSIS — J3089 Other allergic rhinitis: Secondary | ICD-10-CM | POA: Diagnosis not present

## 2019-01-24 DIAGNOSIS — J301 Allergic rhinitis due to pollen: Secondary | ICD-10-CM | POA: Diagnosis not present

## 2019-01-24 DIAGNOSIS — J3081 Allergic rhinitis due to animal (cat) (dog) hair and dander: Secondary | ICD-10-CM | POA: Diagnosis not present

## 2019-02-01 ENCOUNTER — Other Ambulatory Visit: Payer: Self-pay | Admitting: Family Medicine

## 2019-02-01 DIAGNOSIS — Z1231 Encounter for screening mammogram for malignant neoplasm of breast: Secondary | ICD-10-CM

## 2019-02-02 DIAGNOSIS — J3081 Allergic rhinitis due to animal (cat) (dog) hair and dander: Secondary | ICD-10-CM | POA: Diagnosis not present

## 2019-02-02 DIAGNOSIS — J301 Allergic rhinitis due to pollen: Secondary | ICD-10-CM | POA: Diagnosis not present

## 2019-02-02 DIAGNOSIS — J3089 Other allergic rhinitis: Secondary | ICD-10-CM | POA: Diagnosis not present

## 2019-02-04 ENCOUNTER — Ambulatory Visit: Payer: BC Managed Care – PPO | Attending: Internal Medicine

## 2019-02-04 DIAGNOSIS — Z20822 Contact with and (suspected) exposure to covid-19: Secondary | ICD-10-CM

## 2019-02-05 DIAGNOSIS — Z20828 Contact with and (suspected) exposure to other viral communicable diseases: Secondary | ICD-10-CM | POA: Diagnosis not present

## 2019-02-05 DIAGNOSIS — Z03818 Encounter for observation for suspected exposure to other biological agents ruled out: Secondary | ICD-10-CM | POA: Diagnosis not present

## 2019-02-06 LAB — NOVEL CORONAVIRUS, NAA: SARS-CoV-2, NAA: NOT DETECTED

## 2019-02-09 DIAGNOSIS — J3081 Allergic rhinitis due to animal (cat) (dog) hair and dander: Secondary | ICD-10-CM | POA: Diagnosis not present

## 2019-02-09 DIAGNOSIS — J301 Allergic rhinitis due to pollen: Secondary | ICD-10-CM | POA: Diagnosis not present

## 2019-02-09 DIAGNOSIS — J3089 Other allergic rhinitis: Secondary | ICD-10-CM | POA: Diagnosis not present

## 2019-02-17 DIAGNOSIS — J301 Allergic rhinitis due to pollen: Secondary | ICD-10-CM | POA: Diagnosis not present

## 2019-02-17 DIAGNOSIS — J3081 Allergic rhinitis due to animal (cat) (dog) hair and dander: Secondary | ICD-10-CM | POA: Diagnosis not present

## 2019-02-17 DIAGNOSIS — J3089 Other allergic rhinitis: Secondary | ICD-10-CM | POA: Diagnosis not present

## 2019-02-24 DIAGNOSIS — J3089 Other allergic rhinitis: Secondary | ICD-10-CM | POA: Diagnosis not present

## 2019-02-24 DIAGNOSIS — J301 Allergic rhinitis due to pollen: Secondary | ICD-10-CM | POA: Diagnosis not present

## 2019-02-24 DIAGNOSIS — J3081 Allergic rhinitis due to animal (cat) (dog) hair and dander: Secondary | ICD-10-CM | POA: Diagnosis not present

## 2019-03-04 DIAGNOSIS — J301 Allergic rhinitis due to pollen: Secondary | ICD-10-CM | POA: Diagnosis not present

## 2019-03-04 DIAGNOSIS — J3089 Other allergic rhinitis: Secondary | ICD-10-CM | POA: Diagnosis not present

## 2019-03-04 DIAGNOSIS — J3081 Allergic rhinitis due to animal (cat) (dog) hair and dander: Secondary | ICD-10-CM | POA: Diagnosis not present

## 2019-03-11 DIAGNOSIS — J3089 Other allergic rhinitis: Secondary | ICD-10-CM | POA: Diagnosis not present

## 2019-03-11 DIAGNOSIS — J3081 Allergic rhinitis due to animal (cat) (dog) hair and dander: Secondary | ICD-10-CM | POA: Diagnosis not present

## 2019-03-11 DIAGNOSIS — J301 Allergic rhinitis due to pollen: Secondary | ICD-10-CM | POA: Diagnosis not present

## 2019-03-14 ENCOUNTER — Other Ambulatory Visit: Payer: Self-pay

## 2019-03-15 ENCOUNTER — Ambulatory Visit (INDEPENDENT_AMBULATORY_CARE_PROVIDER_SITE_OTHER): Payer: BC Managed Care – PPO | Admitting: Obstetrics & Gynecology

## 2019-03-15 ENCOUNTER — Encounter: Payer: Self-pay | Admitting: Obstetrics & Gynecology

## 2019-03-15 VITALS — BP 128/80 | Ht 64.0 in | Wt 202.0 lb

## 2019-03-15 DIAGNOSIS — Z01419 Encounter for gynecological examination (general) (routine) without abnormal findings: Secondary | ICD-10-CM | POA: Diagnosis not present

## 2019-03-15 DIAGNOSIS — R8290 Unspecified abnormal findings in urine: Secondary | ICD-10-CM | POA: Diagnosis not present

## 2019-03-15 DIAGNOSIS — Z90710 Acquired absence of both cervix and uterus: Secondary | ICD-10-CM

## 2019-03-15 DIAGNOSIS — E6609 Other obesity due to excess calories: Secondary | ICD-10-CM

## 2019-03-15 DIAGNOSIS — Z6834 Body mass index (BMI) 34.0-34.9, adult: Secondary | ICD-10-CM

## 2019-03-15 DIAGNOSIS — Z780 Asymptomatic menopausal state: Secondary | ICD-10-CM | POA: Diagnosis not present

## 2019-03-15 DIAGNOSIS — R350 Frequency of micturition: Secondary | ICD-10-CM | POA: Diagnosis not present

## 2019-03-15 LAB — URINALYSIS, COMPLETE W/RFL CULTURE
Bacteria, UA: NONE SEEN /HPF
Bilirubin Urine: NEGATIVE
Glucose, UA: NEGATIVE
Hgb urine dipstick: NEGATIVE
Hyaline Cast: NONE SEEN /LPF
Ketones, ur: NEGATIVE
Leukocyte Esterase: NEGATIVE
Nitrites, Initial: NEGATIVE
Protein, ur: NEGATIVE
RBC / HPF: NONE SEEN /HPF (ref 0–2)
Specific Gravity, Urine: 1.02 (ref 1.001–1.03)
WBC, UA: NONE SEEN /HPF (ref 0–5)
pH: 6.5 (ref 5.0–8.0)

## 2019-03-15 LAB — NO CULTURE INDICATED

## 2019-03-15 NOTE — Patient Instructions (Signed)
1. Well female exam with routine gynecological exam Gynecologic exam s/p LAVH.  No indication to repeat the Pap test this year.  Breast exam normal.  Screening mammogram scheduled this month.  Colonoscopy in 2014.  Health labs with family physician.  2. S/P laparoscopic assisted vaginal hysterectomy (LAVH)  3. Postmenopausal Well on no HRT.  Vitamin D supplements, calcium intake of 1200 mg daily and regular weightbearing physical activities to continue.  Bone Density Normal in 2017.  Will repeat BD at age 89.  53. Abnormal urine odor U/A completely Negative. - Urinalysis,Complete w/RFL Culture  5. Class 1 obesity due to excess calories without serious comorbidity with body mass index (BMI) of 34.0 to 34.9 in adult Will continue on a low Calorie/Carb diet and fitness with Trainer.  Juliann Pulse, it was a pleasure seeing you today!

## 2019-03-15 NOTE — Progress Notes (Signed)
Olivia Carr 05-22-1960 [id number]   History:    59 y.o. G3P2A1L2 Married.  Daughters 47 and 49 yo.  RP:  Established patient presenting for annual gyn exam   HPI: S/P LAVH.  Menopause, well on no hormone replacement therapy.  No pelvic pain.  No pain with intercourse.  Breasts normal.  Body mass index improved to 34.67.  Joined a weight loss program at work x 1 year.  Working with Clinical research associate.  Health labs with Dr. Sharlene Motts.  Colonoscopy December 2019 showed benign polyps, on a 3 yr schedule.  Past medical history,surgical history, family history and social history were all reviewed and documented in the EPIC chart.  Gynecologic History No LMP recorded. Patient has had a hysterectomy.  Obstetric History OB History  Gravida Para Term Preterm AB Living  3 2 2   1 2   SAB TAB Ectopic Multiple Live Births  1       2    # Outcome Date GA Lbr Len/2nd Weight Sex Delivery Anes PTL Lv  3 SAB           2 Term     F Vag-Spont  N LIV  1 Term     F Vag-Spont  N LIV     ROS: A ROS was performed and pertinent positives and negatives are included in the history.  GENERAL: No fevers or chills. HEENT: No change in vision, no earache, sore throat or sinus congestion. NECK: No pain or stiffness. CARDIOVASCULAR: No chest pain or pressure. No palpitations. PULMONARY: No shortness of breath, cough or wheeze. GASTROINTESTINAL: No abdominal pain, nausea, vomiting or diarrhea, melena or bright red blood per rectum. GENITOURINARY: No urinary frequency, urgency, hesitancy or dysuria. MUSCULOSKELETAL: No joint or muscle pain, no back pain, no recent trauma. DERMATOLOGIC: No rash, no itching, no lesions. ENDOCRINE: No polyuria, polydipsia, no heat or cold intolerance. No recent change in weight. HEMATOLOGICAL: No anemia or easy bruising or bleeding. NEUROLOGIC: No headache, seizures, numbness, tingling or weakness. PSYCHIATRIC: No depression, no loss of interest in normal activity or change in sleep pattern.     Exam:   BP 128/80 (BP Location: Right Arm, Patient Position: Sitting, Cuff Size: Normal)   Ht 5\' 4"  (1.626 m)   Wt 202 lb (91.6 kg)   BMI 34.67 kg/m   Body mass index is 34.67 kg/m.  General appearance : Well developed well nourished female. No acute distress HEENT: Eyes: no retinal hemorrhage or exudates,  Neck supple, trachea midline, no carotid bruits, no thyroidmegaly Lungs: Clear to auscultation, no rhonchi or wheezes, or rib retractions  Heart: Regular rate and rhythm, no murmurs or gallops Breast:Examined in sitting and supine position were symmetrical in appearance, no palpable masses or tenderness,  no skin retraction, no nipple inversion, no nipple discharge, no skin discoloration, no axillary or supraclavicular lymphadenopathy Abdomen: no palpable masses or tenderness, no rebound or guarding Extremities: no edema or skin discoloration or tenderness  Pelvic: Vulva: Normal             Vagina: No gross lesions or discharge  Cervix/Uterus absent  Adnexa  Without masses or tenderness  Anus: Normal  U/A Completely Negative   Assessment/Plan:  59 y.o. female for annual exam   1. Well female exam with routine gynecological exam Gynecologic exam s/p LAVH.  No indication to repeat the Pap test this year.  Breast exam normal.  Screening mammogram scheduled this month.  Colonoscopy in 2014.  Health labs with family physician.  2. S/P laparoscopic assisted vaginal hysterectomy (LAVH)  3. Postmenopausal Well on no HRT.  Vitamin D supplements, calcium intake of 1200 mg daily and regular weightbearing physical activities to continue.  Bone Density Normal in 2017.  Will repeat BD at age 61.  20. Abnormal urine odor U/A completely Negative. - Urinalysis,Complete w/RFL Culture  5. Class 1 obesity due to excess calories without serious comorbidity with body mass index (BMI) of 34.0 to 34.9 in adult Will continue on a low Calorie/Carb diet and fitness with Trainer.  Princess Bruins MD, 8:27 AM 03/15/2019

## 2019-03-16 DIAGNOSIS — J301 Allergic rhinitis due to pollen: Secondary | ICD-10-CM | POA: Diagnosis not present

## 2019-03-16 DIAGNOSIS — J3081 Allergic rhinitis due to animal (cat) (dog) hair and dander: Secondary | ICD-10-CM | POA: Diagnosis not present

## 2019-03-16 DIAGNOSIS — J3089 Other allergic rhinitis: Secondary | ICD-10-CM | POA: Diagnosis not present

## 2019-03-18 ENCOUNTER — Ambulatory Visit: Payer: BC Managed Care – PPO

## 2019-03-25 DIAGNOSIS — J3089 Other allergic rhinitis: Secondary | ICD-10-CM | POA: Diagnosis not present

## 2019-03-25 DIAGNOSIS — J3081 Allergic rhinitis due to animal (cat) (dog) hair and dander: Secondary | ICD-10-CM | POA: Diagnosis not present

## 2019-03-25 DIAGNOSIS — J301 Allergic rhinitis due to pollen: Secondary | ICD-10-CM | POA: Diagnosis not present

## 2019-03-28 ENCOUNTER — Other Ambulatory Visit: Payer: Self-pay

## 2019-03-28 ENCOUNTER — Ambulatory Visit
Admission: RE | Admit: 2019-03-28 | Discharge: 2019-03-28 | Disposition: A | Discharge status: Home / Self Care | Payer: BC Managed Care – PPO | Source: Ambulatory Visit | Attending: Family Medicine | Admitting: Family Medicine

## 2019-03-28 DIAGNOSIS — Z1231 Encounter for screening mammogram for malignant neoplasm of breast: Secondary | ICD-10-CM

## 2019-04-04 DIAGNOSIS — J3089 Other allergic rhinitis: Secondary | ICD-10-CM | POA: Diagnosis not present

## 2019-04-04 DIAGNOSIS — J3081 Allergic rhinitis due to animal (cat) (dog) hair and dander: Secondary | ICD-10-CM | POA: Diagnosis not present

## 2019-04-04 DIAGNOSIS — J301 Allergic rhinitis due to pollen: Secondary | ICD-10-CM | POA: Diagnosis not present

## 2019-04-15 DIAGNOSIS — J3081 Allergic rhinitis due to animal (cat) (dog) hair and dander: Secondary | ICD-10-CM | POA: Diagnosis not present

## 2019-04-15 DIAGNOSIS — J301 Allergic rhinitis due to pollen: Secondary | ICD-10-CM | POA: Diagnosis not present

## 2019-04-15 DIAGNOSIS — J3089 Other allergic rhinitis: Secondary | ICD-10-CM | POA: Diagnosis not present

## 2019-04-22 DIAGNOSIS — J301 Allergic rhinitis due to pollen: Secondary | ICD-10-CM | POA: Diagnosis not present

## 2019-04-22 DIAGNOSIS — J3089 Other allergic rhinitis: Secondary | ICD-10-CM | POA: Diagnosis not present

## 2019-04-22 DIAGNOSIS — J3081 Allergic rhinitis due to animal (cat) (dog) hair and dander: Secondary | ICD-10-CM | POA: Diagnosis not present

## 2019-04-28 DIAGNOSIS — J301 Allergic rhinitis due to pollen: Secondary | ICD-10-CM | POA: Diagnosis not present

## 2019-04-28 DIAGNOSIS — J3089 Other allergic rhinitis: Secondary | ICD-10-CM | POA: Diagnosis not present

## 2019-04-28 DIAGNOSIS — J3081 Allergic rhinitis due to animal (cat) (dog) hair and dander: Secondary | ICD-10-CM | POA: Diagnosis not present

## 2019-05-05 DIAGNOSIS — J3081 Allergic rhinitis due to animal (cat) (dog) hair and dander: Secondary | ICD-10-CM | POA: Diagnosis not present

## 2019-05-05 DIAGNOSIS — J3089 Other allergic rhinitis: Secondary | ICD-10-CM | POA: Diagnosis not present

## 2019-05-05 DIAGNOSIS — J301 Allergic rhinitis due to pollen: Secondary | ICD-10-CM | POA: Diagnosis not present

## 2019-05-11 DIAGNOSIS — J3081 Allergic rhinitis due to animal (cat) (dog) hair and dander: Secondary | ICD-10-CM | POA: Diagnosis not present

## 2019-05-11 DIAGNOSIS — J3089 Other allergic rhinitis: Secondary | ICD-10-CM | POA: Diagnosis not present

## 2019-05-11 DIAGNOSIS — J301 Allergic rhinitis due to pollen: Secondary | ICD-10-CM | POA: Diagnosis not present

## 2019-05-16 DIAGNOSIS — J301 Allergic rhinitis due to pollen: Secondary | ICD-10-CM | POA: Diagnosis not present

## 2019-05-16 DIAGNOSIS — J3089 Other allergic rhinitis: Secondary | ICD-10-CM | POA: Diagnosis not present

## 2019-05-16 DIAGNOSIS — J3081 Allergic rhinitis due to animal (cat) (dog) hair and dander: Secondary | ICD-10-CM | POA: Diagnosis not present

## 2019-05-23 DIAGNOSIS — L918 Other hypertrophic disorders of the skin: Secondary | ICD-10-CM | POA: Diagnosis not present

## 2019-05-23 DIAGNOSIS — L821 Other seborrheic keratosis: Secondary | ICD-10-CM | POA: Diagnosis not present

## 2019-05-26 DIAGNOSIS — J3081 Allergic rhinitis due to animal (cat) (dog) hair and dander: Secondary | ICD-10-CM | POA: Diagnosis not present

## 2019-05-26 DIAGNOSIS — J3089 Other allergic rhinitis: Secondary | ICD-10-CM | POA: Diagnosis not present

## 2019-05-26 DIAGNOSIS — J301 Allergic rhinitis due to pollen: Secondary | ICD-10-CM | POA: Diagnosis not present

## 2019-05-27 DIAGNOSIS — J301 Allergic rhinitis due to pollen: Secondary | ICD-10-CM | POA: Diagnosis not present

## 2019-05-30 DIAGNOSIS — J3089 Other allergic rhinitis: Secondary | ICD-10-CM | POA: Diagnosis not present

## 2019-05-30 DIAGNOSIS — J3081 Allergic rhinitis due to animal (cat) (dog) hair and dander: Secondary | ICD-10-CM | POA: Diagnosis not present

## 2019-06-01 DIAGNOSIS — J3089 Other allergic rhinitis: Secondary | ICD-10-CM | POA: Diagnosis not present

## 2019-06-01 DIAGNOSIS — J301 Allergic rhinitis due to pollen: Secondary | ICD-10-CM | POA: Diagnosis not present

## 2019-06-16 DIAGNOSIS — J3081 Allergic rhinitis due to animal (cat) (dog) hair and dander: Secondary | ICD-10-CM | POA: Diagnosis not present

## 2019-06-16 DIAGNOSIS — J301 Allergic rhinitis due to pollen: Secondary | ICD-10-CM | POA: Diagnosis not present

## 2019-06-16 DIAGNOSIS — J3089 Other allergic rhinitis: Secondary | ICD-10-CM | POA: Diagnosis not present

## 2019-06-20 DIAGNOSIS — J301 Allergic rhinitis due to pollen: Secondary | ICD-10-CM | POA: Diagnosis not present

## 2019-06-20 DIAGNOSIS — J3081 Allergic rhinitis due to animal (cat) (dog) hair and dander: Secondary | ICD-10-CM | POA: Diagnosis not present

## 2019-06-20 DIAGNOSIS — J3089 Other allergic rhinitis: Secondary | ICD-10-CM | POA: Diagnosis not present

## 2019-06-24 DIAGNOSIS — J3081 Allergic rhinitis due to animal (cat) (dog) hair and dander: Secondary | ICD-10-CM | POA: Diagnosis not present

## 2019-06-24 DIAGNOSIS — J3089 Other allergic rhinitis: Secondary | ICD-10-CM | POA: Diagnosis not present

## 2019-06-24 DIAGNOSIS — J301 Allergic rhinitis due to pollen: Secondary | ICD-10-CM | POA: Diagnosis not present

## 2019-06-29 DIAGNOSIS — J3089 Other allergic rhinitis: Secondary | ICD-10-CM | POA: Diagnosis not present

## 2019-06-29 DIAGNOSIS — J301 Allergic rhinitis due to pollen: Secondary | ICD-10-CM | POA: Diagnosis not present

## 2019-06-29 DIAGNOSIS — J3081 Allergic rhinitis due to animal (cat) (dog) hair and dander: Secondary | ICD-10-CM | POA: Diagnosis not present

## 2019-07-01 DIAGNOSIS — J3089 Other allergic rhinitis: Secondary | ICD-10-CM | POA: Diagnosis not present

## 2019-07-01 DIAGNOSIS — J301 Allergic rhinitis due to pollen: Secondary | ICD-10-CM | POA: Diagnosis not present

## 2019-07-01 DIAGNOSIS — J3081 Allergic rhinitis due to animal (cat) (dog) hair and dander: Secondary | ICD-10-CM | POA: Diagnosis not present

## 2019-07-08 DIAGNOSIS — J3089 Other allergic rhinitis: Secondary | ICD-10-CM | POA: Diagnosis not present

## 2019-07-08 DIAGNOSIS — J301 Allergic rhinitis due to pollen: Secondary | ICD-10-CM | POA: Diagnosis not present

## 2019-07-13 DIAGNOSIS — J301 Allergic rhinitis due to pollen: Secondary | ICD-10-CM | POA: Diagnosis not present

## 2019-07-13 DIAGNOSIS — J4520 Mild intermittent asthma, uncomplicated: Secondary | ICD-10-CM | POA: Diagnosis not present

## 2019-07-13 DIAGNOSIS — J3081 Allergic rhinitis due to animal (cat) (dog) hair and dander: Secondary | ICD-10-CM | POA: Diagnosis not present

## 2019-07-13 DIAGNOSIS — J3089 Other allergic rhinitis: Secondary | ICD-10-CM | POA: Diagnosis not present

## 2019-07-21 DIAGNOSIS — J3089 Other allergic rhinitis: Secondary | ICD-10-CM | POA: Diagnosis not present

## 2019-07-21 DIAGNOSIS — J3081 Allergic rhinitis due to animal (cat) (dog) hair and dander: Secondary | ICD-10-CM | POA: Diagnosis not present

## 2019-07-21 DIAGNOSIS — J301 Allergic rhinitis due to pollen: Secondary | ICD-10-CM | POA: Diagnosis not present

## 2019-07-27 DIAGNOSIS — J301 Allergic rhinitis due to pollen: Secondary | ICD-10-CM | POA: Diagnosis not present

## 2019-07-27 DIAGNOSIS — J3081 Allergic rhinitis due to animal (cat) (dog) hair and dander: Secondary | ICD-10-CM | POA: Diagnosis not present

## 2019-07-27 DIAGNOSIS — J3089 Other allergic rhinitis: Secondary | ICD-10-CM | POA: Diagnosis not present

## 2019-08-05 DIAGNOSIS — J3089 Other allergic rhinitis: Secondary | ICD-10-CM | POA: Diagnosis not present

## 2019-08-05 DIAGNOSIS — J3081 Allergic rhinitis due to animal (cat) (dog) hair and dander: Secondary | ICD-10-CM | POA: Diagnosis not present

## 2019-08-05 DIAGNOSIS — J301 Allergic rhinitis due to pollen: Secondary | ICD-10-CM | POA: Diagnosis not present

## 2019-08-12 DIAGNOSIS — J3089 Other allergic rhinitis: Secondary | ICD-10-CM | POA: Diagnosis not present

## 2019-08-12 DIAGNOSIS — J301 Allergic rhinitis due to pollen: Secondary | ICD-10-CM | POA: Diagnosis not present

## 2019-08-12 DIAGNOSIS — J3081 Allergic rhinitis due to animal (cat) (dog) hair and dander: Secondary | ICD-10-CM | POA: Diagnosis not present

## 2019-08-16 DIAGNOSIS — J3089 Other allergic rhinitis: Secondary | ICD-10-CM | POA: Diagnosis not present

## 2019-08-16 DIAGNOSIS — J3081 Allergic rhinitis due to animal (cat) (dog) hair and dander: Secondary | ICD-10-CM | POA: Diagnosis not present

## 2019-08-16 DIAGNOSIS — J301 Allergic rhinitis due to pollen: Secondary | ICD-10-CM | POA: Diagnosis not present

## 2019-08-20 ENCOUNTER — Other Ambulatory Visit: Payer: Self-pay

## 2019-08-20 ENCOUNTER — Emergency Department (HOSPITAL_COMMUNITY): Payer: BC Managed Care – PPO

## 2019-08-20 ENCOUNTER — Encounter (HOSPITAL_COMMUNITY): Payer: Self-pay

## 2019-08-20 ENCOUNTER — Emergency Department (HOSPITAL_COMMUNITY)
Admission: EM | Admit: 2019-08-20 | Discharge: 2019-08-20 | Disposition: A | Discharge status: Home / Self Care | Payer: BC Managed Care – PPO | Attending: Emergency Medicine | Admitting: Emergency Medicine

## 2019-08-20 DIAGNOSIS — J45901 Unspecified asthma with (acute) exacerbation: Secondary | ICD-10-CM | POA: Insufficient documentation

## 2019-08-20 DIAGNOSIS — S53105A Unspecified dislocation of left ulnohumeral joint, initial encounter: Secondary | ICD-10-CM | POA: Diagnosis not present

## 2019-08-20 DIAGNOSIS — Z7951 Long term (current) use of inhaled steroids: Secondary | ICD-10-CM | POA: Diagnosis not present

## 2019-08-20 DIAGNOSIS — S42402A Unspecified fracture of lower end of left humerus, initial encounter for closed fracture: Secondary | ICD-10-CM

## 2019-08-20 DIAGNOSIS — Y999 Unspecified external cause status: Secondary | ICD-10-CM | POA: Insufficient documentation

## 2019-08-20 DIAGNOSIS — Y939 Activity, unspecified: Secondary | ICD-10-CM | POA: Diagnosis not present

## 2019-08-20 DIAGNOSIS — Y929 Unspecified place or not applicable: Secondary | ICD-10-CM | POA: Insufficient documentation

## 2019-08-20 DIAGNOSIS — W19XXXA Unspecified fall, initial encounter: Secondary | ICD-10-CM | POA: Diagnosis not present

## 2019-08-20 DIAGNOSIS — Z20822 Contact with and (suspected) exposure to covid-19: Secondary | ICD-10-CM | POA: Diagnosis not present

## 2019-08-20 DIAGNOSIS — I10 Essential (primary) hypertension: Secondary | ICD-10-CM | POA: Diagnosis not present

## 2019-08-20 DIAGNOSIS — Z79899 Other long term (current) drug therapy: Secondary | ICD-10-CM | POA: Diagnosis not present

## 2019-08-20 DIAGNOSIS — S52122A Displaced fracture of head of left radius, initial encounter for closed fracture: Secondary | ICD-10-CM | POA: Diagnosis not present

## 2019-08-20 DIAGNOSIS — S59902A Unspecified injury of left elbow, initial encounter: Secondary | ICD-10-CM | POA: Diagnosis not present

## 2019-08-20 DIAGNOSIS — S52122D Displaced fracture of head of left radius, subsequent encounter for closed fracture with routine healing: Secondary | ICD-10-CM | POA: Diagnosis not present

## 2019-08-20 LAB — CBC
HCT: 38.2 % (ref 36.0–46.0)
Hemoglobin: 11.3 g/dL — ABNORMAL LOW (ref 12.0–15.0)
MCH: 22.7 pg — ABNORMAL LOW (ref 26.0–34.0)
MCHC: 29.6 g/dL — ABNORMAL LOW (ref 30.0–36.0)
MCV: 76.7 fL — ABNORMAL LOW (ref 80.0–100.0)
Platelets: 284 10*3/uL (ref 150–400)
RBC: 4.98 MIL/uL (ref 3.87–5.11)
RDW: 20.7 % — ABNORMAL HIGH (ref 11.5–15.5)
WBC: 7.5 10*3/uL (ref 4.0–10.5)
nRBC: 0 % (ref 0.0–0.2)

## 2019-08-20 LAB — BASIC METABOLIC PANEL
Anion gap: 9 (ref 5–15)
BUN: 18 mg/dL (ref 6–20)
CO2: 22 mmol/L (ref 22–32)
Calcium: 9.1 mg/dL (ref 8.9–10.3)
Chloride: 109 mmol/L (ref 98–111)
Creatinine, Ser: 0.9 mg/dL (ref 0.44–1.00)
GFR calc Af Amer: >60 mL/min (ref >60)
GFR calc non Af Amer: >60 mL/min (ref >60)
Glucose, Bld: 149 mg/dL — ABNORMAL HIGH (ref 70–99)
Potassium: 4.3 mmol/L (ref 3.5–5.1)
Sodium: 140 mmol/L (ref 135–145)

## 2019-08-20 LAB — SARS CORONAVIRUS 2 BY RT PCR (HOSPITAL ORDER, PERFORMED IN ~~LOC~~ HOSPITAL LAB): SARS Coronavirus 2: NEGATIVE

## 2019-08-20 MED ORDER — METHOCARBAMOL 500 MG PO TABS: 500.0000 mg | ORAL_TABLET | Freq: Four times a day (QID) | ORAL | 0 refills | Status: DC | PRN

## 2019-08-20 MED ORDER — PROPOFOL 10 MG/ML IV BOLUS
INTRAVENOUS | Status: AC | PRN
  Administered 2019-08-20 (×3): 50 mg via INTRAVENOUS

## 2019-08-20 MED ORDER — OXYCODONE-ACETAMINOPHEN 5-325 MG PO TABS
1.0000 | ORAL_TABLET | Freq: Once | ORAL | Status: AC
  Administered 2019-08-20: 1 via ORAL
  Filled 2019-08-20: qty 1

## 2019-08-20 MED ORDER — MIDAZOLAM HCL 2 MG/2ML IJ SOLN
4.0000 mg | Freq: Once | INTRAMUSCULAR | Status: DC
  Filled 2019-08-20: qty 4

## 2019-08-20 MED ORDER — MIDAZOLAM HCL 2 MG/2ML IJ SOLN
INTRAMUSCULAR | Status: AC | PRN
  Administered 2019-08-20 (×2): 2 mg via INTRAVENOUS

## 2019-08-20 MED ORDER — FENTANYL CITRATE (PF) 100 MCG/2ML IJ SOLN
50.0000 ug | Freq: Once | INTRAMUSCULAR | Status: AC
  Administered 2019-08-20: 50 ug via INTRAVENOUS
  Filled 2019-08-20: qty 2

## 2019-08-20 MED ORDER — ONDANSETRON HCL 4 MG/2ML IJ SOLN
4.0000 mg | Freq: Once | INTRAMUSCULAR | Status: AC
  Administered 2019-08-20: 4 mg via INTRAVENOUS
  Filled 2019-08-20: qty 2

## 2019-08-20 MED ORDER — FENTANYL CITRATE (PF) 100 MCG/2ML IJ SOLN
INTRAMUSCULAR | Status: AC | PRN
  Administered 2019-08-20: 50 ug via INTRAVENOUS

## 2019-08-20 MED ORDER — PROPOFOL 10 MG/ML IV BOLUS
0.5000 mg/kg | Freq: Once | INTRAVENOUS | Status: DC
  Filled 2019-08-20: qty 20

## 2019-08-20 MED ORDER — FENTANYL CITRATE (PF) 100 MCG/2ML IJ SOLN
100.0000 ug | Freq: Once | INTRAMUSCULAR | Status: DC
  Filled 2019-08-20: qty 2

## 2019-08-20 MED ORDER — HYDROCODONE-ACETAMINOPHEN 5-325 MG PO TABS: 1.0000 | ORAL_TABLET | Freq: Three times a day (TID) | ORAL | 0 refills | Status: DC | PRN

## 2019-08-20 MED ORDER — KETOROLAC TROMETHAMINE 10 MG PO TABS
10.0000 mg | ORAL_TABLET | Freq: Four times a day (QID) | ORAL | 0 refills | Status: DC | PRN
Start: 2019-08-20 — End: 2019-08-24

## 2019-08-20 MED ORDER — ONDANSETRON 4 MG PO TBDP
4.0000 mg | ORAL_TABLET | Freq: Three times a day (TID) | ORAL | 0 refills | Status: DC | PRN
Start: 2019-08-20 — End: 2019-08-24

## 2019-08-20 NOTE — Procedures (Signed)
Clinician: Jari Pigg, PA-C  Procedure: Closed reduction left elbow fracture dislocation  Complications: none  Medications: 4 mg Versed, 100 mcg of fentanyl  Description:  59 year old left-hand-dominant female sustained a ground-level fall with resultant left elbow fracture dislocation with radial head fracture and likely coronoid fracture.  Initial reduction attempt was made in the ED.  Persistent dislocation noted.  Discuss with patient need for reattempt reduction she agreed to proceed with the second attempt in the emergency department.  Informed consent was obtained.  Conscious sedation under the guidance of the ED physician was performed utilizing Versed and fentanyl.  Versed was given in 2 mg increments until a total of 4mg  was administered and fentanyl was given in 50 mcg increments until 100 mcg was administered.  Once adequate effect was achieved gentle longitudinal traction was applied to the forearm with counter traction to the upper arm.  Arm was flexed and supinated and a little bit of medial translation of the distal humerus resulted in palpable and audible clunk indicating successful reduction.  Patient was then splinted in approximately 90 degrees of elbow flexion with neutral forearm position with plaster.  Patient tolerated the procedure well.  Reduction was confirmed with x-ray and patient will be sent for CT for further evaluation and surgical planning.  At the conclusion of the procedure radial, ulnar, median nerve motor and sensory functions were intact palpable radial pulse was appreciated extremities warm with good color distally  Patient tolerated the procedure without any complications  Sling was applied  Patient will likely be discharged from the emergency department and will follow up in the office in 5 to 7 days.  We anticipate return to the OR in about a week's time for either ORIF of the radial head or radial head arthroplasty and repair of other structures as  indicated based off the CT and intraoperative findings.   Jari Pigg, PA-C [phone] (C) 08/20/2019, 10:17 PM  [facility] [place] [address] [phone] (F)

## 2019-08-20 NOTE — Consult Note (Addendum)
Orthopaedic Trauma Service   Pt seen and examined Full consult to follow  59 year old left-hand-dominant female  Ground level fall at home earlier this pm  Found to have a left elbow fracture dislocation with coronoid process fracture, radial head fracture Attempt at reduction was performed in the ED.  Follow-up films showed incomplete reduction.  Repeat reduction performed by myself in the emergency department with EDP supervision (of Versed and fentanyl total of 4 mg of Versed and 100 mcg of fentanyl.  Versed given in 2 mg increments and fentanyl given in 50 mcg increments)  Gen: Very pleasant white female, no acute distress Left upper extremity: Diminished thumb motion notably with IP extension.     ulnar, median nerve motor and sensory functions intact, radial nerve sensation intact    Extremity is warm    Swelling is well controlled    + Radial pulse    No tenderness along clavicle or proximal humerus    No pain with passive stretching of digits    Soft tissue along elbow is in very good condition with minimal swelling no fracture blisters identified     Post reduction examination notable for improved from motion with maintained ulnar, median and radial nerve motor and sensory functions.    Brisk radial pulse noted  Postreduction x-rays confirm concentric reduction of the ulnohumeral joint.  Radial head fracture identified as it is coronoid fracture  CT scan pending  Assessment and plan   59 year old left-hand-dominant female fall with left elbow fracture dislocation  Reduction performed in the emergency department with success Posterior long-arm splint applied, sling applied.  Sling is part of the cast is remain on at all times Digit motion is encouraged as is aggressive ice and elevation to help with swelling control  Patient will need surgical intervention to address her elbow fracture dislocation.  CT scan will help quantify the size of her coronoid fragment as well as  characterize the pattern of her radial head fracture.  She may require ORIF of her radial head versus radial head arthroplasty.  If the coronoid fragment is large enough this will also need to be repaired as well along with ligamentous repair of her elbow  Patient can be discharged from the emergency department once her CT scan has been completed  Prescriptions will be sent to the [facility] on [address] as this is a 24-hour pharmacy in her home pharmacy is closed   Norco, robaxin, ketorolac and zofran   She will follow-up with [place] in the office on 08/24/2019.  Hopeful to get her on the schedule for Friday, 08/26/2019.  Would anticipate possible outpatient procedure versus overnight stay  Jari Pigg, PA-C [phone] (C) 08/20/2019, 7:09 PM  [facility] [place] [phone] (F)

## 2019-08-20 NOTE — ED Notes (Signed)
Paged ortho tech per [employer]

## 2019-08-20 NOTE — Discharge Instructions (Signed)
Orthopaedic Discharge instructions  You have sustained a Left elbow fracture dislocation     You broke your coronoid process and your radial head along with dislocating your elbow     This will need to be fixed with surgery to restore stability and function      You were treated in the ED with a closed reduction and splinting of you elbow  Continue with splint and sling at all times.  Sling is part of the splint  Do not get splint wet  Do Not remove splint   Ice and elevate you arm to help with swelling and pain.  Elevate hand above elbow and elbow above heart to effectively control swelling.  It is ok to be up moving around though   Do not lift with you left arm   Ok to move fingers to help with swelling as well   Call office with any questions should they arise before your appointment [phone]  It is possible that we may be able to get you on the surgery schedule for 08/26/2019

## 2019-08-20 NOTE — ED Notes (Signed)
Verbal order from Orthopedic Provider Eddie Dibbles, K)

## 2019-08-20 NOTE — ED Provider Notes (Signed)
[facility] EMERGENCY DEPARTMENT Provider Note   CSN: [id number] Arrival date & time: 08/20/19  1200     History Chief Complaint  Patient presents with  . Arm Injury    Olivia Carr is a 59 y.o. female.  HPI Patient presents after a fall.  States she lost her balance and fell on her left elbow.  Pain and deformity.  No other injury.  No head or neck pain.  Not on anticoagulation.  No laceration.  Patient states she is eager to go home because her grandchild is coming into town.  She is left-handed.    Past Medical History:  Diagnosis Date  . Allergic rhinitis    sees Dr. Donneta Romberg   . Asthma    sees Dr. Donneta Romberg   . 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 DUB (dysfunctional uterine bleeding)   . GERD (gastroesophageal reflux disease)   . History of bone density study 03-22-13   normal   . Hypertension   . NSVD (normal spontaneous vaginal delivery)    X2  . Pneumonia   . Routine gynecological examination    sees Dr. Uvaldo Rising     Patient Active Problem List   Diagnosis Date Noted  . Coccydynia 03/03/2014  . Anxiety 01/19/2013  . Perimenopause 01/19/2012  . URI 04/06/2010  . ASTHMA, WITH ACUTE EXACERBATION 04/06/2010  . BENIGN NEOPLASM OF PINEAL GLAND 09/24/2009  . GASTROENTERITIS 07/13/2009  . SORE THROAT 07/05/2009  . FEVER UNSPECIFIED 07/05/2009  . ASTHMA 01/22/2008  . HYPERTENSION 10/12/2006  . ALLERGIC RHINITIS 10/12/2006  . GERD 10/08/2006  . PNEUMONIA, HX OF 10/08/2006    Past Surgical History:  Procedure Laterality Date  . ABDOMINAL HYSTERECTOMY     [facility]  . ANTERIOR CRUCIATE LIGAMENT REPAIR     right knee with medial and lateral meniscectomies 09/04/08  . COLONOSCOPY  12/2017   per Dr. Mary Sella [facility]), precancerous polyps, repeat in 3 yrs   . DILATION AND CURETTAGE OF UTERUS     x 2  . KNEE ARTHROSCOPY Left 2018  . MYOMECTOMY ABDOMINAL APPROACH    . TONSILLECTOMY     . WISDOM TOOTH EXTRACTION       OB History    Gravida  3   Para  2   Term  2   Preterm      AB  1   Living  2     SAB  1   TAB      Ectopic      Multiple      Live Births  2           Family History  Problem Relation Age of Onset  . Hypertension Mother   . Cancer Father 37       PANCREATIC  . Other Other        bowel disease  . Diabetes Other   . Hypertension Other   . Coronary artery disease Other   . Heart disease Maternal Grandmother   . Cancer Maternal Grandfather        COLON  . Heart disease Paternal Grandmother   . Heart disease Paternal Grandfather   . Immunocompromised Brother   . Breast cancer Neg Hx     Social History   Tobacco Use  . Smoking status: Never Smoker  . Smokeless tobacco: Never Used  Vaping Use  . Vaping Use: Never used  Substance Use  Topics  . Alcohol use: Yes    Alcohol/week: 7.0 standard drinks    Types: 7 Standard drinks or equivalent per week    Comment: glass of wine each day  . Drug use: No    Home Medications Prior to Admission medications   Medication Sig Start Date End Date Taking? Authorizing Provider  albuterol (VENTOLIN HFA) 108 (90 Base) MCG/ACT inhaler Inhale 1-2 puffs into the lungs every 6 (six) hours as needed for wheezing or shortness of breath. 05/26/19  Yes [provider]  Calcium Carbonate-Vit D-Min (CALCIUM 1200 PO) Take 1 tablet by mouth every evening.    Yes [provider]  cetirizine (ZYRTEC) 10 MG tablet Take 10 mg by mouth daily.     Yes [provider]  esomeprazole (NEXIUM) 40 MG capsule Take 1 capsule (40 mg total) by mouth daily. 09/20/18  Yes Laurey Morale, MD  fluticasone (FLONASE) 50 MCG/ACT nasal spray USE 2 SPRAYS IN EACH       NOSTRIL DAILY Patient taking differently: Place 2 sprays into both nostrils daily. USE 2 SPRAYS IN [facility]       NOSTRIL DAILY 09/20/18  Yes Laurey Morale, MD  metoprolol succinate (TOPROL-XL) 25 MG 24 hr tablet TAKE 1 TABLET  DAILY Patient taking differently: Take 25 mg by mouth daily. TAKE 1 TABLET DAILY 09/20/18  Yes Laurey Morale, MD  montelukast (SINGULAIR) 10 MG tablet Take 1 tablet (10 mg total) by mouth at bedtime. 09/20/18  Yes Laurey Morale, MD  potassium chloride (KLOR-CON M10) 10 MEQ tablet TAKE 1 TABLET DAILY Patient taking differently: Take 10 mEq by mouth daily. TAKE 1 TABLET DAILY 09/20/18  Yes Laurey Morale, MD  levalbuterol [facility] HFA) 45 MCG/ACT inhaler Inhale 1-2 puffs into the lungs every 4 (four) hours as needed for wheezing or shortness of breath. Patient not taking: Reported on 08/20/2019 11/11/10   Laurey Morale, MD    Allergies    Nitrofurantoin, Prochlorperazine edisylate, Sulfa antibiotics, and Sulfonamide derivatives  Review of Systems   Review of Systems  Constitutional: Negative for appetite change.  Respiratory: Negative for shortness of breath.   Cardiovascular: Negative for chest pain.  Gastrointestinal: Negative for abdominal pain.  Musculoskeletal:       Left elbow pain and deformity.  Skin: Negative for rash.  Neurological: Negative for weakness and numbness.    Physical Exam Updated Vital Signs BP (!) 131/72   Pulse 52   Temp 98.2 F (36.8 C)   Resp 12   Ht 5\' 5"  (1.651 m)   Wt 88.9 kg   SpO2 100%   BMI 32.62 kg/m   Physical Exam Vitals and nursing note reviewed.  HENT:     Head: Normocephalic.  Eyes:     Pupils: Pupils are equal, round, and reactive to light.  Cardiovascular:     Rate and Rhythm: Regular rhythm.  Chest:     Chest wall: No tenderness.  Abdominal:     Tenderness: There is no abdominal tenderness.  Musculoskeletal:        General: Deformity present.     Comments: Deformity and tenderness to left elbow.  Neurovascular intact in left hand over radial median and ulnar distribution.  Skin intact.  No tenderness over shoulder.  Skin:    General: Skin is warm.     Capillary Refill: Capillary refill takes less than 2 seconds.   Neurological:     Mental Status: She is alert and oriented to person, place,  and time.     ED Results / Procedures / Treatments   Labs (all labs ordered are listed, but only abnormal results are displayed) Labs Reviewed  BASIC METABOLIC PANEL - Abnormal; Notable for the following components:      Result Value   Glucose, Bld 149 (*)    All other components within normal limits  CBC - Abnormal; Notable for the following components:   Hemoglobin 11.3 (*)    MCV 76.7 (*)    MCH 22.7 (*)    MCHC 29.6 (*)    RDW 20.7 (*)    All other components within normal limits  SARS CORONAVIRUS 2 BY RT PCR (HOSPITAL ORDER, [place] [facility])    EKG None  Radiology DG Elbow 2 Views Left  Result Date: 08/20/2019 CLINICAL DATA:  Fall today.  Left elbow deformity. EXAM: LEFT ELBOW - 2 VIEW COMPARISON:  None. FINDINGS: Positioning is limited by the patient's injury. There is complete posterior dislocation of the left elbow with proximal migration of the radius and ulna relative to the distal humerus. There is a comminuted and significantly displaced intra-articular fracture of the radial head. A portion of the articular surface of the radial head is displaced distally and medially. Probable fracture of the coronoid process. No definite humeral fracture. IMPRESSION: Posterior dislocation of the left elbow with comminuted and significantly displaced intra-articular fracture of the radial head and probable fracture of the coronoid process. Electronically Signed   By: Richardean Sale M.D.   On: 08/20/2019 13:22   DG Elbow Complete Left  Result Date: 08/20/2019 CLINICAL DATA:  Post reduction of previously seen elbow fracture/dislocation. EXAM: LEFT ELBOW - COMPLETE 3+ VIEW COMPARISON:  Earlier same day. FINDINGS: Post reduction images demonstrate evidence of patient's known radial head fracture with displaced fragment laterally. Evidence of patient's displaced fragment from presumed  coracoid process fracture. There is moderate improved alignment at the elbow joint, although persistent residual inferior medial displacement of the radial head and suggestion of residual lateral displacement of the ulna. IMPRESSION: Significantly improved alignment at the elbow joint post reduction with residual displacement of the radius and ulna as described. Evidence of patient's known radial head fracture and presumed displaced coracoid fragment. Electronically Signed   By: Marin Olp M.D.   On: 08/20/2019 15:46    Procedures .Ortho Injury Treatment  Date/Time: 08/20/2019 3:04 PM Performed by: Davonna Belling, MD Authorized by: Davonna Belling, MD   Consent:    Consent obtained:  Written   Consent given by:  Patient   Risks discussed:  Fracture, nerve damage, restricted joint movement, vascular damage, irreducible dislocation, recurrent dislocation and stiffness   Alternatives discussed:  No treatment and alternative treatmentInjury location: elbow Location details: left elbow Injury type: fracture-dislocation Pre-procedure neurovascular assessment: neurovascularly intact Pre-procedure distal perfusion: normal Pre-procedure neurological function: normal Pre-procedure range of motion: reduced  Anesthesia: Local anesthesia used: no  Patient sedated: Yes. Refer to sedation procedure documentation for details of sedation. Manipulation performed: yes Skin traction used: no Skeletal traction used: no Reduction successful: yes Immobilization: splint and sling Splint type: long arm Supplies used: Ortho-Glass Post-procedure neurovascular assessment: post-procedure neurovascularly intact Post-procedure distal perfusion: normal Post-procedure neurological function: normal Post-procedure range of motion: improved Patient tolerance: patient tolerated the procedure well with no immediate complications  .Sedation  Date/Time: 08/20/2019 3:05 PM Performed by: Davonna Belling,  MD Authorized by: Davonna Belling, MD   Consent:    Consent obtained:  Written   Consent given by:  Patient   Risks discussed:  Allergic reaction, dysrhythmia, inadequate sedation, nausea, prolonged sedation necessitating reversal, respiratory compromise necessitating ventilatory assistance and intubation and vomiting   Alternatives discussed:  Analgesia without sedation Universal protocol:    Immediately prior to procedure a time out was called: yes     Patient identity confirmation method:  Arm band, verbally with patient and hospital-assigned identification number Pre-sedation assessment:    Time since last food or drink:  4   NPO status caution: unable to specify NPO status     ASA classification: class 2 - patient with mild systemic disease     Neck mobility: normal     Mallampati score:  I - soft palate, uvula, fauces, pillars visible   Pre-sedation assessments completed and reviewed: airway patency, cardiovascular function, hydration status, mental status, nausea/vomiting, pain level, respiratory function and temperature     History of difficult intubation: yes   Immediate pre-procedure details:    Reviewed: vital signs     Verified: bag valve mask available, emergency equipment available, intubation equipment available, IV patency confirmed and oxygen available   Procedure details (see MAR for exact dosages):    Preoxygenation:  Nasal cannula   Sedation:  Propofol   Intended level of sedation: deep   Analgesia:  Fentanyl   Intra-procedure monitoring:  Blood pressure monitoring, cardiac monitor, continuous pulse oximetry, continuous capnometry, frequent LOC assessments and frequent vital sign checks   Intra-procedure events: none     Total Provider sedation time (minutes):  10 Post-procedure details:    Post-sedation assessment completed:  08/20/2019 3:58 PM   Attendance: Constant attendance by certified staff until patient recovered     Recovery: Patient returned to  pre-procedure baseline     Patient is stable for discharge or admission: yes     Patient tolerance:  Tolerated well, no immediate complications   (including critical care time)  Medications Ordered in ED Medications  propofol (DIPRIVAN) 10 mg/mL bolus/IV push 44.5 mg (has no administration in time range)  oxyCODONE-acetaminophen (PERCOCET/ROXICET) 5-325 MG per tablet 1 tablet (1 tablet Oral Given 08/20/19 1214)  fentaNYL (SUBLIMAZE) injection 50 mcg (50 mcg Intravenous Given 08/20/19 1305)  ondansetron (ZOFRAN) injection 4 mg (4 mg Intravenous Given 08/20/19 1305)  propofol (DIPRIVAN) 10 mg/mL bolus/IV push (50 mg Intravenous Given 08/20/19 1445)    ED Course  I have reviewed the triage vital signs and the nursing notes.  Pertinent labs & imaging results that were available during my care of the patient were reviewed by me and considered in my medical decision making (see chart for details).    MDM Rules/Calculators/A&P                          Patient with left elbow fracture dislocation.  Discussed with Dr. Marcelino Scot.  Reduction done in the ER under sedation. Improved alignment.  Will be reviewed by Dr. Marcelino Scot.  Care turned over to Dr. Wilson Singer. Final Clinical Impression(s) / ED Diagnoses Final diagnoses:  Closed fracture dislocation of left elbow, initial encounter    Rx / DC Orders ED Discharge Orders    None       Davonna Belling, MD 08/20/19 1559

## 2019-08-20 NOTE — Sedation Documentation (Signed)
Pt transported to Xray. 

## 2019-08-20 NOTE — Progress Notes (Signed)
Orthopedic Tech Progress Note Patient Details:  Olivia Carr 11-02-1960 [id number]  Ortho Devices Type of Ortho Device: Post (long arm) splint, Sling immobilizer Ortho Device/Splint Location: LUE Ortho Device/Splint Interventions: Ordered, Application, Adjustment   Post Interventions Patient Tolerated: Well Instructions Provided: Care of device, Adjustment of device, Poper ambulation with device   Olivia Carr 08/20/2019, 4:02 PM

## 2019-08-20 NOTE — ED Notes (Signed)
Pt in CT. Not available for assessment

## 2019-08-20 NOTE — Consult Note (Incomplete)
Orthopaedic Trauma Service (OTS) Consult   Patient ID: Olivia Carr MRN: [id number] DOB/AGE: [birth year] 59 y.o.   Reason for Consult:*** Referring Physician: ***   HPI: Olivia Carr is an 59 y.o. female ***  Past Medical History:  Diagnosis Date  . Allergic rhinitis    sees Dr. Donneta Romberg   . Asthma    sees Dr. Donneta Romberg   . 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 DUB (dysfunctional uterine bleeding)   . GERD (gastroesophageal reflux disease)   . History of bone density study 03-22-13   normal   . Hypertension   . NSVD (normal spontaneous vaginal delivery)    X2  . Pneumonia   . Routine gynecological examination    sees Dr. Uvaldo Rising     Past Surgical History:  Procedure Laterality Date  . ABDOMINAL HYSTERECTOMY     LAVH  . ANTERIOR CRUCIATE LIGAMENT REPAIR     right knee with medial and lateral meniscectomies 09/04/08  . COLONOSCOPY  12/2017   per Dr. Mary Sella [facility]), precancerous polyps, repeat in 3 yrs   . DILATION AND CURETTAGE OF UTERUS     x 2  . KNEE ARTHROSCOPY Left 2018  . MYOMECTOMY ABDOMINAL APPROACH    . TONSILLECTOMY    . WISDOM TOOTH EXTRACTION      Family History  Problem Relation Age of Onset  . Hypertension Mother   . Cancer Father 65       PANCREATIC  . Other Other        bowel disease  . Diabetes Other   . Hypertension Other   . Coronary artery disease Other   . Heart disease Maternal Grandmother   . Cancer Maternal Grandfather        COLON  . Heart disease Paternal Grandmother   . Heart disease Paternal Grandfather   . Immunocompromised Brother   . Breast cancer Neg Hx     Social History:  reports that she has never smoked. She has never used smokeless tobacco. She reports current alcohol use of about 7.0 standard drinks of alcohol per week. She reports that she does not use drugs.  Allergies:  Allergies  Allergen Reactions  .  Nitrofurantoin   . Prochlorperazine Edisylate   . Sulfa Antibiotics   . Sulfonamide Derivatives     Medications: {medication reviewed/display:3041432}  Results for orders placed or performed during the hospital encounter of 08/20/19 (from the past 48 hour(s))  SARS Coronavirus 2 by RT PCR (hospital order, performed in [facility] hospital lab) Nasopharyngeal Nasopharyngeal Swab     Status: None   Collection Time: 08/20/19 12:29 PM   Specimen: Nasopharyngeal Swab  Result Value Ref Range   SARS Coronavirus 2 NEGATIVE NEGATIVE    Comment: (NOTE) SARS-CoV-2 target nucleic acids are NOT DETECTED.  The SARS-CoV-2 RNA is generally detectable in upper and lower respiratory specimens during the acute phase of infection. The lowest concentration of SARS-CoV-2 viral copies this assay can detect is 250 copies / mL. A negative result does not preclude SARS-CoV-2 infection and should not be used as the sole basis for treatment or other patient management decisions.  A negative result may occur with improper specimen collection / handling, submission of specimen other than nasopharyngeal swab, presence of viral mutation(s) within the areas targeted by this assay, and inadequate number of viral copies (<250 copies / mL). A negative result  must be combined with clinical observations, patient history, and epidemiological information.  Fact Sheet for Patients:   StrictlyIdeas.no  Fact Sheet for Healthcare Providers: BankingDealers.co.za  This test is not yet approved or  cleared by the [place] FDA and has been authorized for detection and/or diagnosis of SARS-CoV-2 by FDA under an Emergency Use Authorization (EUA).  This EUA will remain in effect (meaning this test can be used) for the duration of the COVID-19 declaration under Section 564(b)(1) of the Act, 21 U.S.C. section 360bbb-3(b)(1), unless the authorization is terminated or revoked  sooner.  Performed at [facility], [place] [address], [place], [place] [address]   Basic metabolic panel     Status: Abnormal   Collection Time: 08/20/19 12:39 PM  Result Value Ref Range   Sodium 140 135 - 145 mmol/L   Potassium 4.3 3.5 - 5.1 mmol/L   Chloride 109 98 - 111 mmol/L   CO2 22 22 - 32 mmol/L   Glucose, Bld 149 (H) 70 - 99 mg/dL    Comment: Glucose reference range applies only to samples taken after fasting for at least 8 hours.   BUN 18 6 - 20 mg/dL   Creatinine, Ser 0.90 0.44 - 1.00 mg/dL   Calcium 9.1 8.9 - 10.3 mg/dL   GFR calc non Af Amer >60 >60 mL/min   GFR calc Af Amer >60 >60 mL/min   Anion gap 9 5 - 15    Comment: Performed at [facility] [address], [place], [place] [address]  CBC     Status: Abnormal   Collection Time: 08/20/19 12:39 PM  Result Value Ref Range   WBC 7.5 4.0 - 10.5 K/uL   RBC 4.98 3.87 - 5.11 MIL/uL   Hemoglobin 11.3 (L) 12.0 - 15.0 g/dL   HCT 38.2 36 - 46 %   MCV 76.7 (L) 80.0 - 100.0 fL   MCH 22.7 (L) 26.0 - 34.0 pg   MCHC 29.6 (L) 30.0 - 36.0 g/dL   RDW 20.7 (H) 11.5 - 15.5 %   Platelets 284 150 - 400 K/uL   nRBC 0.0 0.0 - 0.2 %    Comment: Performed at [facility] [address], [place], [place] [address]    DG Elbow 2 Views Left  Result Date: 08/20/2019 CLINICAL DATA:  Left elbow dislocation, status post reduction EXAM: LEFT ELBOW - 2 VIEW COMPARISON:  08/20/2019 FINDINGS: Frontal and lateral views of the left elbow are obtained. Cast material is in place, which obscures underlying bony detail. There is near anatomic alignment of the left elbow on this exam. The radial head fracture and coronoid process fracture seen on initial evaluation are difficult to appreciate on this exam due to positioning and casting artifact. IMPRESSION: 1. Reduction of prior fracture dislocation, with near anatomic alignment of the left elbow. 2. Suboptimal visualization of the coronoid process and radial head fractures due to  positioning and casting artifact. Electronically Signed   By: Randa Ngo M.D.   On: 08/20/2019 19:10   DG Elbow 2 Views Left  Result Date: 08/20/2019 CLINICAL DATA:  Fall today.  Left elbow deformity. EXAM: LEFT ELBOW - 2 VIEW COMPARISON:  None. FINDINGS: Positioning is limited by the patient's injury. There is complete posterior dislocation of the left elbow with proximal migration of the radius and ulna relative to the distal humerus. There is a comminuted and significantly displaced intra-articular fracture of the radial head. A portion of the articular surface  of the radial head is displaced distally and medially. Probable fracture of the coronoid process. No definite humeral fracture. IMPRESSION: Posterior dislocation of the left elbow with comminuted and significantly displaced intra-articular fracture of the radial head and probable fracture of the coronoid process. Electronically Signed   By: Richardean Sale M.D.   On: 08/20/2019 13:22   DG Elbow Complete Left  Result Date: 08/20/2019 CLINICAL DATA:  Post reduction of previously seen elbow fracture/dislocation. EXAM: LEFT ELBOW - COMPLETE 3+ VIEW COMPARISON:  Earlier same day. FINDINGS: Post reduction images demonstrate evidence of patient's known radial head fracture with displaced fragment laterally. Evidence of patient's displaced fragment from presumed coracoid process fracture. There is moderate improved alignment at the elbow joint, although persistent residual inferior medial displacement of the radial head and suggestion of residual lateral displacement of the ulna. IMPRESSION: Significantly improved alignment at the elbow joint post reduction with residual displacement of the radius and ulna as described. Evidence of patient's known radial head fracture and presumed displaced coracoid fragment. Electronically Signed   By: Marin Olp M.D.   On: 08/20/2019 15:46    ROS Blood pressure (!) 133/75, pulse 65, temperature 98.3 F (36.8  C), temperature source Oral, resp. rate (!) 11, height 5\' 5"  (1.651 m), weight 88.9 kg, SpO2 97 %. Physical Exam        Gait: *** Coordination and balance: ***   Assessment/Plan:  ***  -(ortho injury):  - Pain management:  *** - ABL anemia/Hemodynamics  *** - Medical issues   *** - DVT/PE prophylaxis:  *** - ID:   *** - Metabolic Bone Disease:  *** - Activity:  *** - FEN/GI prophylaxis/Foley/Lines:  *** -Ex-fix/Splint care:  *** - Impediments to fracture healing:  *** - Dispo:  ***  {ORTHOADMISSIONSTATUS:21269}  Weightbearing: {weightbearing/status:21254} {affected extremity :21255} Insicional and dressing care: {Insicional and dressing care:21256} Orthopedic device(s): {CHL orthopedic devices:21267::"None"} Showering: *** VTE prophylaxis: {Type:21257} {Duration:21258} Pain control: *** Follow - up plan: {Follow-up plan:21259} Contact information:  *** MD, *** PA   Jari Pigg, PA-C [phone] (C) 08/20/2019, 7:15 PM  [facility] [place] [address] [phone] Domingo Sep (F)

## 2019-08-20 NOTE — ED Notes (Signed)
Discharge instructionsdiscussed with pt. Pt verbalized understanding with no questions at this time. Pt ambulatory. Pt to go home with spouse at bedside. Follow up outpatient ortho surgery.

## 2019-08-20 NOTE — ED Triage Notes (Signed)
Pt arrives to ED w/ c/o L arm injury after mechanical fall today. Deformity noted in triage. Pt repolrts 10/10 pain.

## 2019-08-22 ENCOUNTER — Other Ambulatory Visit (HOSPITAL_COMMUNITY)
Admission: RE | Admit: 2019-08-22 | Discharge: 2019-08-22 | Disposition: A | Discharge status: Home / Self Care | Payer: BC Managed Care – PPO | Source: Ambulatory Visit | Attending: Orthopaedic Surgery | Admitting: Orthopaedic Surgery

## 2019-08-22 DIAGNOSIS — Z20822 Contact with and (suspected) exposure to covid-19: Secondary | ICD-10-CM | POA: Diagnosis not present

## 2019-08-22 DIAGNOSIS — Z01812 Encounter for preprocedural laboratory examination: Secondary | ICD-10-CM | POA: Insufficient documentation

## 2019-08-22 LAB — SARS CORONAVIRUS 2 (TAT 6-24 HRS): SARS Coronavirus 2: NEGATIVE

## 2019-08-23 ENCOUNTER — Other Ambulatory Visit: Payer: Self-pay

## 2019-08-23 ENCOUNTER — Encounter (HOSPITAL_BASED_OUTPATIENT_CLINIC_OR_DEPARTMENT_OTHER)
Admission: RE | Admit: 2019-08-23 | Discharge: 2019-08-23 | Disposition: A | Discharge status: Home / Self Care | Payer: BC Managed Care – PPO | Source: Ambulatory Visit | Attending: Orthopaedic Surgery | Admitting: Orthopaedic Surgery

## 2019-08-23 ENCOUNTER — Encounter (HOSPITAL_BASED_OUTPATIENT_CLINIC_OR_DEPARTMENT_OTHER): Payer: Self-pay | Admitting: Orthopaedic Surgery

## 2019-08-23 DIAGNOSIS — Z01810 Encounter for preprocedural cardiovascular examination: Secondary | ICD-10-CM | POA: Insufficient documentation

## 2019-08-23 NOTE — Progress Notes (Signed)

## 2019-08-23 NOTE — H&P (Signed)
PREOPERATIVE H&P  Chief Complaint: LEFT ELBOW FRACTURE  HPI: Olivia Carr is a 59 y.o. female who is scheduled for RADIAL HEAD ARTHROPLASTY.   Patient has a past medical history significant for GERD, HTN, asthma.   Patient had a fall on 08/20/2019 where she landed on her left elbow. She had immediate pain. She went to [facility] ED. X-rays showed left elbow fracture dislocation with coronoid process fracture, radial head fracture. Elbow was reduced in the ED. She was placed in a posterior long arm splint and told to follow-up with orthopedics for surgical discussion.   Her symptoms are rated as moderate to severe, and have been worsening.  This is significantly impairing activities of daily living.    Please see clinic note for further details on this patient's care.    She has elected for surgical management.   Past Medical History:  Diagnosis Date  . Allergic rhinitis    sees Dr. Donneta Romberg   . Asthma    sees Dr. Donneta Romberg   . 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 DUB (dysfunctional uterine bleeding)   . GERD (gastroesophageal reflux disease)   . History of bone density study 03-22-13   normal   . Hypertension   . NSVD (normal spontaneous vaginal delivery)    X2  . Pneumonia   . Routine gynecological examination    sees Dr. Uvaldo Rising    Past Surgical History:  Procedure Laterality Date  . ABDOMINAL HYSTERECTOMY     LAVH  . ANTERIOR CRUCIATE LIGAMENT REPAIR     right knee with medial and lateral meniscectomies 09/04/08  . COLONOSCOPY  12/2017   per Dr. Mary Sella [facility]), precancerous polyps, repeat in 3 yrs   . DILATION AND CURETTAGE OF UTERUS     x 2  . KNEE ARTHROSCOPY Left 2018  . MYOMECTOMY ABDOMINAL APPROACH    . TONSILLECTOMY    . WISDOM TOOTH EXTRACTION     Social History   Socioeconomic History  . Marital status: Married    Spouse name: Not on file  . Number of children: Not on file  .  Years of education: Not on file  . Highest education level: Not on file  Occupational History  . Not on file  Tobacco Use  . Smoking status: Never Smoker  . Smokeless tobacco: Never Used  Vaping Use  . Vaping Use: Never used  Substance and Sexual Activity  . Alcohol use: Yes    Alcohol/week: 7.0 standard drinks    Types: 7 Standard drinks or equivalent per week    Comment: glass of wine each day  . Drug use: No  . Sexual activity: Yes    Partners: Male    Birth control/protection: Surgical    Comment: HYST. 1st intercourse- 46, partners- 1  Other Topics Concern  . Not on file  Social History Narrative  . Not on file   Social Determinants of Health   Financial Resource Strain:   . Difficulty of Paying Living Expenses:   Food Insecurity:   . Worried About Charity fundraiser in the Last Year:   . Arboriculturist in the Last Year:   Transportation Needs:   . Film/video editor (Medical):   Marland Kitchen Lack of Transportation (Non-Medical):   Physical Activity:   . Days of Exercise per Week:   . Minutes of Exercise per Session:   Stress:   .  Feeling of Stress :   Social Connections:   . Frequency of Communication with Friends and Family:   . Frequency of Social Gatherings with Friends and Family:   . Attends Religious Services:   . Active Member of Clubs or Organizations:   . Attends Archivist Meetings:   Marland Kitchen Marital Status:    Family History  Problem Relation Age of Onset  . Hypertension Mother   . Cancer Father 63       PANCREATIC  . Other Other        bowel disease  . Diabetes Other   . Hypertension Other   . Coronary artery disease Other   . Heart disease Maternal Grandmother   . Cancer Maternal Grandfather        COLON  . Heart disease Paternal Grandmother   . Heart disease Paternal Grandfather   . Immunocompromised Brother   . Breast cancer Neg Hx    Allergies  Allergen Reactions  . Nitrofurantoin   . Prochlorperazine Edisylate   . Sulfa  Antibiotics   . Sulfonamide Derivatives    Prior to Admission medications   Medication Sig Start Date End Date Taking? Authorizing Provider  Calcium Carbonate-Vit D-Min (CALCIUM 1200 PO) Take 1 tablet by mouth every evening.    Yes [provider]  cetirizine (ZYRTEC) 10 MG tablet Take 10 mg by mouth daily.     Yes [provider]  esomeprazole (NEXIUM) 40 MG capsule Take 1 capsule (40 mg total) by mouth daily. 09/20/18  Yes Laurey Morale, MD  fluticasone (FLONASE) 50 MCG/ACT nasal spray USE 2 SPRAYS IN EACH       NOSTRIL DAILY Patient taking differently: Place 2 sprays into both nostrils daily. USE 2 SPRAYS IN [facility]       NOSTRIL DAILY 09/20/18  Yes Laurey Morale, MD  HYDROcodone-acetaminophen (NORCO) 5-325 MG tablet Take 1-2 tablets by mouth every 8 (eight) hours as needed for moderate pain or severe pain. 08/20/19  Yes Ainsley Spinner, PA-C  methocarbamol (ROBAXIN) 500 MG tablet Take 1 tablet (500 mg total) by mouth every 6 (six) hours as needed for muscle spasms. 08/20/19  Yes Ainsley Spinner, PA-C  metoprolol succinate (TOPROL-XL) 25 MG 24 hr tablet TAKE 1 TABLET DAILY Patient taking differently: Take 25 mg by mouth daily. TAKE 1 TABLET DAILY 09/20/18  Yes Laurey Morale, MD  montelukast (SINGULAIR) 10 MG tablet Take 1 tablet (10 mg total) by mouth at bedtime. 09/20/18  Yes Laurey Morale, MD  potassium chloride (KLOR-CON M10) 10 MEQ tablet TAKE 1 TABLET DAILY Patient taking differently: Take 10 mEq by mouth daily. TAKE 1 TABLET DAILY 09/20/18  Yes Laurey Morale, MD  albuterol (VENTOLIN HFA) 108 (90 Base) MCG/ACT inhaler Inhale 1-2 puffs into the lungs every 6 (six) hours as needed for wheezing or shortness of breath. 05/26/19   [provider]  ketorolac (TORADOL) 10 MG tablet Take 1 tablet (10 mg total) by mouth every 6 (six) hours as needed for moderate pain. 08/20/19   Ainsley Spinner, PA-C  ondansetron (ZOFRAN ODT) 4 MG disintegrating tablet Take 1 tablet (4 mg total) by mouth  every 8 (eight) hours as needed for nausea or vomiting. 08/20/19   Ainsley Spinner, PA-C    ROS: All other systems have been reviewed and were otherwise negative with the exception of those mentioned in the HPI and as above.  Physical Exam: General: Alert, no acute distress Cardiovascular: No pedal edema Respiratory: No cyanosis, no  use of accessory musculature GI: No organomegaly, abdomen is soft and non-tender Skin: No lesions in the area of chief complaint Neurologic: Sensation intact distally Psychiatric: Patient is competent for consent with normal mood and affect Lymphatic: No axillary or cervical lymphadenopathy  MUSCULOSKELETAL:  Left upper extremity: Splint CDI. Skin intact though cannot assess fully beneath splint. Nontender to palpation proximally, with full and painless ROM throughout hand with DPC of 0. Distal motor and sensory function appear to be intact. Warm well perfused hand.   Imaging: CT left elbow shows intra-articular radial head fracture with displacement of articular fragment.   Assessment: LEFT ELBOW FRACTURE/DISLOCATION  Plan: Plan for Procedure(s): RADIAL HEAD ARTHROPLASTY  The risks benefits and alternatives were discussed with the patient including but not limited to the risks of nonoperative treatment, versus surgical intervention including infection, bleeding, nerve injury,  blood clots, cardiopulmonary complications, morbidity, mortality, among others, and they were willing to proceed.   The patient acknowledged the explanation, agreed to proceed with the plan and consent was signed.   Operative Plan: Left elbow radial head replacement with possible ligamentous repair Discharge Medications: Tylenol, Celebrex, Oxycodone, Zofran DVT Prophylaxis: None Physical Therapy: Outpatient OT Special Discharge needs: Splint. Sling.    Ethelda Chick, PA-C  08/23/2019 12:53 PM

## 2019-08-24 ENCOUNTER — Encounter (HOSPITAL_BASED_OUTPATIENT_CLINIC_OR_DEPARTMENT_OTHER): Payer: Self-pay | Admitting: Orthopaedic Surgery

## 2019-08-24 ENCOUNTER — Encounter (HOSPITAL_BASED_OUTPATIENT_CLINIC_OR_DEPARTMENT_OTHER)
Admission: RE | Disposition: A | Discharge status: Home / Self Care | Payer: Self-pay | Source: Home / Self Care | Attending: Orthopaedic Surgery

## 2019-08-24 ENCOUNTER — Ambulatory Visit (HOSPITAL_BASED_OUTPATIENT_CLINIC_OR_DEPARTMENT_OTHER): Payer: BC Managed Care – PPO | Admitting: Anesthesiology

## 2019-08-24 ENCOUNTER — Ambulatory Visit (HOSPITAL_BASED_OUTPATIENT_CLINIC_OR_DEPARTMENT_OTHER)
Admission: RE | Admit: 2019-08-24 | Discharge: 2019-08-24 | Disposition: A | Discharge status: Home / Self Care | Payer: BC Managed Care – PPO | Attending: Orthopaedic Surgery | Admitting: Orthopaedic Surgery

## 2019-08-24 ENCOUNTER — Other Ambulatory Visit: Payer: Self-pay

## 2019-08-24 DIAGNOSIS — G8918 Other acute postprocedural pain: Secondary | ICD-10-CM | POA: Diagnosis not present

## 2019-08-24 DIAGNOSIS — S53432A Radial collateral ligament sprain of left elbow, initial encounter: Secondary | ICD-10-CM | POA: Insufficient documentation

## 2019-08-24 DIAGNOSIS — W19XXXA Unspecified fall, initial encounter: Secondary | ICD-10-CM | POA: Diagnosis not present

## 2019-08-24 DIAGNOSIS — S52122A Displaced fracture of head of left radius, initial encounter for closed fracture: Secondary | ICD-10-CM | POA: Diagnosis not present

## 2019-08-24 DIAGNOSIS — I10 Essential (primary) hypertension: Secondary | ICD-10-CM | POA: Insufficient documentation

## 2019-08-24 DIAGNOSIS — K219 Gastro-esophageal reflux disease without esophagitis: Secondary | ICD-10-CM | POA: Diagnosis not present

## 2019-08-24 DIAGNOSIS — J45901 Unspecified asthma with (acute) exacerbation: Secondary | ICD-10-CM | POA: Diagnosis not present

## 2019-08-24 DIAGNOSIS — S53492A Other sprain of left elbow, initial encounter: Secondary | ICD-10-CM | POA: Diagnosis not present

## 2019-08-24 DIAGNOSIS — S52042A Displaced fracture of coronoid process of left ulna, initial encounter for closed fracture: Secondary | ICD-10-CM | POA: Diagnosis not present

## 2019-08-24 DIAGNOSIS — S53442A Ulnar collateral ligament sprain of left elbow, initial encounter: Secondary | ICD-10-CM | POA: Insufficient documentation

## 2019-08-24 DIAGNOSIS — Z79899 Other long term (current) drug therapy: Secondary | ICD-10-CM | POA: Insufficient documentation

## 2019-08-24 DIAGNOSIS — J45909 Unspecified asthma, uncomplicated: Secondary | ICD-10-CM | POA: Insufficient documentation

## 2019-08-24 HISTORY — PX: RADIAL HEAD ARTHROPLASTY: SHX6044

## 2019-08-24 HISTORY — PX: LIGAMENT REPAIR: SHX5444

## 2019-08-24 SURGERY — ARTHROPLASTY, RADIUS, HEAD
Anesthesia: General | Site: Elbow | Laterality: Left

## 2019-08-24 MED ORDER — OXYCODONE HCL 5 MG/5ML PO SOLN: 5.0000 mg | Freq: Once | ORAL | Status: DC | PRN

## 2019-08-24 MED ORDER — FENTANYL CITRATE (PF) 100 MCG/2ML IJ SOLN
INTRAMUSCULAR | Status: AC
  Filled 2019-08-24: qty 2

## 2019-08-24 MED ORDER — MELOXICAM 7.5 MG PO TABS
7.5000 mg | ORAL_TABLET | Freq: Every day | ORAL | 0 refills | Status: AC
Start: 2019-08-24 — End: 2019-09-23

## 2019-08-24 MED ORDER — VANCOMYCIN HCL 1000 MG IV SOLR
INTRAVENOUS | Status: DC | PRN
  Administered 2019-08-24: 1000 mg via TOPICAL

## 2019-08-24 MED ORDER — ONDANSETRON HCL 4 MG/2ML IJ SOLN: 4.0000 mg | Freq: Four times a day (QID) | INTRAMUSCULAR | Status: DC | PRN

## 2019-08-24 MED ORDER — MIDAZOLAM HCL 2 MG/2ML IJ SOLN
INTRAMUSCULAR | Status: AC
  Filled 2019-08-24: qty 2

## 2019-08-24 MED ORDER — OXYCODONE HCL 5 MG PO TABS: 5.0000 mg | ORAL_TABLET | Freq: Once | ORAL | Status: DC | PRN

## 2019-08-24 MED ORDER — ONDANSETRON HCL 4 MG PO TABS: 4.0000 mg | ORAL_TABLET | Freq: Three times a day (TID) | ORAL | 1 refills | Status: AC | PRN

## 2019-08-24 MED ORDER — FENTANYL CITRATE (PF) 100 MCG/2ML IJ SOLN
100.0000 ug | Freq: Once | INTRAMUSCULAR | Status: AC
  Administered 2019-08-24: 100 ug via INTRAVENOUS

## 2019-08-24 MED ORDER — ONDANSETRON HCL 4 MG/2ML IJ SOLN
INTRAMUSCULAR | Status: AC
  Filled 2019-08-24: qty 2

## 2019-08-24 MED ORDER — KETOROLAC TROMETHAMINE 30 MG/ML IJ SOLN
INTRAMUSCULAR | Status: AC
  Filled 2019-08-24: qty 1

## 2019-08-24 MED ORDER — PROPOFOL 10 MG/ML IV BOLUS
INTRAVENOUS | Status: DC | PRN
  Administered 2019-08-24: 200 mg via INTRAVENOUS

## 2019-08-24 MED ORDER — LIDOCAINE 2% (20 MG/ML) 5 ML SYRINGE
INTRAMUSCULAR | Status: AC
  Filled 2019-08-24: qty 5

## 2019-08-24 MED ORDER — DEXAMETHASONE SODIUM PHOSPHATE 10 MG/ML IJ SOLN
INTRAMUSCULAR | Status: AC
  Filled 2019-08-24: qty 2

## 2019-08-24 MED ORDER — BUPIVACAINE-EPINEPHRINE (PF) 0.5% -1:200000 IJ SOLN
INTRAMUSCULAR | Status: DC | PRN
Start: 2019-08-24 — End: 2019-08-24
  Administered 2019-08-24: 30 mL via PERINEURAL

## 2019-08-24 MED ORDER — LIDOCAINE 2% (20 MG/ML) 5 ML SYRINGE
INTRAMUSCULAR | Status: DC | PRN
  Administered 2019-08-24: 50 mg via INTRAVENOUS

## 2019-08-24 MED ORDER — FENTANYL CITRATE (PF) 100 MCG/2ML IJ SOLN: 25.0000 ug | INTRAMUSCULAR | Status: DC | PRN

## 2019-08-24 MED ORDER — DEXAMETHASONE SODIUM PHOSPHATE 10 MG/ML IJ SOLN
INTRAMUSCULAR | Status: DC | PRN
  Administered 2019-08-24: 10 mg via INTRAVENOUS

## 2019-08-24 MED ORDER — ONDANSETRON HCL 4 MG/2ML IJ SOLN
INTRAMUSCULAR | Status: DC | PRN
  Administered 2019-08-24: 4 mg via INTRAVENOUS

## 2019-08-24 MED ORDER — MIDAZOLAM HCL 2 MG/2ML IJ SOLN
2.0000 mg | Freq: Once | INTRAMUSCULAR | Status: AC
  Administered 2019-08-24: 2 mg via INTRAVENOUS

## 2019-08-24 MED ORDER — 0.9 % SODIUM CHLORIDE (POUR BTL) OPTIME
TOPICAL | Status: DC | PRN
  Administered 2019-08-24: 1000 mL

## 2019-08-24 MED ORDER — OXYCODONE HCL 5 MG PO TABS: ORAL_TABLET | ORAL | 0 refills | Status: AC

## 2019-08-24 MED ORDER — VANCOMYCIN HCL 1000 MG IV SOLR
INTRAVENOUS | Status: AC
  Filled 2019-08-24: qty 1000

## 2019-08-24 MED ORDER — MIDAZOLAM HCL 2 MG/2ML IJ SOLN
INTRAMUSCULAR | Status: DC | PRN
  Administered 2019-08-24: 1 mg via INTRAVENOUS

## 2019-08-24 MED ORDER — ACETAMINOPHEN 500 MG PO TABS: 1000.0000 mg | ORAL_TABLET | Freq: Three times a day (TID) | ORAL | 0 refills | Status: AC

## 2019-08-24 MED ORDER — CEFAZOLIN SODIUM-DEXTROSE 2-4 GM/100ML-% IV SOLN
2.0000 g | INTRAVENOUS | Status: AC
  Administered 2019-08-24: 2 g via INTRAVENOUS

## 2019-08-24 MED ORDER — LACTATED RINGERS IV SOLN: INTRAVENOUS | Status: DC

## 2019-08-24 MED ORDER — FENTANYL CITRATE (PF) 100 MCG/2ML IJ SOLN
INTRAMUSCULAR | Status: DC | PRN
  Administered 2019-08-24 (×2): 25 ug via INTRAVENOUS
  Administered 2019-08-24: 50 ug via INTRAVENOUS

## 2019-08-24 MED ORDER — CEFAZOLIN SODIUM-DEXTROSE 2-4 GM/100ML-% IV SOLN
INTRAVENOUS | Status: AC
  Filled 2019-08-24: qty 100

## 2019-08-24 SURGICAL SUPPLY — 71 items
ANCH SUT 2 SHRT 1.45 DRLBT (Orthopedic Implant) ×2 IMPLANT
ANCHOR JUGGERKNOT W/DRL 2/1.45 (Orthopedic Implant) ×4 IMPLANT
APL PRP STRL LF DISP 70% ISPRP (MISCELLANEOUS) ×1
BLADE AVERAGE 25X9 (BLADE) ×2 IMPLANT
BLADE HEX COATED 2.75 (ELECTRODE) ×2 IMPLANT
BLADE SURG 10 STRL SS (BLADE) ×2 IMPLANT
BLADE SURG 15 STRL LF DISP TIS (BLADE) ×2 IMPLANT
BLADE SURG 15 STRL SS (BLADE) ×4
BNDG CMPR 9X4 STRL LF SNTH (GAUZE/BANDAGES/DRESSINGS) ×1
BNDG ELASTIC 4X5.8 VLCR STR LF (GAUZE/BANDAGES/DRESSINGS) ×2 IMPLANT
BNDG ESMARK 4X9 LF (GAUZE/BANDAGES/DRESSINGS) ×2 IMPLANT
CHLORAPREP W/TINT 26 (MISCELLANEOUS) ×2 IMPLANT
CLSR STERI-STRIP ANTIMIC 1/2X4 (GAUZE/BANDAGES/DRESSINGS) ×2 IMPLANT
COVER WAND RF STERILE (DRAPES) IMPLANT
CUFF TOURN SGL QUICK 18X3 (MISCELLANEOUS) ×2 IMPLANT
CUFF TOURN SGL QUICK 24 (TOURNIQUET CUFF)
CUFF TRNQT CYL 24X4X16.5-23 (TOURNIQUET CUFF) IMPLANT
DECANTER SPIKE VIAL GLASS SM (MISCELLANEOUS) IMPLANT
DRAPE EXTREMITY T 121X128X90 (DISPOSABLE) ×2 IMPLANT
DRAPE INCISE IOBAN 66X45 STRL (DRAPES) IMPLANT
DRAPE OEC MINIVIEW 54X84 (DRAPES) ×2 IMPLANT
DRAPE U-SHAPE 47X51 STRL (DRAPES) ×2 IMPLANT
ELECT REM PT RETURN 9FT ADLT (ELECTROSURGICAL) ×2
ELECTRODE REM PT RTRN 9FT ADLT (ELECTROSURGICAL) ×1 IMPLANT
EXT HOSE W/PLC CONNECTION (MISCELLANEOUS) ×2
EXTENSION HOSE W/PLC CONNECTON (MISCELLANEOUS) ×1 IMPLANT
GAUZE SPONGE 4X4 12PLY STRL (GAUZE/BANDAGES/DRESSINGS) ×2 IMPLANT
GLOVE BIO SURGEON STRL SZ 6.5 (GLOVE) ×2 IMPLANT
GLOVE BIOGEL PI IND STRL 6.5 (GLOVE) ×1 IMPLANT
GLOVE BIOGEL PI IND STRL 8 (GLOVE) ×1 IMPLANT
GLOVE BIOGEL PI INDICATOR 6.5 (GLOVE) ×1
GLOVE BIOGEL PI INDICATOR 8 (GLOVE) ×1
GLOVE ECLIPSE 8.0 STRL XLNG CF (GLOVE) ×2 IMPLANT
GOWN STRL REUS W/ TWL LRG LVL3 (GOWN DISPOSABLE) ×2 IMPLANT
GOWN STRL REUS W/TWL LRG LVL3 (GOWN DISPOSABLE) ×4
GOWN STRL REUS W/TWL XL LVL3 (GOWN DISPOSABLE) ×2 IMPLANT
HEAD RADIAL IMPLANT L2L6 (Orthopedic Implant) ×1 IMPLANT
IMPL RADIAL HEAD 22 (Orthopedic Implant) ×1 IMPLANT
IMPLANT RADIAL HEAD 22 (Orthopedic Implant) ×2 IMPLANT
NS IRRIG 1000ML POUR BTL (IV SOLUTION) ×2 IMPLANT
PACK ARTHROSCOPY DSU (CUSTOM PROCEDURE TRAY) ×2 IMPLANT
PACK BASIN DAY SURGERY FS (CUSTOM PROCEDURE TRAY) ×2 IMPLANT
PAD CAST 4YDX4 CTTN HI CHSV (CAST SUPPLIES) ×1 IMPLANT
PADDING CAST COTTON 4X4 STRL (CAST SUPPLIES) ×2
PENCIL SMOKE EVACUATOR (MISCELLANEOUS) ×2 IMPLANT
RADIAL HEAD IMPLANT L2L6 (Orthopedic Implant) ×2 IMPLANT
RETRIEVER SUT HEWSON (MISCELLANEOUS) ×2 IMPLANT
SHEET MEDIUM DRAPE 40X70 STRL (DRAPES) ×2 IMPLANT
SLEEVE SCD COMPRESS KNEE MED (MISCELLANEOUS) ×2 IMPLANT
SLING ARM FOAM STRAP LRG (SOFTGOODS) ×2 IMPLANT
SPLINT FAST PLASTER 5X30 (CAST SUPPLIES) ×10
SPLINT PLASTER CAST FAST 5X30 (CAST SUPPLIES) ×10 IMPLANT
SPONGE LAP 18X18 RF (DISPOSABLE) ×2 IMPLANT
SUCTION FRAZIER HANDLE 10FR (MISCELLANEOUS) ×2
SUCTION TUBE FRAZIER 10FR DISP (MISCELLANEOUS) ×1 IMPLANT
SUT FIBERWIRE 2-0 18 17.9 3/8 (SUTURE) ×2
SUT MAXBRAID #2 CVD NDL (SUTURE) ×2 IMPLANT
SUT MNCRL AB 4-0 PS2 18 (SUTURE) ×2 IMPLANT
SUT VIC AB 0 CT1 27 (SUTURE) ×2
SUT VIC AB 0 CT1 27XBRD ANBCTR (SUTURE) ×1 IMPLANT
SUT VIC AB 1 CT1 27 (SUTURE)
SUT VIC AB 1 CT1 27XBRD ANBCTR (SUTURE) IMPLANT
SUT VIC AB 2-0 CT1 (SUTURE) IMPLANT
SUT VIC AB 2-0 SH 27 (SUTURE)
SUT VIC AB 2-0 SH 27XBRD (SUTURE) IMPLANT
SUT VIC AB 3-0 SH 27 (SUTURE) ×2
SUT VIC AB 3-0 SH 27X BRD (SUTURE) ×1 IMPLANT
SUTURE FIBERWR 2-0 18 17.9 3/8 (SUTURE) ×1 IMPLANT
SYR BULB EAR ULCER 3OZ GRN STR (SYRINGE) ×2 IMPLANT
TOWEL GREEN STERILE FF (TOWEL DISPOSABLE) ×2 IMPLANT
TUBE SUCTION HIGH CAP CLEAR NV (SUCTIONS) ×2 IMPLANT

## 2019-08-24 NOTE — Anesthesia Procedure Notes (Signed)
Procedure Name: LMA Insertion Date/Time: 08/24/2019 1:02 PM Performed by: [place] Jahi Roza C, CRNA Pre-anesthesia Checklist: Patient identified, Emergency Drugs available, Suction available and Patient being monitored Patient Re-evaluated:Patient Re-evaluated prior to induction Oxygen Delivery Method: Circle system utilized Preoxygenation: Pre-oxygenation with 100% oxygen Induction Type: IV induction Ventilation: Mask ventilation without difficulty LMA: LMA inserted LMA Size: 4.0 Number of attempts: 1 Airway Equipment and Method: Bite block Placement Confirmation: positive ETCO2 Tube secured with: Tape Dental Injury: Teeth and Oropharynx as per pre-operative assessment

## 2019-08-24 NOTE — Progress Notes (Signed)
EKG completed today DOS. On Chart.

## 2019-08-24 NOTE — Anesthesia Postprocedure Evaluation (Signed)
Anesthesia Post Note  Patient: Olivia Carr  Procedure(s) Performed: RADIAL HEAD ARTHROPLASTY (Left Elbow) LIGAMENT REPAIR (Left Elbow)     Patient location during evaluation: PACU Anesthesia Type: General and Regional Level of consciousness: awake and alert Pain management: pain level controlled Vital Signs Assessment: post-procedure vital signs reviewed and stable Respiratory status: spontaneous breathing, nonlabored ventilation, respiratory function stable and patient connected to nasal cannula oxygen Cardiovascular status: blood pressure returned to baseline and stable Postop Assessment: no apparent nausea or vomiting Anesthetic complications: no   No complications documented.  Last Vitals:  Vitals:   08/24/19 1500 08/24/19 1545  BP: (!) 131/84 120/80  Pulse: 65 72  Resp: 12 18  Temp:  36.7 C  SpO2: 97% 95%    Last Pain:  Vitals:   08/24/19 1545  TempSrc:   PainSc: 0-No pain                 Briant Angelillo S

## 2019-08-24 NOTE — Op Note (Signed)
Orthopaedic Surgery Operative Note (CSN: [id number])  Olivia Carr  30-Jan-1960 Date of Surgery: 08/24/2019   Diagnoses:  Left elbow terrible triad injury  Procedure: Left elbow radial head arthroplasty Left elbow open treatment of dislocation Left elbow open loose body excision Left elbow lateral ligament repair Left elbow proximal ulna ORIF   Operative Finding Successful completion of the planned procedure. Patient has significant soft tissue injury to the lateral side of the elbow with complete avulsion of her lateral ligaments from the humerus. Her comminuted radial head fragment on the medial side of the elbow which was difficult to remove. We had good fixation of the capsule and coronoid complex to the anterior portion of the coronoid fracture bed in good repair of the lateral ligament. If she failed she did internal fixator with a hinge and likely a lateral collateral ligament reconstruction. We also have longer stemmed radial head arthroplasty components available.  Post-operative plan: The patient will be discharged home.  The patient will be nonweightbearing in a splint.  DVT prophylaxis not indicated in this ambulatory upper extremity patient without significant risk factors.   Pain control with PRN pain medication preferring oral medicines.  Follow up plan will be scheduled in approximately 7 days for incision check and XR and transition to a hinged brace unlocked.  Post-Op Diagnosis: Same Surgeons:Primary: Hiram Gash, MD Assistants:Caroline McBane PA-C Location: [facility] OR ROOM 6 Anesthesia: General with regional anesthesia Antibiotics: Ancef 2 g with local vancomycin powder 1 g at the surgical site Tourniquet time:  Total Tourniquet Time Documented: Upper Arm (Left) - 72 minutes Total: Upper Arm (Left) - 72 minutes  Estimated Blood Loss: Minimal Complications: None Specimens: None Implants: Implant Name Type Inv. Item Serial No. Manufacturer Lot No. LRB No.  Used Action  KIT IMPLANT JUGGERKNOT SZ2 - [id number] Orthopedic Implant KIT IMPLANT JUGGERKNOT SZ2  ZIMMER RECON(ORTH,TRAU,BIO,SG) [id number] Left 2 Implanted  IMPLANT RADIAL HEAD 22 - [id number] Orthopedic Implant IMPLANT RADIAL HEAD 22  ZIMMER RECON(ORTH,TRAU,BIO,SG) [id number] Left 1 Implanted  Radial stem component Radial Head system L2L 74m diameter +46mheight    Zimmer [id number] 1 Implanted    Indications for Surgery:   Olivia Dados a [age].o. female with fall resulting in a complex elbow fracture dislocation.  Benefits and risks of operative and nonoperative management were discussed prior to surgery with patient/guardian(s) and informed consent form was completed.  Specific risks including infection, need for additional surgery, nonunion, malunion, continued instability, heterotopic bone amongst others   Procedure:   The patient was identified properly. Informed consent was obtained and the surgical site was marked. The patient was taken up to suite where general anesthesia was induced.  The patient was positioned supine on a hand table.  The left elbow was prepped and draped in the usual sterile fashion.  Timeout was performed before the beginning of the case.  Tourniquet was used for the above duration.  Began with a lateral approach to the elbow through Coker's interval. Went to skin sharply achieving hemostasis we progressed. We immediately encountered fracture hematoma and complete avulsion of the lateral structures of the elbow. The lateral ligaments were clearly avulsed from the humerus and only a small amount of the common extensor mass was still attached anteriorly. Fragments of the radial head immediately spit out of the incision. We able to capture these and placed on the back table. We note the radial head was fragmented and greater for pieces inside an arthroplasty was necessary. We made  a osteotomy with an oscillating saw and blunt retractors protecting the  surrounding tissue. That point we put the radial head fragment to the back of the table.  The elbow is clearly unstable and an open reduction was necessary as the elbow would not stay reduced. We at that point were able to reduce the elbow but noted that there is an congruous reduction in a loose body in the medial aspect of the joint. It took a significant amount manipulation but we were able to remove this piece en bloc. At that point the joint reduced well. We had all of the fragments of the radial head noted on the back table and the size to about a 24 mm size. We did feel that downsizing to a 22 would be appropriate.  At this point we noted that the elbow significantly unstable and the anterior capsule was avulsed with the coronoid fragment. We used an ACL guide and a 2 mm K wire through an accessory posterior incision over the proximal ulna to place two drill tunnels and passed a #2 max braid through the capsular coronoid junction. We had robust purchase of the tissue were able to reduce the coronoid fragment and the capsule after shuttling the sutures through our tunnels in the ulna.  Once this was complete we did not tie the sutures we turned our attention to the radial neck again. We irrigated copiously noting that there is no other loose bodies within the joint. We then used the L2 L. Zimmer system to prep the canal. We were able to get a 6 mm reamer down the canal but the seven clearly had too much interference proximally. We had reasonable fit with a 6 mm we felt that that was appropriate. We then sized to a +4 22 mm implant with a 6 mm stem. Trials were placed were happy with her overall reduction in the tension in her system. We irrigated again and placed our final implants.  Once this was complete we in layers closed the annular ligament with 2-0 FiberWire. Once this was complete we placed two 1.6 mm juggernaut anchors around the lateral epicondyle and were able to reinforce and repair the  lateral ligament complex using these anchors. We had a robust lateral repair. The elbow was stable to range of motion. Final fluoroscopic images demonstrated anatomic reduction on three views.  We irrigated the wound copiously before placing local antibiotic as listed above.  We closed the incision in a multilayer fashion with absorbable suture.  Sterile dressing was placed. Well molded well-padded posterior slab splint was placed. Patient was awoken taken to PACU in stable condition.  Noemi Chapel, PA-C, present and scrubbed throughout the case, critical for completion in a timely fashion, and for retraction, instrumentation, closure.

## 2019-08-24 NOTE — Interval H&P Note (Signed)
History and Physical Interval Note:  08/24/2019 12:41 PM  Olivia Carr  has presented today for surgery, with the diagnosis of LEFT ELBOW FRACTURE.  The various methods of treatment have been discussed with the patient and family. After consideration of risks, benefits and other options for treatment, the patient has consented to  Procedure(s): RADIAL HEAD ARTHROPLASTY (Left) as a surgical intervention.  The patient's history has been reviewed, patient examined, no change in status, stable for surgery.  I have reviewed the patient's chart and labs.  Questions were answered to the patient's satisfaction.     Hiram Gash

## 2019-08-24 NOTE — Progress Notes (Signed)
Assisted Dr. Hodierne with left, ultrasound guided, supraclavicular block. Side rails up, monitors on throughout procedure. See vital signs in flow sheet. Tolerated Procedure well. 

## 2019-08-24 NOTE — Anesthesia Procedure Notes (Signed)
Anesthesia Regional Block: Supraclavicular block   Pre-Anesthetic Checklist: ,, timeout performed, Correct Patient, Correct Site, Correct Laterality, Correct Procedure, Correct Position, site marked, Risks and benefits discussed,  Surgical consent,  Pre-op evaluation,  At surgeon's request and post-op pain management  Laterality: Left  Prep: chloraprep       Needles:  Injection technique: Single-shot  Needle Type: Echogenic Stimulator Needle     Needle Length: 5cm  Needle Gauge: 22     Additional Needles:   Procedures:, nerve stimulator,,,,,,,   Nerve Stimulator or Paresthesia:  Response: biceps flexion, 0.45 mA,   Additional Responses:   Narrative:  Start time: 08/24/2019 11:48 AM End time: 08/24/2019 11:56 AM Injection made incrementally with aspirations every 5 mL.  Performed by: Personally  Anesthesiologist: Albertha Ghee, MD  Additional Notes: Functioning IV was confirmed and monitors were applied.  A 55mm 22ga Arrow echogenic stimulator needle was used. Sterile prep and drape,hand hygiene and sterile gloves were used.  Negative aspiration and negative test dose prior to incremental administration of local anesthetic. The patient tolerated the procedure well.  Ultrasound guidance: relevent anatomy identified, needle position confirmed, local anesthetic spread visualized around nerve(s), vascular puncture avoided.  Image printed for medical record.

## 2019-08-24 NOTE — Transfer of Care (Signed)
Immediate Anesthesia Transfer of Care Note  Patient: Olivia Carr  Procedure(s) Performed: RADIAL HEAD ARTHROPLASTY (Left Elbow) LIGAMENT REPAIR (Left Elbow)  Patient Location: PACU  Anesthesia Type:GA combined with regional for post-op pain  Level of Consciousness: awake, alert  and oriented  Airway & Oxygen Therapy: Patient Spontanous Breathing and Patient connected to face mask oxygen  Post-op Assessment: Report given to RN and Post -op Vital signs reviewed and stable  Post vital signs: Reviewed and stable  Last Vitals:  Vitals Value Taken Time  BP 137/86 08/24/19 1448  Temp    Pulse 75 08/24/19 1450  Resp 16 08/24/19 1450  SpO2 99 % 08/24/19 1450  Vitals shown include unvalidated device data.  Last Pain:  Vitals:   08/24/19 1044  TempSrc: Oral  PainSc: 2       Patients Stated Pain Goal: 5 (37/36/68 1594)  Complications: No complications documented.

## 2019-08-24 NOTE — Discharge Instructions (Signed)

## 2019-08-24 NOTE — Anesthesia Preprocedure Evaluation (Signed)
Anesthesia Evaluation  Patient identified by MRN, date of birth, ID band Patient awake    Reviewed: Allergy & Precautions, H&P , NPO status , Patient's Chart, lab work & pertinent test results  Airway Mallampati: II   Neck ROM: full    Dental   Pulmonary asthma ,    breath sounds clear to auscultation       Cardiovascular hypertension,  Rhythm:regular Rate:Normal     Neuro/Psych PSYCHIATRIC DISORDERS Anxiety    GI/Hepatic GERD  ,  Endo/Other    Renal/GU      Musculoskeletal   Abdominal   Peds  Hematology   Anesthesia Other Findings   Reproductive/Obstetrics                             Anesthesia Physical Anesthesia Plan  ASA: II  Anesthesia Plan: General   Post-op Pain Management:  Regional for Post-op pain   Induction: Intravenous  PONV Risk Score and Plan: 3 and Ondansetron, Dexamethasone, Midazolam and Treatment may vary due to age or medical condition  Airway Management Planned: LMA  Additional Equipment:   Intra-op Plan:   Post-operative Plan: Extubation in OR  Informed Consent: I have reviewed the patients History and Physical, chart, labs and discussed the procedure including the risks, benefits and alternatives for the proposed anesthesia with the patient or authorized representative who has indicated his/her understanding and acceptance.       Plan Discussed with: CRNA, Anesthesiologist and Surgeon  Anesthesia Plan Comments:         Anesthesia Quick Evaluation

## 2019-08-26 ENCOUNTER — Encounter (HOSPITAL_BASED_OUTPATIENT_CLINIC_OR_DEPARTMENT_OTHER): Payer: Self-pay | Admitting: Orthopaedic Surgery

## 2019-08-26 DIAGNOSIS — J301 Allergic rhinitis due to pollen: Secondary | ICD-10-CM | POA: Diagnosis not present

## 2019-08-26 DIAGNOSIS — J3089 Other allergic rhinitis: Secondary | ICD-10-CM | POA: Diagnosis not present

## 2019-08-26 DIAGNOSIS — J3081 Allergic rhinitis due to animal (cat) (dog) hair and dander: Secondary | ICD-10-CM | POA: Diagnosis not present

## 2019-09-05 ENCOUNTER — Other Ambulatory Visit: Payer: Self-pay | Admitting: Family Medicine

## 2019-09-26 ENCOUNTER — Telehealth: Payer: Self-pay | Admitting: Family Medicine

## 2019-09-26 NOTE — Telephone Encounter (Signed)
Pt would like to have a Tdap vaccine so that she can go and visit with her upcoming grand baby.  Pt would like to have it before the 1st of November.

## 2019-09-27 NOTE — Telephone Encounter (Signed)
Please give her a TDaP

## 2019-10-07 ENCOUNTER — Other Ambulatory Visit: Payer: Self-pay

## 2019-10-07 ENCOUNTER — Ambulatory Visit (INDEPENDENT_AMBULATORY_CARE_PROVIDER_SITE_OTHER): Payer: BC Managed Care – PPO

## 2019-10-07 DIAGNOSIS — Z23 Encounter for immunization: Secondary | ICD-10-CM

## 2019-10-07 NOTE — Progress Notes (Signed)
Per orders of Dr. Sarajane Jews, injection of TDap given by Wyvonne Lenz. Patient tolerated injection well.

## 2019-12-02 ENCOUNTER — Other Ambulatory Visit: Payer: Self-pay | Admitting: Family Medicine

## 2019-12-05 MED ORDER — FLUTICASONE PROPIONATE 50 MCG/ACT NA SUSP: 2.0000 | Freq: Every day | NASAL | 0 refills | Status: DC

## 2019-12-05 MED ORDER — MONTELUKAST SODIUM 10 MG PO TABS: 10.0000 mg | ORAL_TABLET | Freq: Every day | ORAL | 0 refills | Status: DC

## 2019-12-05 MED ORDER — METOPROLOL SUCCINATE ER 25 MG PO TB24: 25.0000 mg | ORAL_TABLET | Freq: Every day | ORAL | 0 refills | Status: DC

## 2019-12-12 ENCOUNTER — Other Ambulatory Visit: Payer: Self-pay | Admitting: Family Medicine

## 2019-12-13 IMAGING — MG DIGITAL SCREENING BILATERAL MAMMOGRAM WITH TOMO AND CAD
6 of 10 series · 6 of 30 positions shown · non-contrast
Comparison: Previous exam(s).

CLINICAL DATA: Screening.

EXAM:
DIGITAL SCREENING BILATERAL MAMMOGRAM WITH TOMO AND CAD

[R MLO synth-2D]
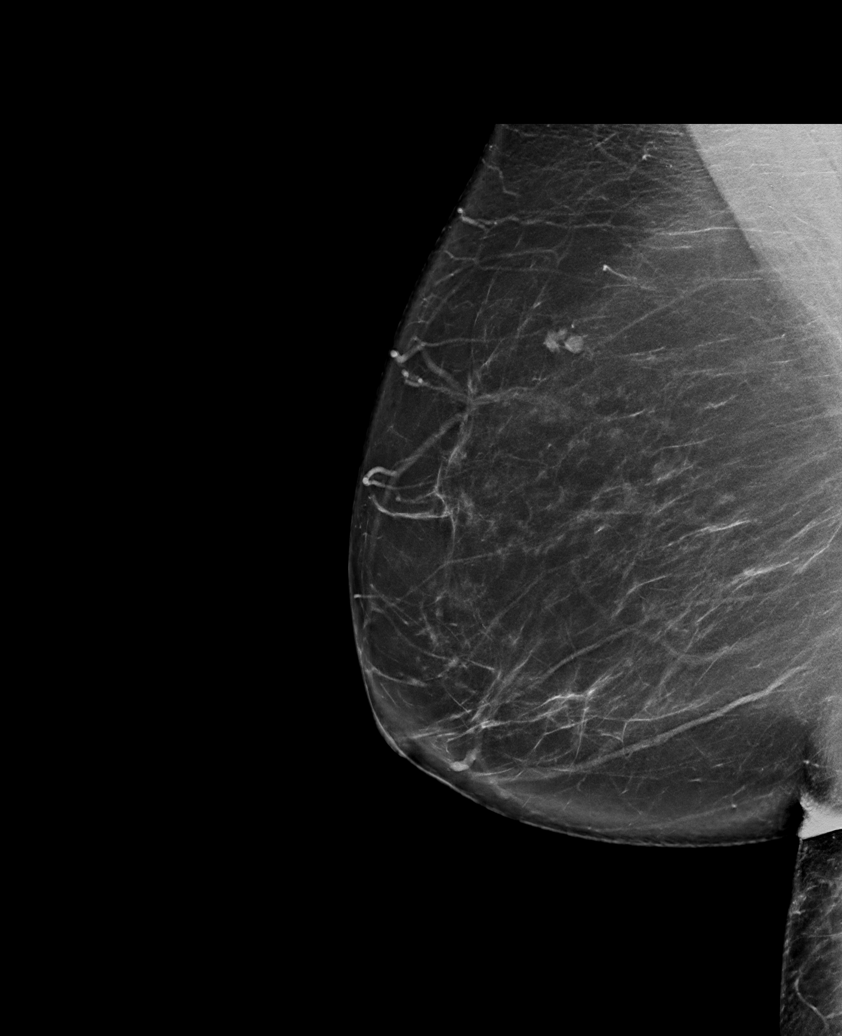

[R CC synth-2D]
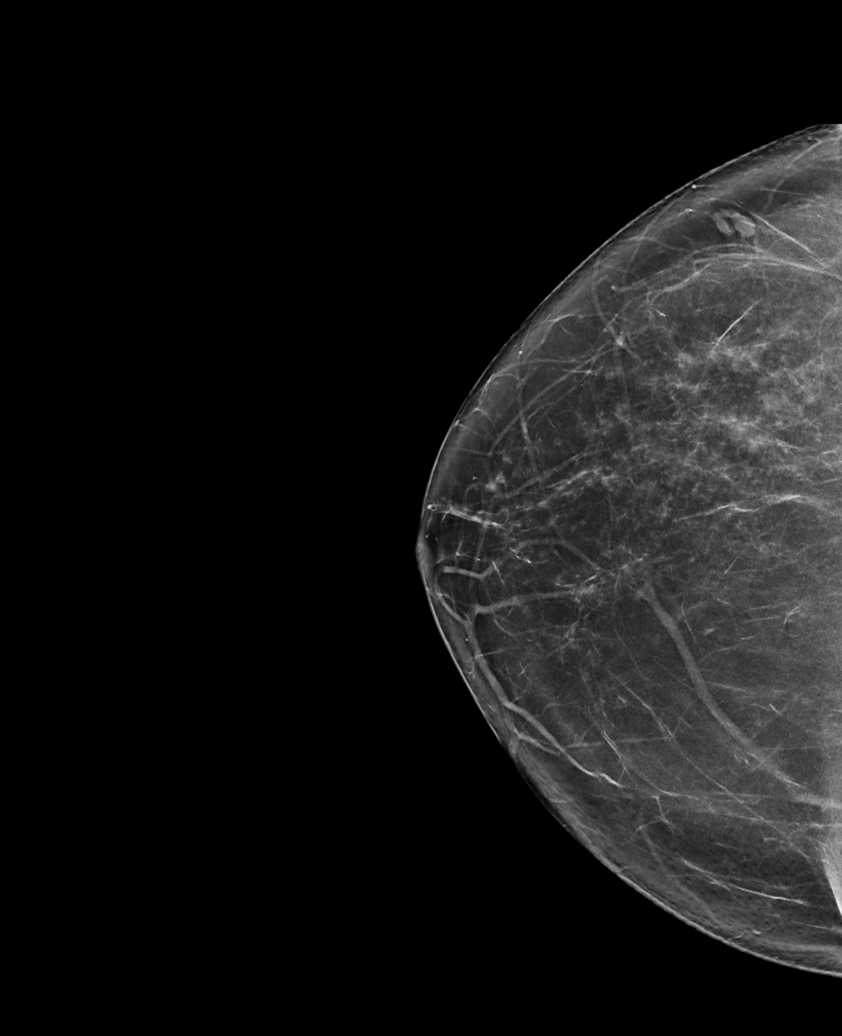

[L CC synth-2D (1 of 2)]
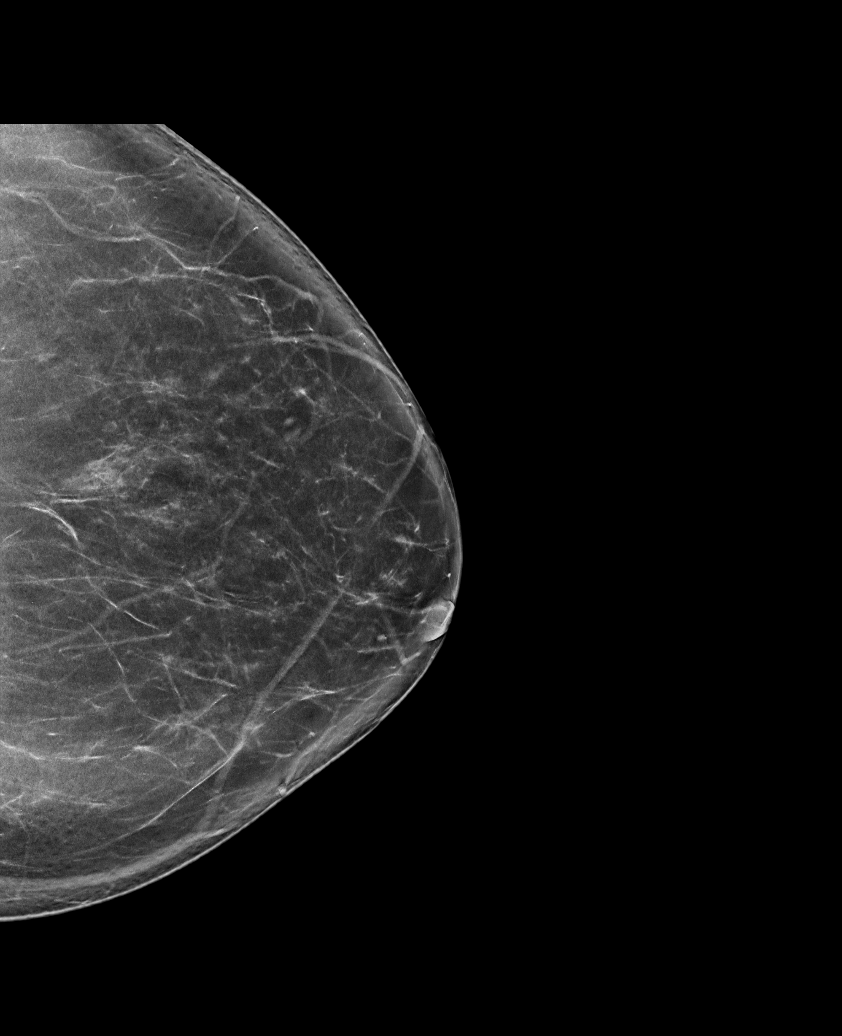

[L CC synth-2D (2 of 2)]
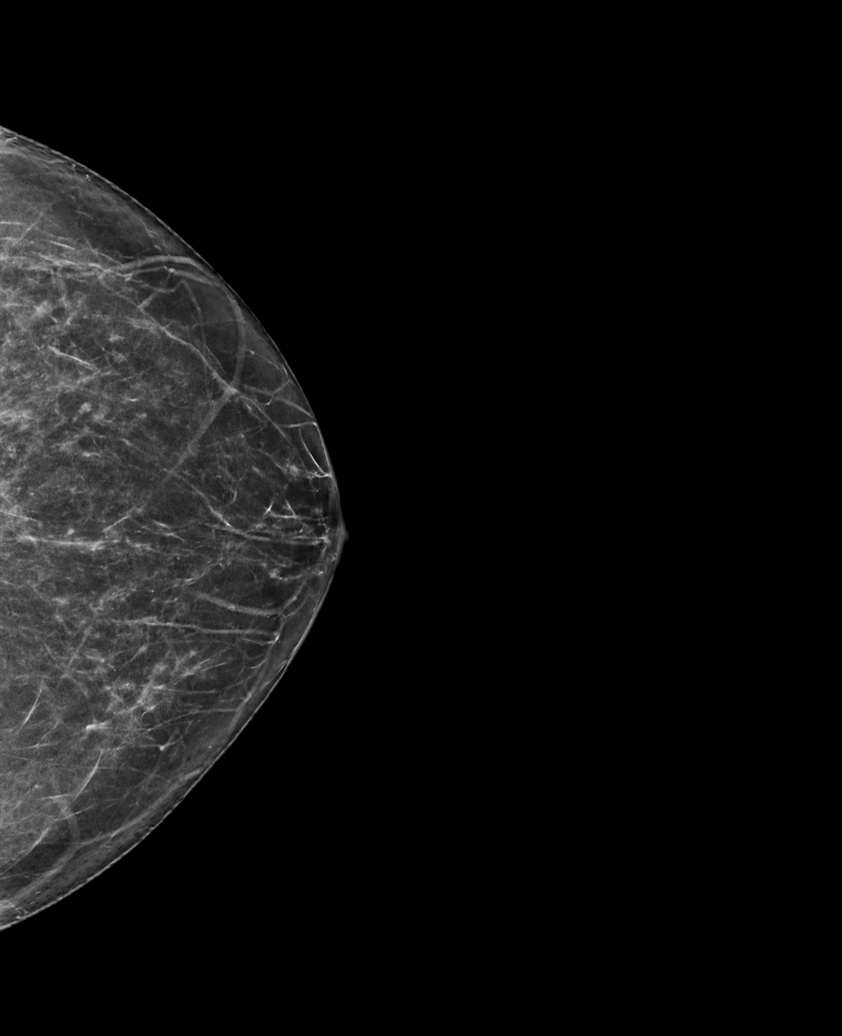

[L MLO synth-2D]
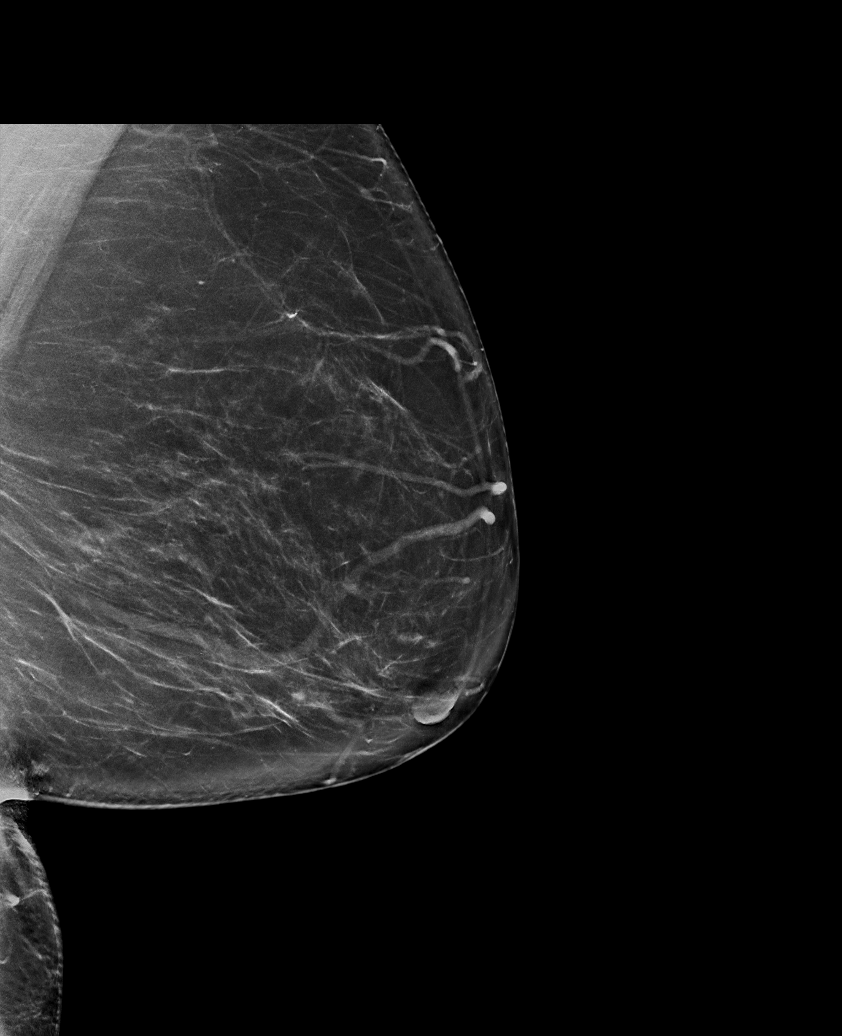

[L CC tomo · tomo slice 35/69.0]
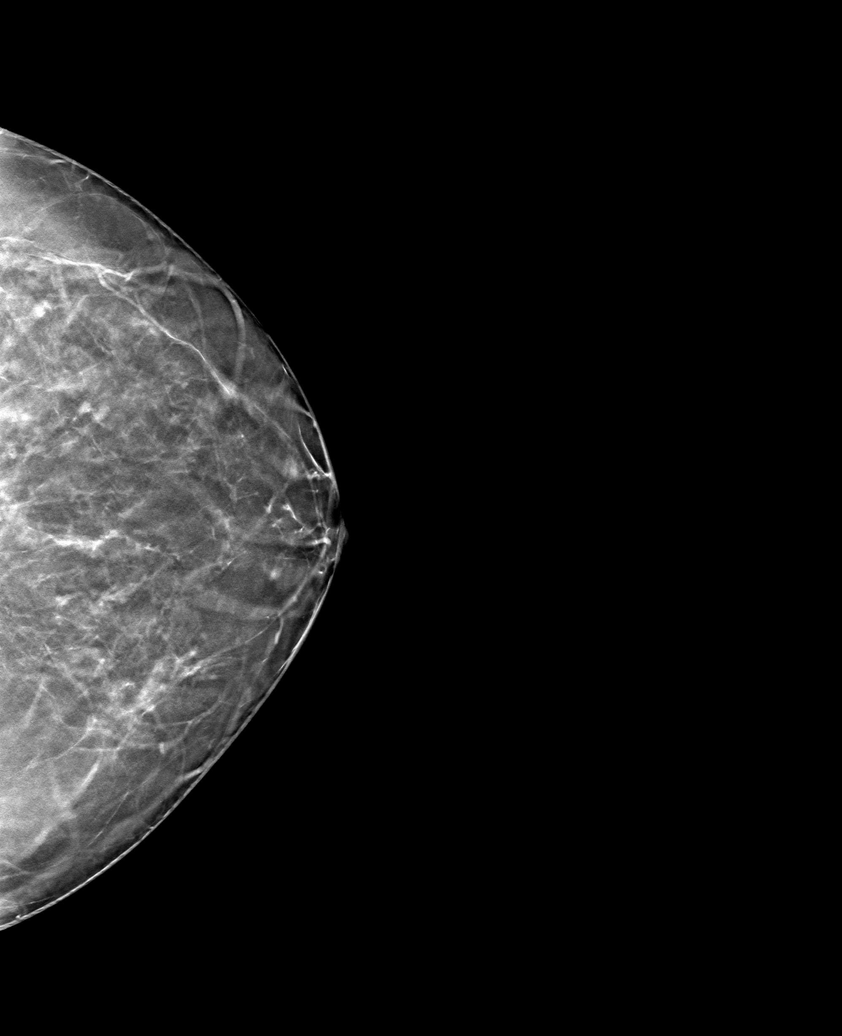

[6 of 30 positions shown; findings below may reference images not displayed]

ACR Breast Density Category b: There are scattered areas of
fibroglandular density.
FINDINGS: There are no findings suspicious for malignancy. Images were
processed with CAD.
IMPRESSION: No mammographic evidence of malignancy. A result letter of this
screening mammogram will be mailed directly to the patient.

RECOMMENDATION:
Screening mammogram in one year. (Code:[id number]-775)

BI-RADS CATEGORY  1: Negative.

## 2019-12-27 ENCOUNTER — Encounter: Payer: Self-pay | Admitting: Family Medicine

## 2019-12-27 ENCOUNTER — Ambulatory Visit (INDEPENDENT_AMBULATORY_CARE_PROVIDER_SITE_OTHER): Payer: BC Managed Care – PPO | Admitting: Family Medicine

## 2019-12-27 ENCOUNTER — Other Ambulatory Visit: Payer: Self-pay

## 2019-12-27 VITALS — BP 118/84 | HR 74 | Temp 98.4°F | Ht 64.5 in | Wt 203.0 lb

## 2019-12-27 DIAGNOSIS — Z0000 Encounter for general adult medical examination without abnormal findings: Secondary | ICD-10-CM | POA: Diagnosis not present

## 2019-12-27 LAB — CBC WITH DIFFERENTIAL/PLATELET
Basophils Absolute: 0.1 10*3/uL (ref 0.0–0.1)
Basophils Relative: 1.1 % (ref 0.0–3.0)
Eosinophils Absolute: 0.2 10*3/uL (ref 0.0–0.7)
Eosinophils Relative: 3.9 % (ref 0.0–5.0)
HCT: 38.6 % (ref 36.0–46.0)
Hemoglobin: 12.4 g/dL (ref 12.0–15.0)
Lymphocytes Relative: 26 % (ref 12.0–46.0)
Lymphs Abs: 1.5 10*3/uL (ref 0.7–4.0)
MCHC: 32 g/dL (ref 30.0–36.0)
MCV: 75.2 fl — ABNORMAL LOW (ref 78.0–100.0)
Monocytes Absolute: 0.5 10*3/uL (ref 0.1–1.0)
Monocytes Relative: 9.2 % (ref 3.0–12.0)
Neutro Abs: 3.4 10*3/uL (ref 1.4–7.7)
Neutrophils Relative %: 59.8 % (ref 43.0–77.0)
Platelets: 274 10*3/uL (ref 150.0–400.0)
RBC: 5.14 Mil/uL — ABNORMAL HIGH (ref 3.87–5.11)
RDW: 18.9 % — ABNORMAL HIGH (ref 11.5–15.5)
WBC: 5.7 10*3/uL (ref 4.0–10.5)

## 2019-12-27 LAB — BASIC METABOLIC PANEL
BUN: 16 mg/dL (ref 6–23)
CO2: 29 mEq/L (ref 19–32)
Calcium: 9.1 mg/dL (ref 8.4–10.5)
Chloride: 106 mEq/L (ref 96–112)
Creatinine, Ser: 0.7 mg/dL (ref 0.40–1.20)
GFR: 96.15 mL/min (ref >60.00)
Glucose, Bld: 105 mg/dL — ABNORMAL HIGH (ref 70–99)
Potassium: 4.4 mEq/L (ref 3.5–5.1)
Sodium: 142 mEq/L (ref 135–145)

## 2019-12-27 LAB — TSH: TSH: 1.45 u[IU]/mL (ref 0.35–4.50)

## 2019-12-27 LAB — HEPATIC FUNCTION PANEL
ALT: 13 U/L (ref 0–35)
AST: 16 U/L (ref 0–37)
Albumin: 3.9 g/dL (ref 3.5–5.2)
Alkaline Phosphatase: 69 U/L (ref 39–117)
Bilirubin, Direct: 0.2 mg/dL (ref 0.0–0.3)
Total Bilirubin: 1 mg/dL (ref 0.2–1.2)
Total Protein: 6.6 g/dL (ref 6.0–8.3)

## 2019-12-27 LAB — LIPID PANEL
Cholesterol: 178 mg/dL (ref 0–200)
HDL: 58.9 mg/dL (ref >39.00)
LDL Cholesterol: 102 mg/dL — ABNORMAL HIGH (ref 0–99)
NonHDL: 119.49
Total CHOL/HDL Ratio: 3
Triglycerides: 88 mg/dL (ref 0.0–149.0)
VLDL: 17.6 mg/dL (ref 0.0–40.0)

## 2019-12-27 NOTE — Progress Notes (Signed)
   Subjective:    Patient ID: Olivia Carr, female    DOB: 03-Jun-1960, 59 y.o.   MRN: [id number]  HPI Here for a well exam. She feels well. She had a fracture dislocation of her left elbow from a fall in July. This was repaired surgically and she has been doing PT for this ever since. She now has about 90-95% of her ROM back.    Review of Systems  Constitutional: Negative.   HENT: Negative.   Eyes: Negative.   Respiratory: Negative.   Cardiovascular: Negative.   Gastrointestinal: Negative.   Genitourinary: Negative for decreased urine volume, difficulty urinating, dyspareunia, dysuria, enuresis, flank pain, frequency, hematuria, pelvic pain and urgency.  Musculoskeletal: Negative.   Skin: Negative.   Neurological: Negative.   Psychiatric/Behavioral: Negative.        Objective:   Physical Exam Constitutional:      General: She is not in acute distress.    Appearance: She is well-developed.  HENT:     Head: Normocephalic and atraumatic.     Right Ear: External ear normal.     Left Ear: External ear normal.     Nose: Nose normal.     Mouth/Throat:     Pharynx: No oropharyngeal exudate.  Eyes:     General: No scleral icterus.    Conjunctiva/sclera: Conjunctivae normal.     Pupils: Pupils are equal, round, and reactive to light.  Neck:     Thyroid: No thyromegaly.     Vascular: No JVD.  Cardiovascular:     Rate and Rhythm: Normal rate and regular rhythm.     Heart sounds: Normal heart sounds. No murmur heard.  No friction rub. No gallop.   Pulmonary:     Effort: Pulmonary effort is normal. No respiratory distress.     Breath sounds: Normal breath sounds. No wheezing or rales.  Chest:     Chest wall: No tenderness.  Abdominal:     General: Bowel sounds are normal. There is no distension.     Palpations: Abdomen is soft. There is no mass.     Tenderness: There is no abdominal tenderness. There is no guarding or rebound.  Musculoskeletal:        General:  No tenderness. Normal range of motion.     Cervical back: Normal range of motion and neck supple.  Lymphadenopathy:     Cervical: No cervical adenopathy.  Skin:    General: Skin is warm and dry.     Findings: No erythema or rash.  Neurological:     Mental Status: She is alert and oriented to person, place, and time.     Cranial Nerves: No cranial nerve deficit.     Motor: No abnormal muscle tone.     Coordination: Coordination normal.     Deep Tendon Reflexes: Reflexes are normal and symmetric. Reflexes normal.  Psychiatric:        Behavior: Behavior normal.        Thought Content: Thought content normal.        Judgment: Judgment normal.           Assessment & Plan:  Well exam. We discussed diet and exercise. Get fasting labs.  Alysia Penna, MD

## 2019-12-29 NOTE — Progress Notes (Signed)
Normal except borderline glucose. Watch the sugars and carbs in the diet

## 2020-02-06 ENCOUNTER — Encounter: Payer: Self-pay | Admitting: Family Medicine

## 2020-02-06 ENCOUNTER — Telehealth (INDEPENDENT_AMBULATORY_CARE_PROVIDER_SITE_OTHER): Payer: BC Managed Care – PPO | Admitting: Family Medicine

## 2020-02-06 VITALS — Temp 98.0°F | Wt 205.0 lb

## 2020-02-06 DIAGNOSIS — Z20822 Contact with and (suspected) exposure to covid-19: Secondary | ICD-10-CM

## 2020-02-06 DIAGNOSIS — U071 COVID-19: Secondary | ICD-10-CM | POA: Diagnosis not present

## 2020-02-06 NOTE — Progress Notes (Signed)
Virtual Visit via Video Note  I connected with Olivia Carr on 02/06/20 at  4:30 PM EST by a video enabled telemedicine application 2/2 HERDE-08 pandemic and verified that I am speaking with the correct person using two identifiers.  Location patient: home Location provider:work or home office Persons participating in the virtual visit: patient, provider  I discussed the limitations of evaluation and management by telemedicine and the availability of in person appointments. The patient expressed understanding and agreed to proceed.   HPI: Pt is a 60 yo female with pmh sig for allergies, GERD, HTN, asthma who is followed by Dr. Sarajane Jews.  Pt got allergy shots on last Tuesday and her [place] booster on last Thursday.  Pt's sister called to say several family members were positive for COVID.  Pt saw those family members 12/30. Pt tested negative Friday and positive on Sunday with an at home COVID test.  Pt had scratchy throat, sneezing, stuffy nose, over the wknd.  Body ache, cervical lymphadenopathy, upper chest congestion, dry cough, fatigue, and brain fog today. Denies fever, change in appetite.  Taking Singluair, zyrtec, flonase, ibuprofen.  ROS: See pertinent positives and negatives per HPI.  Past Medical History:  Diagnosis Date  . Allergic rhinitis    sees Dr. Donneta Romberg   . Asthma    sees Dr. Donneta Romberg   . 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 DUB (dysfunctional uterine bleeding)   . GERD (gastroesophageal reflux disease)   . History of bone density study 03-22-13   normal   . Hypertension   . NSVD (normal spontaneous vaginal delivery)    X2  . Pneumonia   . Routine gynecological examination    sees Dr. Uvaldo Rising     Past Surgical History:  Procedure Laterality Date  . ABDOMINAL HYSTERECTOMY     [facility]  . ANTERIOR CRUCIATE LIGAMENT REPAIR     right knee with medial and lateral meniscectomies 09/04/08  . COLONOSCOPY   12/2017   per Dr. Mary Sella [facility]), precancerous polyps, repeat in 3 yrs   . DILATION AND CURETTAGE OF UTERUS     x 2  . KNEE ARTHROSCOPY Left 2018  . LIGAMENT REPAIR Left 08/24/2019   Procedure: LIGAMENT REPAIR;  Surgeon: Hiram Gash, MD;  Location: [facility];  Service: Orthopedics;  Laterality: Left;  . MYOMECTOMY ABDOMINAL APPROACH    . RADIAL HEAD ARTHROPLASTY Left 08/24/2019   Procedure: RADIAL HEAD ARTHROPLASTY;  Surgeon: Hiram Gash, MD;  Location: [facility];  Service: Orthopedics;  Laterality: Left;  . TONSILLECTOMY    . WISDOM TOOTH EXTRACTION      Family History  Problem Relation Age of Onset  . Hypertension Mother   . Cancer Father 7       PANCREATIC  . Other Other        bowel disease  . Diabetes Other   . Hypertension Other   . Coronary artery disease Other   . Heart disease Maternal Grandmother   . Cancer Maternal Grandfather        COLON  . Heart disease Paternal Grandmother   . Heart disease Paternal Grandfather   . Immunocompromised Brother   . Breast cancer Neg Hx      Current Outpatient Medications:  .  albuterol (VENTOLIN HFA) 108 (90 Base) MCG/ACT inhaler, Inhale 1-2 puffs into the lungs every 6 (six) hours as needed for wheezing or shortness  of breath., Disp: , Rfl:  .  Calcium Carbonate-Vit D-Min (CALCIUM 1200 PO), Take 1 tablet by mouth every evening. , Disp: , Rfl:  .  cetirizine (ZYRTEC) 10 MG tablet, Take 10 mg by mouth daily., Disp: , Rfl:  .  esomeprazole (NEXIUM) 40 MG capsule, TAKE 1 CAPSULE DAILY, Disp: 90 capsule, Rfl: 3 .  fluticasone (FLONASE) 50 MCG/ACT nasal spray, Place 2 sprays into both nostrils daily., Disp: 48 g, Rfl: 0 .  metoprolol succinate (TOPROL-XL) 25 MG 24 hr tablet, Take 1 tablet (25 mg total) by mouth daily., Disp: 90 tablet, Rfl: 0 .  montelukast (SINGULAIR) 10 MG tablet, Take 1 tablet (10 mg total) by mouth at bedtime., Disp: 90 tablet, Rfl: 0 .  potassium chloride (KLOR-CON) 10 MEQ tablet,  TAKE 1 TABLET DAILY, Disp: 90 tablet, Rfl: 3  EXAM:  VITALS per patient if applicable: RR between 92-33 bpm  GENERAL: alert, oriented, appears well and in no acute distress  HEENT: atraumatic, conjunctiva clear, no obvious abnormalities on inspection of external nose and ears  NECK: normal movements of the head and neck  LUNGS: on inspection no signs of respiratory distress, breathing rate appears normal, no obvious gross SOB, gasping or wheezing  CV: no obvious cyanosis  MS: moves all visible extremities without noticeable abnormality  PSYCH/NEURO: pleasant and cooperative, no obvious depression or anxiety, speech and thought processing grossly intact  ASSESSMENT AND PLAN:  Discussed the following assessment and plan:  COVID-19 virus infection  Close exposure to COVID-19 virus  -positive at home test after close contact with positive family members -continue supportive care -given strict precautions for worsened symptoms -consider mab, pt declines at this time.  F/u prn   I discussed the assessment and treatment plan with the patient. The patient was provided an opportunity to ask questions and all were answered. The patient agreed with the plan and demonstrated an understanding of the instructions.   The patient was advised to call back or seek an in-person evaluation if the symptoms worsen or if the condition fails to improve as anticipated.   Olivia Ruddy, MD

## 2020-02-23 ENCOUNTER — Other Ambulatory Visit: Payer: Self-pay | Admitting: Family Medicine

## 2020-03-06 ENCOUNTER — Other Ambulatory Visit: Payer: Self-pay | Admitting: Family Medicine

## 2020-03-06 DIAGNOSIS — Z1231 Encounter for screening mammogram for malignant neoplasm of breast: Secondary | ICD-10-CM

## 2020-03-15 ENCOUNTER — Ambulatory Visit (INDEPENDENT_AMBULATORY_CARE_PROVIDER_SITE_OTHER): Payer: BC Managed Care – PPO | Admitting: Obstetrics & Gynecology

## 2020-03-15 ENCOUNTER — Other Ambulatory Visit: Payer: Self-pay

## 2020-03-15 ENCOUNTER — Encounter: Payer: Self-pay | Admitting: Obstetrics & Gynecology

## 2020-03-15 VITALS — BP 128/84 | Ht 64.0 in | Wt 208.0 lb

## 2020-03-15 DIAGNOSIS — E6609 Other obesity due to excess calories: Secondary | ICD-10-CM

## 2020-03-15 DIAGNOSIS — Z90710 Acquired absence of both cervix and uterus: Secondary | ICD-10-CM

## 2020-03-15 DIAGNOSIS — Z780 Asymptomatic menopausal state: Secondary | ICD-10-CM

## 2020-03-15 DIAGNOSIS — Z1272 Encounter for screening for malignant neoplasm of vagina: Secondary | ICD-10-CM

## 2020-03-15 DIAGNOSIS — Z6835 Body mass index (BMI) 35.0-35.9, adult: Secondary | ICD-10-CM

## 2020-03-15 DIAGNOSIS — Z01419 Encounter for gynecological examination (general) (routine) without abnormal findings: Secondary | ICD-10-CM | POA: Diagnosis not present

## 2020-03-15 NOTE — Progress Notes (Signed)
Olivia Carr 25-Jun-1960 [id number]   History:    60 y.o. G3P2A1L2 Married. Daughters 37 and 85 yo.  1st grand-daughter 10/2019.  PJ:[id number] presenting for annual gyn exam   HPI:S/P LAVH.Postmenopause, well on no hormone replacement therapy. No pelvic pain. No pain with intercourse. Breasts normal. Body mass index improved to 35.7. Lt elbow fracture, finishing PT.  Starting back with Trainer 03/27/2020. Health labs with Dr. Sharlene Motts.Colonoscopy December 2019 showed benign polyps, on a 3 yr schedule.   Past medical history,surgical history, family history and social history were all reviewed and documented in the EPIC chart.  Gynecologic History No LMP recorded. Patient has had a hysterectomy.  Obstetric History OB History  Gravida Para Term Preterm AB Living  3 2 2   1 2   SAB IAB Ectopic Multiple Live Births  1       2    # Outcome Date GA Lbr Len/2nd Weight Sex Delivery Anes PTL Lv  3 SAB           2 Term     F Vag-Spont  N LIV  1 Term     F Vag-Spont  N LIV     ROS: A ROS was performed and pertinent positives and negatives are included in the history.  GENERAL: No fevers or chills. HEENT: No change in vision, no earache, sore throat or sinus congestion. NECK: No pain or stiffness. CARDIOVASCULAR: No chest pain or pressure. No palpitations. PULMONARY: No shortness of breath, cough or wheeze. GASTROINTESTINAL: No abdominal pain, nausea, vomiting or diarrhea, melena or bright red blood per rectum. GENITOURINARY: No urinary frequency, urgency, hesitancy or dysuria. MUSCULOSKELETAL: No joint or muscle pain, no back pain, no recent trauma. DERMATOLOGIC: No rash, no itching, no lesions. ENDOCRINE: No polyuria, polydipsia, no heat or cold intolerance. No recent change in weight. HEMATOLOGICAL: No anemia or easy bruising or bleeding. NEUROLOGIC: No headache, seizures, numbness, tingling or weakness. PSYCHIATRIC: No depression, no loss of interest in  normal activity or change in sleep pattern.     Exam:   BP 128/84   Ht 5\' 4"  (1.626 m)   Wt 208 lb (94.3 kg)   BMI 35.70 kg/m   Body mass index is 35.7 kg/m.  General appearance : Well developed well nourished female. No acute distress HEENT: Eyes: no retinal hemorrhage or exudates,  Neck supple, trachea midline, no carotid bruits, no thyroidmegaly Lungs: Clear to auscultation, no rhonchi or wheezes, or rib retractions  Heart: Regular rate and rhythm, no murmurs or gallops Breast:Examined in sitting and supine position were symmetrical in appearance, no palpable masses or tenderness,  no skin retraction, no nipple inversion, no nipple discharge, no skin discoloration, no axillary or supraclavicular lymphadenopathy Abdomen: no palpable masses or tenderness, no rebound or guarding Extremities: no edema or skin discoloration or tenderness  Pelvic: Vulva: Normal             Vagina: No gross lesions or discharge.  Pap reflex done.  Cervix/Uterus absent  Adnexa  Without masses or tenderness  Anus: Normal   Assessment/Plan:  60 y.o. female for annual exam   1. Encounter for Papanicolaou smear of vagina as part of routine gynecological examination Gynecologic exam s/p LAVH in menopause.  Pap reflex done.  Breast exam normal.  Screening mammo 03/2019 Neg.  Colono due 12/2020.  Health labs with Fam MD.  2. S/P laparoscopic assisted vaginal hysterectomy (LAVH)  3. Postmenopausal Well on no HRT.  4. Class 2 obesity due to  excess calories without serious comorbidity with body mass index (BMI) of 35.0 to 35.9 in adult Low calorie/carb diet.  Fitness with trainer starting back.  Princess Bruins MD, 8:16 AM 03/15/2020

## 2020-03-15 NOTE — Addendum Note (Signed)
Addended by: Thurnell Garbe A on: 03/15/2020 09:02 AM   Modules accepted: Orders

## 2020-03-16 LAB — PAP IG W/ RFLX HPV ASCU

## 2020-04-24 ENCOUNTER — Other Ambulatory Visit: Payer: Self-pay

## 2020-04-24 ENCOUNTER — Ambulatory Visit
Admission: RE | Admit: 2020-04-24 | Discharge: 2020-04-24 | Disposition: A | Discharge status: Home / Self Care | Payer: BC Managed Care – PPO | Source: Ambulatory Visit | Attending: Family Medicine | Admitting: Family Medicine

## 2020-04-24 DIAGNOSIS — Z1231 Encounter for screening mammogram for malignant neoplasm of breast: Secondary | ICD-10-CM

## 2020-04-26 ENCOUNTER — Other Ambulatory Visit: Payer: Self-pay | Admitting: Family Medicine

## 2020-04-26 DIAGNOSIS — R928 Other abnormal and inconclusive findings on diagnostic imaging of breast: Secondary | ICD-10-CM

## 2020-05-16 ENCOUNTER — Ambulatory Visit
Admission: RE | Admit: 2020-05-16 | Discharge: 2020-05-16 | Disposition: A | Discharge status: Home / Self Care | Payer: BC Managed Care – PPO | Source: Ambulatory Visit | Attending: Family Medicine | Admitting: Family Medicine

## 2020-05-16 ENCOUNTER — Other Ambulatory Visit: Payer: Self-pay

## 2020-05-16 ENCOUNTER — Other Ambulatory Visit: Payer: Self-pay | Admitting: Family Medicine

## 2020-05-16 DIAGNOSIS — R928 Other abnormal and inconclusive findings on diagnostic imaging of breast: Secondary | ICD-10-CM

## 2020-08-05 ENCOUNTER — Other Ambulatory Visit: Payer: Self-pay | Admitting: Family Medicine

## 2020-09-19 ENCOUNTER — Encounter (HOSPITAL_COMMUNITY): Payer: Self-pay | Admitting: *Deleted

## 2020-09-19 ENCOUNTER — Ambulatory Visit (HOSPITAL_COMMUNITY)
Admission: EM | Admit: 2020-09-19 | Discharge: 2020-09-19 | Disposition: A | Discharge status: Home / Self Care | Payer: BC Managed Care – PPO | Attending: Internal Medicine | Admitting: Internal Medicine

## 2020-09-19 ENCOUNTER — Other Ambulatory Visit: Payer: Self-pay

## 2020-09-19 DIAGNOSIS — N390 Urinary tract infection, site not specified: Secondary | ICD-10-CM | POA: Insufficient documentation

## 2020-09-19 LAB — POCT URINALYSIS DIPSTICK, ED / UC
Bilirubin Urine: NEGATIVE
Glucose, UA: NEGATIVE mg/dL
Ketones, ur: NEGATIVE mg/dL
Nitrite: NEGATIVE
Protein, ur: 100 mg/dL — AB
Specific Gravity, Urine: 1.01 (ref 1.005–1.030)
Urobilinogen, UA: 0.2 mg/dL (ref 0.0–1.0)
pH: 6 (ref 5.0–8.0)

## 2020-09-19 MED ORDER — CEPHALEXIN 500 MG PO CAPS: 500.0000 mg | ORAL_CAPSULE | Freq: Two times a day (BID) | ORAL | 0 refills | Status: DC

## 2020-09-19 NOTE — Discharge Instructions (Addendum)
Take the Keflex twice a day for the next 7 days.    You can take Tylenol and/or Ibuprofen as needed for pain relief and fever reduction.   Make sure you are drinking plenty of fluids, especially water.  You can drink cranberry juice to help with symptom relief, but make sure it is cranberry juice and not cranberry cocktail.  You can also try AZO, cranberry pills, or pyridium as needed.    Return or go to the Emergency Department if symptoms worsen or do not improve in the next few days.

## 2020-09-19 NOTE — ED Provider Notes (Signed)
[facility]   CC: UTI  SUBJECTIVE:  Olivia Carr is a 60 y.o. female who complains of urinary frequency, urgency and dysuria since yesterday.  Patient denies a precipitating event, recent sexual encounter, excessive caffeine intake. Localizes the pain to the lower abdomen/ flank.  Pain is intermittent and describes it as sharp/burning.  Has not tried OTC medications.  Symptoms are made worse with urination. Admits to similar symptoms in the past.  Denies fever, chills, nausea, vomiting, vaginal discharge or bleeding, hematuria.    LMP: No LMP recorded. Patient has had a hysterectomy.  ROS: As in HPI.  All other pertinent ROS negative.     Past Medical History:  Diagnosis Date   Allergic rhinitis    sees Dr. Donneta Romberg    Asthma    sees Dr. Donneta Romberg    Benign tumor of pineal gland [facility])     last MRI 10-10-09, stable, no follow up planned   DUB (dysfunctional uterine bleeding)    GERD (gastroesophageal reflux disease)    History of bone density study 03-22-13   normal    Hypertension    NSVD (normal spontaneous vaginal delivery)    X2   Pneumonia    Routine gynecological examination    sees Dr. Uvaldo Rising    Past Surgical History:  Procedure Laterality Date   ABDOMINAL HYSTERECTOMY     LAVH   ANTERIOR CRUCIATE LIGAMENT REPAIR     right knee with medial and lateral meniscectomies 09/04/08   COLONOSCOPY  12/2017   per Dr. Mary Sella Sadie Haber), precancerous polyps, repeat in 3 yrs    DILATION AND CURETTAGE OF UTERUS     x 2   KNEE ARTHROSCOPY Left 2018   LIGAMENT REPAIR Left 08/24/2019   Procedure: LIGAMENT REPAIR;  Surgeon: Hiram Gash, MD;  Location: [facility];  Service: Orthopedics;  Laterality: Left;   MYOMECTOMY ABDOMINAL APPROACH     RADIAL HEAD ARTHROPLASTY Left 08/24/2019   Procedure: RADIAL HEAD ARTHROPLASTY;  Surgeon: Hiram Gash, MD;  Location: [place];  Service: Orthopedics;  Laterality: Left;   TONSILLECTOMY      WISDOM TOOTH EXTRACTION     Allergies  Allergen Reactions   Nitrofurantoin    Prochlorperazine Edisylate    Sulfa Antibiotics    Sulfonamide Derivatives    No current facility-administered medications on file prior to encounter.   Current Outpatient Medications on File Prior to Encounter  Medication Sig Dispense Refill   albuterol (VENTOLIN HFA) 108 (90 Base) MCG/ACT inhaler Inhale 1-2 puffs into the lungs every 6 (six) hours as needed for wheezing or shortness of breath.     Calcium Carbonate-Vit D-Min (CALCIUM 1200 PO) Take 1 tablet by mouth every evening.      cetirizine (ZYRTEC) 10 MG tablet Take 10 mg by mouth daily.     esomeprazole (NEXIUM) 40 MG capsule TAKE 1 CAPSULE DAILY 90 capsule 3   fluticasone (FLONASE) 50 MCG/ACT nasal spray USE 2 SPRAYS IN EACH NOSTRIL DAILY 48 g 3   metoprolol succinate (TOPROL-XL) 25 MG 24 hr tablet TAKE 1 TABLET DAILY 90 tablet 3   montelukast (SINGULAIR) 10 MG tablet TAKE 1 TABLET AT BEDTIME 90 tablet 3   potassium chloride (KLOR-CON) 10 MEQ tablet TAKE 1 TABLET DAILY 90 tablet 3   Social History   Socioeconomic History   Marital status: Married    Spouse name: Not on file   Number of children: Not on file   Years of education:  Not on file   Highest education level: Not on file  Occupational History   Not on file  Tobacco Use   Smoking status: Never   Smokeless tobacco: Never  Vaping Use   Vaping Use: Never used  Substance and Sexual Activity   Alcohol use: Yes    Alcohol/week: 7.0 standard drinks    Types: 7 Standard drinks or equivalent per week    Comment: glass of wine each day   Drug use: No   Sexual activity: Yes    Partners: Male    Birth control/protection: Surgical    Comment: HYST. 1st intercourse- 67, partners- 1, married- 75 yrs  Other Topics Concern   Not on file  Social History Narrative   Not on file   Social Determinants of Health   Financial Resource Strain: Not on file  Food Insecurity: Not on file   Transportation Needs: Not on file  Physical Activity: Not on file  Stress: Not on file  Social Connections: Not on file  Intimate Partner Violence: Not on file   Family History  Problem Relation Age of Onset   Hypertension Mother    Cancer Father 15       PANCREATIC   Other Other        bowel disease   Diabetes Other    Hypertension Other    Coronary artery disease Other    Heart disease Maternal Grandmother    Cancer Maternal Grandfather        COLON   Heart disease Paternal Grandmother    Heart disease Paternal Grandfather    Immunocompromised Brother    Breast cancer Neg Hx     OBJECTIVE:  Vitals:   09/19/20 1931  BP: (!) 157/82  Pulse: 69  Resp: 18  Temp: 98.2 F (36.8 C)  SpO2: 100%   General appearance: AOx3 in no acute distress HEENT: NCAT. Oropharynx clear.  Lungs: clear to auscultation bilaterally without adventitious breath sounds Heart: regular rate and rhythm. Radial pulses 2+ symmetrical bilaterally Abdomen: soft; non-distended; no tenderness; bowel sounds present; no guarding or rebound tenderness Back: no CVA tenderness Extremities: no edema; symmetrical with no gross deformities Skin: warm and dry Neurologic: Ambulates from chair to exam table without difficulty Psychological: alert and cooperative; normal mood and affect  Labs Reviewed  POCT URINALYSIS DIPSTICK, ED / UC - Abnormal; Notable for the following components:      Result Value   Hgb urine dipstick LARGE (*)    Protein, ur 100 (*)    Leukocytes,Ua LARGE (*)    All other components within normal limits  URINE CULTURE    ASSESSMENT & PLAN:  1. Lower urinary tract infectious disease     Meds ordered this encounter  Medications   cephALEXin (KEFLEX) 500 MG capsule    Sig: Take 1 capsule (500 mg total) by mouth 2 (two) times daily for 7 days.    Dispense:  14 capsule    Refill:  0    Order Specific Question:   Supervising Provider    Answer:   Chase Picket [id number]     Urine culture sent Push fluids and get plenty of rest Take antibiotic as directed and to completion May take AZO, cranberry pills, or pyridium as needed for symptom relief.  Follow up with PCP if symptoms persists Return here or go to ER if you have any new or worsening symptoms such as fever, worsening abdominal pain, nausea/vomiting, flank pain  Outlined signs and symptoms indicating  need for more acute intervention Patient verbalized understanding After Visit Summary given      Pearson Forster, NP 09/19/20 1953

## 2020-09-19 NOTE — ED Triage Notes (Signed)
Pt reports UTI Sx;s since last night.

## 2020-09-22 LAB — URINE CULTURE: Culture: 60000 — AB

## 2020-09-25 ENCOUNTER — Other Ambulatory Visit: Payer: Self-pay

## 2020-09-25 ENCOUNTER — Ambulatory Visit: Payer: BC Managed Care – PPO | Admitting: Family Medicine

## 2020-09-25 ENCOUNTER — Encounter: Payer: Self-pay | Admitting: Family Medicine

## 2020-09-25 VITALS — BP 130/80 | HR 81 | Temp 98.3°F | Wt 213.0 lb

## 2020-09-25 DIAGNOSIS — R319 Hematuria, unspecified: Secondary | ICD-10-CM

## 2020-09-25 DIAGNOSIS — N390 Urinary tract infection, site not specified: Secondary | ICD-10-CM | POA: Diagnosis not present

## 2020-09-25 DIAGNOSIS — M549 Dorsalgia, unspecified: Secondary | ICD-10-CM

## 2020-09-25 LAB — POC URINALSYSI DIPSTICK (AUTOMATED)
Bilirubin, UA: NEGATIVE
Blood, UA: NEGATIVE
Glucose, UA: NEGATIVE
Ketones, UA: NEGATIVE
Leukocytes, UA: NEGATIVE
Nitrite, UA: NEGATIVE
Protein, UA: NEGATIVE
Spec Grav, UA: 1.02 (ref 1.010–1.025)
Urobilinogen, UA: 0.2 E.U./dL
pH, UA: 6 (ref 5.0–8.0)

## 2020-09-25 MED ORDER — FLUCONAZOLE 150 MG PO TABS: 150.0000 mg | ORAL_TABLET | Freq: Once | ORAL | 5 refills | Status: AC

## 2020-09-25 MED ORDER — CIPROFLOXACIN HCL 500 MG PO TABS: 500.0000 mg | ORAL_TABLET | Freq: Two times a day (BID) | ORAL | 0 refills | Status: DC

## 2020-09-25 NOTE — Progress Notes (Signed)
   Subjective:    Patient ID: Olivia Carr, female    DOB: 08/20/1960, 60 y.o.   MRN: [id number]  HPI Here to follow up a visit to urgent care on 09-19-20 for urinary urgency, burning, and blood in the urine. No fever. She was found to have a pan-sensitive E coli UTI, and she was treated with Keflex. Since then she has not really gotten better, plus she has some middle back pain and nausea now. She is drinking lot of water.    Review of Systems  Constitutional: Negative.   Respiratory: Negative.    Cardiovascular: Negative.   Gastrointestinal:  Positive for nausea. Negative for abdominal distention, abdominal pain, blood in stool, constipation, diarrhea and vomiting.  Genitourinary:  Positive for dysuria, flank pain, frequency, hematuria and urgency.  Musculoskeletal:  Positive for back pain.      Objective:   Physical Exam Constitutional:      Appearance: Normal appearance. She is not ill-appearing.  Cardiovascular:     Rate and Rhythm: Normal rate and regular rhythm.     Pulses: Normal pulses.     Heart sounds: Normal heart sounds.  Pulmonary:     Effort: Pulmonary effort is normal.     Breath sounds: Normal breath sounds.  Abdominal:     General: Abdomen is flat. Bowel sounds are normal. There is no distension.     Palpations: Abdomen is soft. There is no mass.     Tenderness: There is no abdominal tenderness. There is no right CVA tenderness, left CVA tenderness, guarding or rebound.     Hernia: No hernia is present.  Neurological:     Mental Status: She is alert.          Assessment & Plan:  She has a partially treated UTI, possibly a mild pyelonephritis. We will stop the Keflex and start on Cipro BID for 10 days. She will report back in a few days. We spent 35 minutes reviewing records and discussing these issues.  Alysia Penna, MD

## 2020-10-18 ENCOUNTER — Telehealth (INDEPENDENT_AMBULATORY_CARE_PROVIDER_SITE_OTHER): Payer: BC Managed Care – PPO | Admitting: Family Medicine

## 2020-10-18 ENCOUNTER — Encounter: Payer: Self-pay | Admitting: Family Medicine

## 2020-10-18 VITALS — Temp 98.6°F

## 2020-10-18 DIAGNOSIS — U071 COVID-19: Secondary | ICD-10-CM | POA: Diagnosis not present

## 2020-10-18 NOTE — Progress Notes (Signed)
Subjective:    Patient ID: Olivia Carr, female    DOB: October 22, 1960, 60 y.o.   MRN: [id number]  HPI Here for a Covid-19 infection. Last week she and her husband were with a group of friends who travelled to [place]. They got back home 5 days ago, and then 4 days ago they both developed symptoms. Olivia Carr has had fatigue, headache, ST, and a dry cough. No fever or body aches. No chest pain or SOB. No NVD. She and her husband tested positive for the Covid virus 3 days ago. They have been quarantining at home. Today she feels much better, and she thinks she is over the worst part of it. She has not had to use her inhaler at all. She is taking Mucinex BID.  Virtual Visit via Video Note  I connected with the patient on 10/18/20 at 10:45 AM EDT by a video enabled telemedicine application and verified that I am speaking with the correct person using two identifiers.  Location patient: home Location provider:work or home office Persons participating in the virtual visit: patient, provider  I discussed the limitations of evaluation and management by telemedicine and the availability of in person appointments. The patient expressed understanding and agreed to proceed.   HPI:    ROS: See pertinent positives and negatives per HPI.  Past Medical History:  Diagnosis Date   Allergic rhinitis    sees Dr. Donneta Romberg    Asthma    sees Dr. Donneta Romberg    Benign tumor of pineal gland [facility])     last MRI 10-10-09, stable, no follow up planned   DUB (dysfunctional uterine bleeding)    GERD (gastroesophageal reflux disease)    History of bone density study 03-22-13   normal    Hypertension    NSVD (normal spontaneous vaginal delivery)    X2   Pneumonia    Routine gynecological examination    sees Dr. Uvaldo Rising     Past Surgical History:  Procedure Laterality Date   ABDOMINAL HYSTERECTOMY     LAVH   ANTERIOR CRUCIATE LIGAMENT REPAIR     right knee with medial and lateral  meniscectomies 09/04/08   COLONOSCOPY  12/2017   per Dr. Mary Sella Sadie Haber), precancerous polyps, repeat in 3 yrs    DILATION AND CURETTAGE OF UTERUS     x 2   KNEE ARTHROSCOPY Left 2018   LIGAMENT REPAIR Left 08/24/2019   Procedure: LIGAMENT REPAIR;  Surgeon: Hiram Gash, MD;  Location: [place];  Service: Orthopedics;  Laterality: Left;   MYOMECTOMY ABDOMINAL APPROACH     RADIAL HEAD ARTHROPLASTY Left 08/24/2019   Procedure: RADIAL HEAD ARTHROPLASTY;  Surgeon: Hiram Gash, MD;  Location: [place];  Service: Orthopedics;  Laterality: Left;   TONSILLECTOMY     WISDOM TOOTH EXTRACTION      Family History  Problem Relation Age of Onset   Hypertension Mother    Cancer Father 51       PANCREATIC   Other Other        bowel disease   Diabetes Other    Hypertension Other    Coronary artery disease Other    Heart disease Maternal Grandmother    Cancer Maternal Grandfather        COLON   Heart disease Paternal Grandmother    Heart disease Paternal Grandfather    Immunocompromised Brother    Breast cancer Neg Hx      Current Outpatient Medications:  albuterol (VENTOLIN HFA) 108 (90 Base) MCG/ACT inhaler, Inhale 1-2 puffs into the lungs every 6 (six) hours as needed for wheezing or shortness of breath., Disp: , Rfl:    Calcium Carbonate-Vit D-Min (CALCIUM 1200 PO), Take 1 tablet by mouth every evening. , Disp: , Rfl:    cetirizine (ZYRTEC) 10 MG tablet, Take 10 mg by mouth daily., Disp: , Rfl:    esomeprazole (NEXIUM) 40 MG capsule, TAKE 1 CAPSULE DAILY, Disp: 90 capsule, Rfl: 3   fluticasone (FLONASE) 50 MCG/ACT nasal spray, USE 2 SPRAYS IN EACH NOSTRIL DAILY, Disp: 48 g, Rfl: 3   metoprolol succinate (TOPROL-XL) 25 MG 24 hr tablet, TAKE 1 TABLET DAILY, Disp: 90 tablet, Rfl: 3   montelukast (SINGULAIR) 10 MG tablet, TAKE 1 TABLET AT BEDTIME, Disp: 90 tablet, Rfl: 3   potassium chloride (KLOR-CON) 10 MEQ tablet, TAKE 1 TABLET DAILY, Disp: 90 tablet,  Rfl: 3  EXAM:  VITALS per patient if applicable:  GENERAL: alert, oriented, appears well and in no acute distress  HEENT: atraumatic, conjunttiva clear, no obvious abnormalities on inspection of external nose and ears  NECK: normal movements of the head and neck  LUNGS: on inspection no signs of respiratory distress, breathing rate appears normal, no obvious gross SOB, gasping or wheezing  CV: no obvious cyanosis  MS: moves all visible extremities without noticeable abnormality  PSYCH/NEURO: pleasant and cooperative, no obvious depression or anxiety, speech and thought processing grossly intact  ASSESSMENT AND PLAN: Covid-19 infection. She seems to be getting over it, so we agreed that she would not take any of the antiviral medications. She will follow up as needed.  Alysia Penna, MD  Discussed the following assessment and plan:  No diagnosis found.     I discussed the assessment and treatment plan with the patient. The patient was provided an opportunity to ask questions and all were answered. The patient agreed with the plan and demonstrated an understanding of the instructions.   The patient was advised to call back or seek an in-person evaluation if the symptoms worsen or if the condition fails to improve as anticipated.     Review of Systems     Objective:   Physical Exam        Assessment & Plan:

## 2020-10-28 ENCOUNTER — Other Ambulatory Visit: Payer: Self-pay | Admitting: Family Medicine

## 2020-11-16 ENCOUNTER — Other Ambulatory Visit: Payer: Self-pay

## 2020-11-16 ENCOUNTER — Ambulatory Visit
Admission: RE | Admit: 2020-11-16 | Discharge: 2020-11-16 | Disposition: A | Discharge status: Home / Self Care | Payer: BC Managed Care – PPO | Source: Ambulatory Visit | Attending: Family Medicine | Admitting: Family Medicine

## 2020-11-16 DIAGNOSIS — R928 Other abnormal and inconclusive findings on diagnostic imaging of breast: Secondary | ICD-10-CM

## 2020-12-28 ENCOUNTER — Ambulatory Visit (INDEPENDENT_AMBULATORY_CARE_PROVIDER_SITE_OTHER): Payer: BC Managed Care – PPO | Admitting: Family Medicine

## 2020-12-28 ENCOUNTER — Encounter: Payer: Self-pay | Admitting: Family Medicine

## 2020-12-28 VITALS — BP 140/88 | HR 61 | Temp 98.4°F | Ht 64.5 in | Wt 214.0 lb

## 2020-12-28 DIAGNOSIS — Z0000 Encounter for general adult medical examination without abnormal findings: Secondary | ICD-10-CM | POA: Diagnosis not present

## 2020-12-28 LAB — LIPID PANEL
Cholesterol: 177 mg/dL (ref 0–200)
HDL: 54.5 mg/dL (ref >39.00)
LDL Cholesterol: 103 mg/dL — ABNORMAL HIGH (ref 0–99)
NonHDL: 122.76
Total CHOL/HDL Ratio: 3
Triglycerides: 98 mg/dL (ref 0.0–149.0)
VLDL: 19.6 mg/dL (ref 0.0–40.0)

## 2020-12-28 LAB — BASIC METABOLIC PANEL
BUN: 13 mg/dL (ref 6–23)
CO2: 28 mEq/L (ref 19–32)
Calcium: 9 mg/dL (ref 8.4–10.5)
Chloride: 105 mEq/L (ref 96–112)
Creatinine, Ser: 0.74 mg/dL (ref 0.40–1.20)
GFR: 89.32 mL/min (ref >60.00)
Glucose, Bld: 104 mg/dL — ABNORMAL HIGH (ref 70–99)
Potassium: 4.4 mEq/L (ref 3.5–5.1)
Sodium: 140 mEq/L (ref 135–145)

## 2020-12-28 LAB — HEPATIC FUNCTION PANEL
ALT: 16 U/L (ref 0–35)
AST: 18 U/L (ref 0–37)
Albumin: 3.9 g/dL (ref 3.5–5.2)
Alkaline Phosphatase: 65 U/L (ref 39–117)
Bilirubin, Direct: 0.1 mg/dL (ref 0.0–0.3)
Total Bilirubin: 0.6 mg/dL (ref 0.2–1.2)
Total Protein: 6.8 g/dL (ref 6.0–8.3)

## 2020-12-28 LAB — CBC WITH DIFFERENTIAL/PLATELET
Basophils Absolute: 0.1 10*3/uL (ref 0.0–0.1)
Basophils Relative: 1.5 % (ref 0.0–3.0)
Eosinophils Absolute: 0.2 10*3/uL (ref 0.0–0.7)
Eosinophils Relative: 4.2 % (ref 0.0–5.0)
HCT: 38 % (ref 36.0–46.0)
Hemoglobin: 12.1 g/dL (ref 12.0–15.0)
Lymphocytes Relative: 26.4 % (ref 12.0–46.0)
Lymphs Abs: 1.5 10*3/uL (ref 0.7–4.0)
MCHC: 31.8 g/dL (ref 30.0–36.0)
MCV: 74.5 fl — ABNORMAL LOW (ref 78.0–100.0)
Monocytes Absolute: 0.5 10*3/uL (ref 0.1–1.0)
Monocytes Relative: 8.3 % (ref 3.0–12.0)
Neutro Abs: 3.3 10*3/uL (ref 1.4–7.7)
Neutrophils Relative %: 59.6 % (ref 43.0–77.0)
Platelets: 274 10*3/uL (ref 150.0–400.0)
RBC: 5.09 Mil/uL (ref 3.87–5.11)
RDW: 19.5 % — ABNORMAL HIGH (ref 11.5–15.5)
WBC: 5.5 10*3/uL (ref 4.0–10.5)

## 2020-12-28 LAB — TSH: TSH: 1.42 u[IU]/mL (ref 0.35–5.50)

## 2020-12-28 LAB — HEMOGLOBIN A1C: Hgb A1c MFr Bld: 6 % (ref 4.6–6.5)

## 2020-12-28 MED ORDER — POTASSIUM CHLORIDE CRYS ER 10 MEQ PO TBCR: 10.0000 meq | EXTENDED_RELEASE_TABLET | Freq: Every day | ORAL | 3 refills | Status: DC

## 2020-12-28 MED ORDER — METOPROLOL SUCCINATE ER 25 MG PO TB24: 25.0000 mg | ORAL_TABLET | Freq: Every day | ORAL | 3 refills | Status: DC

## 2020-12-28 NOTE — Progress Notes (Signed)
   Subjective:    Patient ID: Olivia Carr, female    DOB: 01/17/1961, 60 y.o.   MRN: [id number]  HPI Here for a well exam. She feels fine.    Review of Systems  Constitutional: Negative.   HENT: Negative.    Eyes: Negative.   Respiratory: Negative.    Cardiovascular: Negative.   Gastrointestinal: Negative.   Genitourinary:  Negative for decreased urine volume, difficulty urinating, dyspareunia, dysuria, enuresis, flank pain, frequency, hematuria, pelvic pain and urgency.  Musculoskeletal: Negative.   Skin: Negative.   Neurological: Negative.  Negative for headaches.  Psychiatric/Behavioral: Negative.        Objective:   Physical Exam Constitutional:      General: She is not in acute distress.    Appearance: Normal appearance. She is well-developed.  HENT:     Head: Normocephalic and atraumatic.     Right Ear: External ear normal.     Left Ear: External ear normal.     Nose: Nose normal.     Mouth/Throat:     Pharynx: No oropharyngeal exudate.  Eyes:     General: No scleral icterus.    Conjunctiva/sclera: Conjunctivae normal.     Pupils: Pupils are equal, round, and reactive to light.  Neck:     Thyroid: No thyromegaly.     Vascular: No JVD.  Cardiovascular:     Rate and Rhythm: Normal rate and regular rhythm.     Heart sounds: Normal heart sounds. No murmur heard.   No friction rub. No gallop.  Pulmonary:     Effort: Pulmonary effort is normal. No respiratory distress.     Breath sounds: Normal breath sounds. No wheezing or rales.  Chest:     Chest wall: No tenderness.  Abdominal:     General: Bowel sounds are normal. There is no distension.     Palpations: Abdomen is soft. There is no mass.     Tenderness: There is no abdominal tenderness. There is no guarding or rebound.  Musculoskeletal:        General: No tenderness. Normal range of motion.     Cervical back: Normal range of motion and neck supple.  Lymphadenopathy:     Cervical: No cervical  adenopathy.  Skin:    General: Skin is warm and dry.     Findings: No erythema or rash.  Neurological:     Mental Status: She is alert and oriented to person, place, and time.     Cranial Nerves: No cranial nerve deficit.     Motor: No abnormal muscle tone.     Coordination: Coordination normal.     Deep Tendon Reflexes: Reflexes are normal and symmetric. Reflexes normal.  Psychiatric:        Behavior: Behavior normal.        Thought Content: Thought content normal.        Judgment: Judgment normal.          Assessment & Plan:  Well exam. We discussed diet and exercise. Get fasting labs. She will see Dr. Mary Sella, her GI, next week for the colonoscopy.  Alysia Penna, MD

## 2021-03-02 ENCOUNTER — Other Ambulatory Visit: Payer: Self-pay | Admitting: Family Medicine

## 2021-03-12 ENCOUNTER — Other Ambulatory Visit: Payer: Self-pay | Admitting: Family Medicine

## 2021-03-12 DIAGNOSIS — Z1231 Encounter for screening mammogram for malignant neoplasm of breast: Secondary | ICD-10-CM

## 2021-03-20 ENCOUNTER — Ambulatory Visit: Payer: BC Managed Care – PPO | Admitting: Obstetrics & Gynecology

## 2021-04-26 ENCOUNTER — Ambulatory Visit
Admission: RE | Admit: 2021-04-26 | Discharge: 2021-04-26 | Disposition: A | Discharge status: Home / Self Care | Payer: BC Managed Care – PPO | Source: Ambulatory Visit | Attending: Family Medicine | Admitting: Family Medicine

## 2021-04-26 DIAGNOSIS — Z1231 Encounter for screening mammogram for malignant neoplasm of breast: Secondary | ICD-10-CM

## 2021-05-06 HISTORY — PX: COLONOSCOPY: SHX174

## 2021-05-16 ENCOUNTER — Other Ambulatory Visit: Payer: Self-pay | Admitting: Family Medicine

## 2021-05-20 ENCOUNTER — Ambulatory Visit (INDEPENDENT_AMBULATORY_CARE_PROVIDER_SITE_OTHER): Payer: BC Managed Care – PPO | Admitting: Obstetrics & Gynecology

## 2021-05-20 ENCOUNTER — Encounter: Payer: Self-pay | Admitting: Obstetrics & Gynecology

## 2021-05-20 VITALS — BP 110/74 | HR 72 | Resp 16 | Ht 64.25 in | Wt 212.0 lb

## 2021-05-20 DIAGNOSIS — Z90710 Acquired absence of both cervix and uterus: Secondary | ICD-10-CM | POA: Diagnosis not present

## 2021-05-20 DIAGNOSIS — Z01419 Encounter for gynecological examination (general) (routine) without abnormal findings: Secondary | ICD-10-CM | POA: Diagnosis not present

## 2021-05-20 DIAGNOSIS — E6609 Other obesity due to excess calories: Secondary | ICD-10-CM | POA: Diagnosis not present

## 2021-05-20 DIAGNOSIS — Z780 Asymptomatic menopausal state: Secondary | ICD-10-CM | POA: Diagnosis not present

## 2021-05-20 DIAGNOSIS — Z6836 Body mass index (BMI) 36.0-36.9, adult: Secondary | ICD-10-CM

## 2021-05-20 NOTE — Progress Notes (Signed)
? ? ?Olivia Carr [birth year] [id number] ? ? ?History:    61 y.o.  G3P2A1L2 Married.  Daughters 52 and 8 yo.  1st grand-daughter 10/2019. ?  ?RP:  Established patient presenting for annual gyn exam  ?  ?HPI: S/P LAVH.  Postmenopause, well on no hormone replacement therapy.  No pelvic pain.  No pain with intercourse. Pap Neg 02/2020.  Breasts normal. Mammo Neg 03/2021.  Body mass index 36.11.  BD Normal 03/2015.  Health labs with Dr. Sharlene Motts.  Colonoscopy 04/2021. ? ? ?Past medical history,surgical history, family history and social history were all reviewed and documented in the EPIC chart. ? ?Gynecologic History ?No LMP recorded. Patient has had a hysterectomy. ? ?Obstetric History ?OB History  ?Gravida Para Term Preterm AB Living  ?'3 2 2   1 2  '$ ?SAB IAB Ectopic Multiple Live Births  ?1       2  ?  ?# Outcome Date GA Lbr Len/2nd Weight Sex Delivery Anes PTL Lv  ?3 SAB           ?2 Term     F Vag-Spont  N LIV  ?1 Term     F Vag-Spont  N LIV  ? ? ? ?ROS: A ROS was performed and pertinent positives and negatives are included in the history. ? GENERAL: No fevers or chills. HEENT: No change in vision, no earache, sore throat or sinus congestion. NECK: No pain or stiffness. CARDIOVASCULAR: No chest pain or pressure. No palpitations. PULMONARY: No shortness of breath, cough or wheeze. GASTROINTESTINAL: No abdominal pain, nausea, vomiting or diarrhea, melena or bright red blood per rectum. GENITOURINARY: No urinary frequency, urgency, hesitancy or dysuria. MUSCULOSKELETAL: No joint or muscle pain, no back pain, no recent trauma. DERMATOLOGIC: No rash, no itching, no lesions. ENDOCRINE: No polyuria, polydipsia, no heat or cold intolerance. No recent change in weight. HEMATOLOGICAL: No anemia or easy bruising or bleeding. NEUROLOGIC: No headache, seizures, numbness, tingling or weakness. PSYCHIATRIC: No depression, no loss of interest in normal activity or change in sleep pattern.  ?  ? ?Exam: ? ? ?BP 110/74   Pulse  72   Resp 16   Ht 5' 4.25" (1.632 m)   Wt 212 lb (96.2 kg)   BMI 36.11 kg/m?  ? ?Body mass index is 36.11 kg/m?. ? ?General appearance : Well developed well nourished female. No acute distress ?HEENT: Eyes: no retinal hemorrhage or exudates,  Neck supple, trachea midline, no carotid bruits, no thyroidmegaly ?Lungs: Clear to auscultation, no rhonchi or wheezes, or rib retractions  ?Heart: Regular rate and rhythm, no murmurs or gallops ?Breast:Examined in sitting and supine position were symmetrical in appearance, no palpable masses or tenderness,  no skin retraction, no nipple inversion, no nipple discharge, no skin discoloration, no axillary or supraclavicular lymphadenopathy ?Abdomen: no palpable masses or tenderness, no rebound or guarding ?Extremities: no edema or skin discoloration or tenderness ? ?Pelvic: Vulva: Normal ?            Vagina: No gross lesions or discharge ? Cervix/Uterus absent ? Adnexa  Without masses or tenderness ? Anus: Normal ? ? ?Assessment/Plan:  61 y.o. female for annual exam  ? ?1. Well female exam with routine gynecological exam ?S/P LAVH.  Postmenopause, well on no hormone replacement therapy.  No pelvic pain.  No pain with intercourse. Pap Neg 02/2020.  Breasts normal. Mammo Neg 03/2021.  Body mass index 36.11. BD Normal 03/2015.  Health labs with Dr. Sharlene Motts.  Colonoscopy 04/2021. ? ?2.  S/P laparoscopic assisted vaginal hysterectomy (LAVH) ? ?3. Postmenopausal ?S/P LAVH.  Postmenopause, well on no hormone replacement therapy.  No pelvic pain.  No pain with intercourse. ? ?4. Class 2 obesity due to excess calories without serious comorbidity with body mass index (BMI) of 36.0 to 36.9 in adult  ?Low calorie/carb diet.  Increase fitness activities. ? ?Princess Bruins MD, 8:50 AM 05/20/2021 ? ?  ?

## 2021-07-17 ENCOUNTER — Other Ambulatory Visit: Payer: Self-pay | Admitting: Family Medicine

## 2021-08-14 ENCOUNTER — Other Ambulatory Visit: Payer: Self-pay | Admitting: Family Medicine

## 2021-10-02 ENCOUNTER — Other Ambulatory Visit: Payer: Self-pay | Admitting: Family Medicine

## 2021-12-09 ENCOUNTER — Ambulatory Visit: Payer: BC Managed Care – PPO | Admitting: Family Medicine

## 2021-12-09 ENCOUNTER — Encounter: Payer: Self-pay | Admitting: Family Medicine

## 2021-12-09 VITALS — BP 120/80 | HR 65 | Temp 98.2°F | Ht 64.5 in | Wt 204.0 lb

## 2021-12-09 DIAGNOSIS — J0110 Acute frontal sinusitis, unspecified: Secondary | ICD-10-CM

## 2021-12-09 MED ORDER — AZITHROMYCIN 250 MG PO TABS: ORAL_TABLET | ORAL | 0 refills | Status: AC

## 2021-12-09 NOTE — Progress Notes (Signed)
Established Patient Office Visit  Subjective   Patient ID: Olivia Carr, female    DOB: 1960/11/18  Age: 61 y.o. MRN: [id number]  Chief Complaint  Patient presents with   Cough   Sinus Problem    HPI   Olivia Carr is seen with productive cough past couple weeks with some progressive left frontal sinus pressure and pain.  She just got back from a trip to [place] recently.  She states she had food poisoning there.  Symptoms started over 2 weeks ago.  Denies any fever.  She has tried [place] and [employer] over-the-counter without relief. No dyspnea.  Some maiaise.   Past Medical History:  Diagnosis Date   Allergic rhinitis    sees Dr. Donneta Romberg    Asthma    sees Dr. Donneta Romberg    Benign tumor of pineal gland [facility])     last MRI 10-10-09, stable, no follow up planned   DUB (dysfunctional uterine bleeding)    GERD (gastroesophageal reflux disease)    History of bone density study 03-22-13   normal    Hypertension    NSVD (normal spontaneous vaginal delivery)    X2   Pneumonia    Routine gynecological examination    sees Dr. Uvaldo Rising    Past Surgical History:  Procedure Laterality Date   ABDOMINAL HYSTERECTOMY     LAVH   ANTERIOR CRUCIATE LIGAMENT REPAIR     right knee with medial and lateral meniscectomies 09/04/08   COLONOSCOPY  12/2017   per Dr. Mary Sella Sadie Haber), precancerous polyps, repeat in 3 yrs    DILATION AND CURETTAGE OF UTERUS     x 2   KNEE ARTHROSCOPY Left 2018   LIGAMENT REPAIR Left 08/24/2019   Procedure: LIGAMENT REPAIR;  Surgeon: Hiram Gash, MD;  Location: [facility];  Service: Orthopedics;  Laterality: Left;   MYOMECTOMY ABDOMINAL APPROACH     RADIAL HEAD ARTHROPLASTY Left 08/24/2019   Procedure: RADIAL HEAD ARTHROPLASTY;  Surgeon: Hiram Gash, MD;  Location: [facility];  Service: Orthopedics;  Laterality: Left;   TONSILLECTOMY     WISDOM TOOTH EXTRACTION      reports that she has never smoked. She has never used  smokeless tobacco. She reports current alcohol use of about 7.0 standard drinks of alcohol per week. She reports that she does not use drugs. family history includes Cancer in her maternal grandfather; Cancer (age of onset: 47) in her father; Coronary artery disease in an other family member; Diabetes in an other family member; Heart disease in her maternal grandmother, paternal grandfather, and paternal grandmother; Hypertension in her mother and another family member; Immunocompromised in her brother; Other in an other family member. Allergies  Allergen Reactions   Nitrofurantoin    Other     Compazine Uncontrolled eye & tongue movements   Sulfa Antibiotics     Review of Systems  Constitutional:  Negative for chills and fever.  HENT:  Positive for congestion and sinus pain.   Neurological:  Positive for headaches.      Objective:     BP 120/80   Carr 65   Temp 98.2 F (36.8 C) (Oral)   Ht 5' 4.5" (1.638 m)   Wt 204 lb (92.5 kg)   SpO2 98%   BMI 34.48 kg/m    Physical Exam Vitals reviewed.  Constitutional:      Appearance: Normal appearance.  HENT:     Right Ear: Tympanic membrane normal.  Left Ear: Tympanic membrane normal.  Cardiovascular:     Rate and Rhythm: Normal rate and regular rhythm.  Pulmonary:     Effort: Pulmonary effort is normal.     Breath sounds: Normal breath sounds. No wheezing or rales.  Neurological:     Mental Status: She is alert.      No results found for any visits on 12/09/21.    The 10-year ASCVD risk score (Arnett DK, et al., 2019) is: 3.2%    Assessment & Plan:   Acute left frontal sinusitis  -She states she has responded well to Zithromax in the past.  We prescribe Zithromax for 5 days.  Continue Sudafed daytime use.  Stay well-hydrated. -Follow-up for any persistent or worsening symptoms.  No follow-ups on file.    Carolann Littler, MD

## 2021-12-26 ENCOUNTER — Other Ambulatory Visit: Payer: Self-pay | Admitting: Family Medicine

## 2021-12-31 ENCOUNTER — Ambulatory Visit (INDEPENDENT_AMBULATORY_CARE_PROVIDER_SITE_OTHER): Payer: BC Managed Care – PPO | Admitting: Family Medicine

## 2021-12-31 ENCOUNTER — Encounter: Payer: Self-pay | Admitting: Family Medicine

## 2021-12-31 VITALS — BP 130/86 | HR 56 | Temp 98.1°F | Ht 64.0 in | Wt 205.0 lb

## 2021-12-31 DIAGNOSIS — Z0000 Encounter for general adult medical examination without abnormal findings: Secondary | ICD-10-CM

## 2021-12-31 LAB — BASIC METABOLIC PANEL
BUN: 13 mg/dL (ref 6–23)
CO2: 28 mEq/L (ref 19–32)
Calcium: 9.2 mg/dL (ref 8.4–10.5)
Chloride: 106 mEq/L (ref 96–112)
Creatinine, Ser: 0.78 mg/dL (ref 0.40–1.20)
GFR: 83.26 mL/min (ref >60.00)
Glucose, Bld: 110 mg/dL — ABNORMAL HIGH (ref 70–99)
Potassium: 4.2 mEq/L (ref 3.5–5.1)
Sodium: 141 mEq/L (ref 135–145)

## 2021-12-31 LAB — CBC WITH DIFFERENTIAL/PLATELET
Basophils Absolute: 0.1 10*3/uL (ref 0.0–0.1)
Basophils Relative: 1.1 % (ref 0.0–3.0)
Eosinophils Absolute: 0.2 10*3/uL (ref 0.0–0.7)
Eosinophils Relative: 3.1 % (ref 0.0–5.0)
HCT: 41.2 % (ref 36.0–46.0)
Hemoglobin: 13.5 g/dL (ref 12.0–15.0)
Lymphocytes Relative: 29.7 % (ref 12.0–46.0)
Lymphs Abs: 1.6 10*3/uL (ref 0.7–4.0)
MCHC: 32.8 g/dL (ref 30.0–36.0)
MCV: 79.9 fl (ref 78.0–100.0)
Monocytes Absolute: 0.5 10*3/uL (ref 0.1–1.0)
Monocytes Relative: 9.3 % (ref 3.0–12.0)
Neutro Abs: 3 10*3/uL (ref 1.4–7.7)
Neutrophils Relative %: 56.8 % (ref 43.0–77.0)
Platelets: 293 10*3/uL (ref 150.0–400.0)
RBC: 5.16 Mil/uL — ABNORMAL HIGH (ref 3.87–5.11)
RDW: 16.6 % — ABNORMAL HIGH (ref 11.5–15.5)
WBC: 5.3 10*3/uL (ref 4.0–10.5)

## 2021-12-31 LAB — HEPATIC FUNCTION PANEL
ALT: 13 U/L (ref 0–35)
AST: 14 U/L (ref 0–37)
Albumin: 3.9 g/dL (ref 3.5–5.2)
Alkaline Phosphatase: 67 U/L (ref 39–117)
Bilirubin, Direct: 0.1 mg/dL (ref 0.0–0.3)
Total Bilirubin: 0.9 mg/dL (ref 0.2–1.2)
Total Protein: 6.8 g/dL (ref 6.0–8.3)

## 2021-12-31 LAB — LIPID PANEL
Cholesterol: 179 mg/dL (ref 0–200)
HDL: 58.3 mg/dL (ref >39.00)
LDL Cholesterol: 99 mg/dL (ref 0–99)
NonHDL: 120.24
Total CHOL/HDL Ratio: 3
Triglycerides: 105 mg/dL (ref 0.0–149.0)
VLDL: 21 mg/dL (ref 0.0–40.0)

## 2021-12-31 LAB — HEMOGLOBIN A1C: Hgb A1c MFr Bld: 6.1 % (ref 4.6–6.5)

## 2021-12-31 LAB — TSH: TSH: 1.29 u[IU]/mL (ref 0.35–5.50)

## 2021-12-31 MED ORDER — ESOMEPRAZOLE MAGNESIUM 40 MG PO CPDR: 40.0000 mg | DELAYED_RELEASE_CAPSULE | Freq: Every day | ORAL | 3 refills | Status: DC

## 2021-12-31 MED ORDER — POTASSIUM CHLORIDE CRYS ER 10 MEQ PO TBCR: 10.0000 meq | EXTENDED_RELEASE_TABLET | Freq: Every day | ORAL | 3 refills | Status: DC

## 2021-12-31 MED ORDER — METOPROLOL SUCCINATE ER 25 MG PO TB24: 25.0000 mg | ORAL_TABLET | Freq: Every day | ORAL | 3 refills | Status: DC

## 2021-12-31 MED ORDER — MONTELUKAST SODIUM 10 MG PO TABS: 10.0000 mg | ORAL_TABLET | Freq: Every day | ORAL | 3 refills | Status: DC

## 2021-12-31 NOTE — Progress Notes (Signed)
Subjective:    Patient ID: Olivia Carr, female    DOB: 1960/10/22, 61 y.o.   MRN: [id number]  HPI Here for a well exam. She feels well but she asks [place] to check a tender spot on her back that appeared suddenly last week.    Review of Systems  Constitutional: Negative.   HENT: Negative.    Eyes: Negative.   Respiratory: Negative.    Cardiovascular: Negative.   Gastrointestinal: Negative.   Genitourinary:  Negative for decreased urine volume, difficulty urinating, dyspareunia, dysuria, enuresis, flank pain, frequency, hematuria, pelvic pain and urgency.  Musculoskeletal: Negative.   Skin:  Positive for color change.  Neurological: Negative.  Negative for headaches.  Psychiatric/Behavioral: Negative.         Objective:   Physical Exam Constitutional:      General: She is not in acute distress.    Appearance: Normal appearance. She is well-developed.  HENT:     Head: Normocephalic and atraumatic.     Right Ear: External ear normal.     Left Ear: External ear normal.     Nose: Nose normal.     Mouth/Throat:     Pharynx: No oropharyngeal exudate.  Eyes:     General: No scleral icterus.    Conjunctiva/sclera: Conjunctivae normal.     Pupils: Pupils are equal, round, and reactive to light.  Neck:     Thyroid: No thyromegaly.     Vascular: No JVD.  Cardiovascular:     Rate and Rhythm: Normal rate and regular rhythm.     Pulses: Normal pulses.     Heart sounds: Normal heart sounds. No murmur heard.    No friction rub. No gallop.  Pulmonary:     Effort: Pulmonary effort is normal. No respiratory distress.     Breath sounds: Normal breath sounds. No wheezing or rales.  Chest:     Chest wall: No tenderness.  Abdominal:     General: Bowel sounds are normal. There is no distension.     Palpations: Abdomen is soft. There is no mass.     Tenderness: There is no abdominal tenderness. There is no guarding or rebound.  Musculoskeletal:        General: No  tenderness. Normal range of motion.     Cervical back: Normal range of motion and neck supple.  Lymphadenopathy:     Cervical: No cervical adenopathy.  Skin:    General: Skin is warm and dry.     Comments: There is a macular erythematous spot on the left upper back which blanches and has irregular borders   Neurological:     General: No focal deficit present.     Mental Status: She is alert and oriented to person, place, and time.     Cranial Nerves: No cranial nerve deficit.     Motor: No abnormal muscle tone.     Coordination: Coordination normal.     Deep Tendon Reflexes: Reflexes are normal and symmetric. Reflexes normal.  Psychiatric:        Behavior: Behavior normal.        Thought Content: Thought content normal.        Judgment: Judgment normal.           Assessment & Plan:  Well exam. We discussed diet and exercise. Get fasting labs. She sees Dr. Wilhemina Bonito for dermatology care, and I asked her to make an appt for him to evaluate the spot on her back.  Alysia Penna, MD

## 2022-02-10 ENCOUNTER — Other Ambulatory Visit: Payer: Self-pay | Admitting: Family Medicine

## 2022-04-07 ENCOUNTER — Other Ambulatory Visit: Payer: Self-pay | Admitting: Family Medicine

## 2022-04-07 DIAGNOSIS — Z1231 Encounter for screening mammogram for malignant neoplasm of breast: Secondary | ICD-10-CM

## 2022-05-19 ENCOUNTER — Ambulatory Visit
Admission: RE | Admit: 2022-05-19 | Discharge: 2022-05-19 | Disposition: A | Discharge status: Home / Self Care | Payer: 59 | Source: Ambulatory Visit | Attending: Family Medicine | Admitting: Family Medicine

## 2022-05-19 DIAGNOSIS — Z1231 Encounter for screening mammogram for malignant neoplasm of breast: Secondary | ICD-10-CM

## 2022-05-22 ENCOUNTER — Encounter: Payer: Self-pay | Admitting: Obstetrics & Gynecology

## 2022-05-22 ENCOUNTER — Ambulatory Visit (INDEPENDENT_AMBULATORY_CARE_PROVIDER_SITE_OTHER): Payer: 59 | Admitting: Obstetrics & Gynecology

## 2022-05-22 VITALS — BP 120/76 | HR 68 | Ht 63.75 in | Wt 211.0 lb

## 2022-05-22 DIAGNOSIS — E6609 Other obesity due to excess calories: Secondary | ICD-10-CM

## 2022-05-22 DIAGNOSIS — Z01419 Encounter for gynecological examination (general) (routine) without abnormal findings: Secondary | ICD-10-CM

## 2022-05-22 DIAGNOSIS — Z90710 Acquired absence of both cervix and uterus: Secondary | ICD-10-CM

## 2022-05-22 DIAGNOSIS — Z780 Asymptomatic menopausal state: Secondary | ICD-10-CM | POA: Diagnosis not present

## 2022-05-22 NOTE — Progress Notes (Signed)
Arina Torry 1960/03/21 [id number]   History:    62 y.o. G3P2A1L2 Married.  Daughters 42 and 13 yo.  2 grand-children.   RP:  Established patient presenting for annual gyn exam    HPI: S/P LAVH.  Postmenopause, well on no hormone replacement therapy.  No pelvic pain.  No pain with intercourse. Pap Neg 02/2020. No indication to repeat Pap at this time. Breasts normal. Mammo Neg 04/2022.  Body mass index 36.5.  BD Normal 03/2015.  Health labs with Dr. Abran Cantor.  Colonoscopy 03/2021.   Past medical history,surgical history, family history and social history were all reviewed and documented in the EPIC chart.  Gynecologic History No LMP recorded. Patient has had a hysterectomy.  Obstetric History OB History  Gravida Para Term Preterm AB Living  SAB IAB Ectopic Multiple Live Births  1       2    # Outcome Date GA Lbr Len/2nd Weight Sex Delivery Anes PTL Lv  3 SAB           2 Term     F Vag-Spont  N LIV  1 Term     F Vag-Spont  N LIV     ROS: A ROS was performed and pertinent positives and negatives are included in the history. GENERAL: No fevers or chills. HEENT: No change in vision, no earache, sore throat or sinus congestion. NECK: No pain or stiffness. CARDIOVASCULAR: No chest pain or pressure. No palpitations. PULMONARY: No shortness of breath, cough or wheeze. GASTROINTESTINAL: No abdominal pain, nausea, vomiting or diarrhea, melena or bright red blood per rectum. GENITOURINARY: No urinary frequency, urgency, hesitancy or dysuria. MUSCULOSKELETAL: No joint or muscle pain, no back pain, no recent trauma. DERMATOLOGIC: No rash, no itching, no lesions. ENDOCRINE: No polyuria, polydipsia, no heat or cold intolerance. No recent change in weight. HEMATOLOGICAL: No anemia or easy bruising or bleeding. NEUROLOGIC: No headache, seizures, numbness, tingling or weakness. PSYCHIATRIC: No depression, no loss of interest in normal activity or change in sleep pattern.      Exam:   BP 120/76   Pulse 68   Ht 5' 3.75" (1.619 m)   Wt 211 lb (95.7 kg)   SpO2 97%   BMI 36.50 kg/m   Body mass index is 36.5 kg/m.  General appearance : Well developed well nourished female. No acute distress HEENT: Eyes: no retinal hemorrhage or exudates,  Neck supple, trachea midline, no carotid bruits, no thyroidmegaly Lungs: Clear to auscultation, no rhonchi or wheezes, or rib retractions  Heart: Regular rate and rhythm, no murmurs or gallops Breast:Examined in sitting and supine position were symmetrical in appearance, no palpable masses or tenderness,  no skin retraction, no nipple inversion, no nipple discharge, no skin discoloration, no axillary or supraclavicular lymphadenopathy Abdomen: no palpable masses or tenderness, no rebound or guarding Extremities: no edema or skin discoloration or tenderness  Pelvic: Vulva: Normal             Vagina: No gross lesions or discharge  Cervix/Uterus absent  Adnexa  Without masses or tenderness  Anus: Normal   Assessment/Plan:  62 y.o. female for annual exam   1. Well female exam with routine gynecological exam S/P LAVH.  Postmenopause, well on no hormone replacement therapy.  No pelvic pain.  No pain with intercourse. Pap Neg 02/2020. No indication to repeat Pap at this time. Breasts normal. Mammo Neg 04/2022.  Body mass index 36.5.  BD Normal 03/2015.  Health labs with Dr. Abran Cantor.  Colonoscopy 03/2021.  2. S/P laparoscopic assisted vaginal hysterectomy (LAVH)  3. Postmenopausal S/P LAVH.  Postmenopause, well on no hormone replacement therapy.  No pelvic pain.  No pain with intercourse.   4. Class 2 obesity due to excess calories without serious comorbidity with body mass index (BMI) of 36.0 to 36.9 in adult Body mass index 36.5.  Going to the Gym regularly.  Lower calorie/carb diet recommended.  Other orders - EPINEPHrine 0.3 mg/0.3 mL IJ SOAJ injection; SMARTSIG:1 Auto-Injector Injection PRN   Genia Del MD, 8:42 AM

## 2022-08-11 ENCOUNTER — Other Ambulatory Visit: Payer: Self-pay | Admitting: Family Medicine

## 2023-01-02 ENCOUNTER — Ambulatory Visit: Payer: 59 | Admitting: Family Medicine

## 2023-01-02 ENCOUNTER — Encounter: Payer: Self-pay | Admitting: Family Medicine

## 2023-01-02 VITALS — BP 124/80 | HR 64 | Temp 98.2°F | Ht 65.0 in | Wt 214.0 lb

## 2023-01-02 DIAGNOSIS — Z0000 Encounter for general adult medical examination without abnormal findings: Secondary | ICD-10-CM

## 2023-01-02 LAB — HEPATIC FUNCTION PANEL
ALT: 14 U/L (ref 0–35)
AST: 15 U/L (ref 0–37)
Albumin: 3.9 g/dL (ref 3.5–5.2)
Alkaline Phosphatase: 63 U/L (ref 39–117)
Bilirubin, Direct: 0.2 mg/dL (ref 0.0–0.3)
Total Bilirubin: 0.9 mg/dL (ref 0.2–1.2)
Total Protein: 6.6 g/dL (ref 6.0–8.3)

## 2023-01-02 LAB — BASIC METABOLIC PANEL
BUN: 17 mg/dL (ref 6–23)
CO2: 28 meq/L (ref 19–32)
Calcium: 9.2 mg/dL (ref 8.4–10.5)
Chloride: 105 meq/L (ref 96–112)
Creatinine, Ser: 0.75 mg/dL (ref 0.40–1.20)
GFR: 86.66 mL/min (ref >60.00)
Glucose, Bld: 106 mg/dL — ABNORMAL HIGH (ref 70–99)
Potassium: 4.1 meq/L (ref 3.5–5.1)
Sodium: 140 meq/L (ref 135–145)

## 2023-01-02 LAB — LIPID PANEL
Cholesterol: 184 mg/dL (ref 0–200)
HDL: 48.8 mg/dL (ref >39.00)
LDL Cholesterol: 109 mg/dL — ABNORMAL HIGH (ref 0–99)
NonHDL: 134.73
Total CHOL/HDL Ratio: 4
Triglycerides: 127 mg/dL (ref 0.0–149.0)
VLDL: 25.4 mg/dL (ref 0.0–40.0)

## 2023-01-02 LAB — CBC WITH DIFFERENTIAL/PLATELET
Basophils Absolute: 0.1 10*3/uL (ref 0.0–0.1)
Basophils Relative: 1 % (ref 0.0–3.0)
Eosinophils Absolute: 0.2 10*3/uL (ref 0.0–0.7)
Eosinophils Relative: 3 % (ref 0.0–5.0)
HCT: 40.8 % (ref 36.0–46.0)
Hemoglobin: 13.6 g/dL (ref 12.0–15.0)
Lymphocytes Relative: 28 % (ref 12.0–46.0)
Lymphs Abs: 1.8 10*3/uL (ref 0.7–4.0)
MCHC: 33.3 g/dL (ref 30.0–36.0)
MCV: 78 fL (ref 78.0–100.0)
Monocytes Absolute: 0.5 10*3/uL (ref 0.1–1.0)
Monocytes Relative: 7.2 % (ref 3.0–12.0)
Neutro Abs: 3.9 10*3/uL (ref 1.4–7.7)
Neutrophils Relative %: 60.8 % (ref 43.0–77.0)
Platelets: 297 10*3/uL (ref 150.0–400.0)
RBC: 5.22 Mil/uL — ABNORMAL HIGH (ref 3.87–5.11)
RDW: 18.1 % — ABNORMAL HIGH (ref 11.5–15.5)
WBC: 6.4 10*3/uL (ref 4.0–10.5)

## 2023-01-02 LAB — HEMOGLOBIN A1C: Hgb A1c MFr Bld: 5.9 % (ref 4.6–6.5)

## 2023-01-02 LAB — TSH: TSH: 1.59 u[IU]/mL (ref 0.35–5.50)

## 2023-01-02 MED ORDER — ESOMEPRAZOLE MAGNESIUM 40 MG PO CPDR: 40.0000 mg | DELAYED_RELEASE_CAPSULE | Freq: Every day | ORAL | 3 refills | Status: DC

## 2023-01-02 MED ORDER — POTASSIUM CHLORIDE CRYS ER 10 MEQ PO TBCR: 10.0000 meq | EXTENDED_RELEASE_TABLET | Freq: Every day | ORAL | 3 refills | Status: DC

## 2023-01-02 MED ORDER — METOPROLOL SUCCINATE ER 25 MG PO TB24: 25.0000 mg | ORAL_TABLET | Freq: Every day | ORAL | 3 refills | Status: DC

## 2023-01-02 NOTE — Progress Notes (Signed)
   Subjective:    Patient ID: Olivia Carr, female    DOB: 12/21/1960, 62 y.o.   MRN: [id number]  HPI Here for a well exam. She feels fine. She is working out at a gym 2 days a week.     Review of Systems  Constitutional: Negative.   HENT: Negative.    Eyes: Negative.   Respiratory: Negative.    Cardiovascular: Negative.   Gastrointestinal: Negative.   Genitourinary:  Negative for decreased urine volume, difficulty urinating, dyspareunia, dysuria, enuresis, flank pain, frequency, hematuria, pelvic pain and urgency.  Musculoskeletal: Negative.   Skin: Negative.   Neurological: Negative.  Negative for headaches.  Psychiatric/Behavioral: Negative.         Objective:   Physical Exam Constitutional:      General: She is not in acute distress.    Appearance: She is well-developed. She is obese.  HENT:     Head: Normocephalic and atraumatic.     Right Ear: External ear normal.     Left Ear: External ear normal.     Nose: Nose normal.     Mouth/Throat:     Pharynx: No oropharyngeal exudate.  Eyes:     General: No scleral icterus.    Conjunctiva/sclera: Conjunctivae normal.     Pupils: Pupils are equal, round, and reactive to light.  Neck:     Thyroid: No thyromegaly.     Vascular: No JVD.  Cardiovascular:     Rate and Rhythm: Normal rate and regular rhythm.     Pulses: Normal pulses.     Heart sounds: Normal heart sounds. No murmur heard.    No friction rub. No gallop.  Pulmonary:     Effort: Pulmonary effort is normal. No respiratory distress.     Breath sounds: Normal breath sounds. No wheezing or rales.  Chest:     Chest wall: No tenderness.  Abdominal:     General: Bowel sounds are normal. There is no distension.     Palpations: Abdomen is soft. There is no mass.     Tenderness: There is no abdominal tenderness. There is no guarding or rebound.  Musculoskeletal:        General: No tenderness. Normal range of motion.     Cervical back: Normal range  of motion and neck supple.  Lymphadenopathy:     Cervical: No cervical adenopathy.  Skin:    General: Skin is warm and dry.     Findings: No erythema or rash.  Neurological:     General: No focal deficit present.     Mental Status: She is alert and oriented to person, place, and time.     Cranial Nerves: No cranial nerve deficit.     Motor: No abnormal muscle tone.     Coordination: Coordination normal.     Deep Tendon Reflexes: Reflexes are normal and symmetric. Reflexes normal.  Psychiatric:        Mood and Affect: Mood normal.        Behavior: Behavior normal.        Thought Content: Thought content normal.        Judgment: Judgment normal.           Assessment & Plan:  Well exam. We discussed diet and exercise. Get fasting labs. Gershon Crane, MD

## 2023-01-13 ENCOUNTER — Other Ambulatory Visit: Payer: Self-pay | Admitting: Family Medicine

## 2023-01-27 ENCOUNTER — Telehealth: Payer: 59 | Admitting: Nurse Practitioner

## 2023-01-27 DIAGNOSIS — J0140 Acute pansinusitis, unspecified: Secondary | ICD-10-CM | POA: Diagnosis not present

## 2023-01-27 MED ORDER — DOXYCYCLINE HYCLATE 100 MG PO TABS: 100.0000 mg | ORAL_TABLET | Freq: Two times a day (BID) | ORAL | 0 refills | Status: AC

## 2023-01-27 NOTE — Progress Notes (Signed)
 Virtual Visit Consent   Olivia Carr, you are scheduled for a virtual visit with a [facility] provider today. Just as with appointments in the office, your consent must be obtained to participate. Your consent will be active for this visit and any virtual visit you may have with one of our providers in the next 365 days. If you have a MyChart account, a copy of this consent can be sent to you electronically.  As this is a virtual visit, video technology does not allow for your provider to perform a traditional examination. This may limit your provider's ability to fully assess your condition. If your provider identifies any concerns that need to be evaluated in person or the need to arrange testing (such as labs, EKG, etc.), we will make arrangements to do so. Although advances in technology are sophisticated, we cannot ensure that it will always work on either your end or our end. If the connection with a video visit is poor, the visit may have to be switched to a telephone visit. With either a video or telephone visit, we are not always able to ensure that we have a secure connection.  By engaging in this virtual visit, you consent to the provision of healthcare and authorize for your insurance to be billed (if applicable) for the services provided during this visit. Depending on your insurance coverage, you may receive a charge related to this service.  I need to obtain your verbal consent now. Are you willing to proceed with your visit today? Olivia Carr has provided verbal consent on 01/27/2023 for a virtual visit (video or telephone). Lauraine Kitty, FNP  Date: 01/27/2023 5:23 PM  Virtual Visit via Video Note   I, Lauraine Kitty, connected with  Olivia Carr  ([id number], 1960/09/04) on 01/27/23 at  5:30 PM EST by a video-enabled telemedicine application and verified that I am speaking with the correct person using two identifiers.  Location: Patient:  Virtual Visit Location Patient: Home Provider: Virtual Visit Location Provider: Home Office   I discussed the limitations of evaluation and management by telemedicine and the availability of in person appointments. The patient expressed understanding and agreed to proceed.    History of Present Illness: Olivia Carr is a 62 y.o. who identifies as a female who was assigned female at birth, and is being seen today for cough and congestion.   She started to feel sick 10 days ago. Thought initially it was allergies or a mild cold.  She has been using cough/congestion OTC   She has taken 2 home COVID tests that are negative   Now has plugged ear on the right, sinus pressure and tenderness into her maxillary regions  Is starting to have PND with a cough from drainage   Denies a fever  Is starting to have some fatigue as well   She stays on Flonase  and Zyrtec year round  She also gets allergy shots and did have her shot last week as well   She does have a rescue inhaler but has not needed to use it  Was on Singulair  prior and has added that back in over the past three nights as well    Problems:  Patient Active Problem List   Diagnosis Date Noted   COVID-19 virus infection 02/06/2020   Coccydynia 03/03/2014   Anxiety 01/19/2013   Perimenopause 01/19/2012   Acute upper respiratory infection 04/06/2010   Asthma with exacerbation 04/06/2010   BENIGN NEOPLASM OF PINEAL GLAND 09/24/2009  GASTROENTERITIS 07/13/2009   SORE THROAT 07/05/2009   FEVER UNSPECIFIED 07/05/2009   ASTHMA 01/22/2008   HYPERTENSION 10/12/2006   ALLERGIC RHINITIS 10/12/2006   GERD 10/08/2006   PNEUMONIA, HX OF 10/08/2006    Allergies:  Allergies  Allergen Reactions   Nitrofurantoin    Other     Compazine Uncontrolled eye & tongue movements   Sulfa Antibiotics    Medications:  Current Outpatient Medications:    albuterol  (VENTOLIN  HFA) 108 (90 Base) MCG/ACT inhaler, Inhale 1-2 puffs into  the lungs every 6 (six) hours as needed for wheezing or shortness of breath., Disp: , Rfl:    Calcium Carbonate-Vit D-Min (CALCIUM 1200 PO), Take 1 tablet by mouth every evening. , Disp: , Rfl:    cetirizine (ZYRTEC) 10 MG tablet, Take 10 mg by mouth daily., Disp: , Rfl:    EPINEPHrine  0.3 mg/0.3 mL IJ SOAJ injection, SMARTSIG:1 Auto-Injector Injection PRN, Disp: , Rfl:    esomeprazole  (NEXIUM ) 40 MG capsule, Take 1 capsule (40 mg total) by mouth daily., Disp: 90 capsule, Rfl: 3   fluticasone  (FLONASE ) 50 MCG/ACT nasal spray, USE 2 SPRAYS IN EACH NOSTRIL DAILY, Disp: 48 g, Rfl: 3   metoprolol  succinate (TOPROL -XL) 25 MG 24 hr tablet, Take 1 tablet (25 mg total) by mouth daily., Disp: 90 tablet, Rfl: 3   potassium chloride  (KLOR-CON  M) 10 MEQ tablet, Take 1 tablet (10 mEq total) by mouth daily., Disp: 90 tablet, Rfl: 3  Observations/Objective: Patient is well-developed, well-nourished in no acute distress.  Resting comfortably  at home.  Head is normocephalic, atraumatic.  No labored breathing.  Speech is clear and coherent with logical content.  Patient is alert and oriented at baseline.    Assessment and Plan:  1. Acute non-recurrent pansinusitis (Primary) Continue Flonase , Zyrtec use Albuterol  as needed May continue OTC cough/expectorant as directed    - doxycycline  (VIBRA -TABS) 100 MG tablet; Take 1 tablet (100 mg total) by mouth 2 (two) times daily for 7 days.  Dispense: 14 tablet; Refill: 0     Follow Up Instructions: I discussed the assessment and treatment plan with the patient. The patient was provided an opportunity to ask questions and all were answered. The patient agreed with the plan and demonstrated an understanding of the instructions.  A copy of instructions were sent to the patient via MyChart unless otherwise noted below.    The patient was advised to call back or seek an in-person evaluation if the symptoms worsen or if the condition fails to improve as  anticipated.    Lauraine Kitty, FNP

## 2023-01-28 HISTORY — PX: COLONOSCOPY: SHX5424

## 2023-02-26 ENCOUNTER — Telehealth: Payer: 59 | Admitting: Adult Health

## 2023-02-26 VITALS — Temp 98.2°F | Wt 210.0 lb

## 2023-02-26 DIAGNOSIS — U071 COVID-19: Secondary | ICD-10-CM | POA: Diagnosis not present

## 2023-02-26 MED ORDER — PREDNISONE 10 MG PO TABS: 10.0000 mg | ORAL_TABLET | Freq: Every day | ORAL | 0 refills | Status: DC

## 2023-02-26 MED ORDER — NIRMATRELVIR/RITONAVIR (PAXLOVID)TABLET: 3.0000 | ORAL_TABLET | Freq: Two times a day (BID) | ORAL | 0 refills | Status: AC

## 2023-02-26 NOTE — Progress Notes (Signed)
Virtual Visit via Video Note  I connected with Olivia Carr on 02/26/23 at  4:00 PM EST by a video enabled telemedicine application and verified that I am speaking with the correct person using two identifiers.  Location patient: home Location provider:work or home office Persons participating in the virtual visit: patient, provider  I discussed the limitations of evaluation and management by telemedicine and the availability of in person appointments. The patient expressed understanding and agreed to proceed.   HPI: 63 year old female who is being evaluated today for COVID-19.  She reports that her symptoms started yesterday with a mild cough and before she bent went to bed she noticed some wheezing.  She tested positive for COVID-19 earlier this morning.  She continues to have a productive cough with thick yellow sputum, wheezing, chills, and feeling rundown.  She has not had any fevers or chills.  At home she has been using Mucinex with some mild improvement as well as her albuterol inhaler which seems to help.   ROS: See pertinent positives and negatives per HPI.  Past Medical History:  Diagnosis Date   Allergic rhinitis    sees Dr. Hutton Callas    Asthma    sees Dr. Vidette Callas    Benign tumor of pineal gland [facility])     last MRI 10-10-09, stable, no follow up planned   DUB (dysfunctional uterine bleeding)    GERD (gastroesophageal reflux disease)    History of bone density study 03-22-13   normal    Hypertension    NSVD (normal spontaneous vaginal delivery)    X2   Pneumonia    Routine gynecological examination    sees Dr. Reynaldo Minium     Past Surgical History:  Procedure Laterality Date   ABDOMINAL HYSTERECTOMY     LAVH   ANTERIOR CRUCIATE LIGAMENT REPAIR     right knee with medial and lateral meniscectomies 09/04/08   COLONOSCOPY  05/06/2021   per Dr. Alanson Aly Deboraha Sprang), benign polyps, repeat in 5 yrs   DILATION AND CURETTAGE OF UTERUS     x 2   KNEE ARTHROSCOPY Left 2018    LIGAMENT REPAIR Left 08/24/2019   Procedure: LIGAMENT REPAIR;  Surgeon: Bjorn Pippin, MD;  Location: [facility];  Service: Orthopedics;  Laterality: Left;   MYOMECTOMY ABDOMINAL APPROACH     RADIAL HEAD ARTHROPLASTY Left 08/24/2019   Procedure: RADIAL HEAD ARTHROPLASTY;  Surgeon: Bjorn Pippin, MD;  Location: [facility];  Service: Orthopedics;  Laterality: Left;   TONSILLECTOMY     WISDOM TOOTH EXTRACTION      Family History  Problem Relation Age of Onset   Hypertension Mother    Cancer Father 30       PANCREATIC   Other Other        bowel disease   Diabetes Other    Hypertension Other    Coronary artery disease Other    Heart disease Maternal Grandmother    Cancer Maternal Grandfather        COLON   Heart disease Paternal Grandmother    Heart disease Paternal Grandfather    Immunocompromised Brother    Breast cancer Neg Hx        Current Outpatient Medications:    albuterol (VENTOLIN HFA) 108 (90 Base) MCG/ACT inhaler, Inhale 1-2 puffs into the lungs every 6 (six) hours as needed for wheezing or shortness of breath., Disp: , Rfl:    Calcium Carbonate-Vit D-Min (CALCIUM 1200 PO), Take 1 tablet by mouth  every evening. , Disp: , Rfl:    cetirizine (ZYRTEC) 10 MG tablet, Take 10 mg by mouth daily., Disp: , Rfl:    EPINEPHrine 0.3 mg/0.3 mL IJ SOAJ injection, SMARTSIG:1 Auto-Injector Injection PRN, Disp: , Rfl:    esomeprazole (NEXIUM) 40 MG capsule, Take 1 capsule (40 mg total) by mouth daily., Disp: 90 capsule, Rfl: 3   fluticasone (FLONASE) 50 MCG/ACT nasal spray, USE 2 SPRAYS IN EACH NOSTRIL DAILY, Disp: 48 g, Rfl: 3   metoprolol succinate (TOPROL-XL) 25 MG 24 hr tablet, Take 1 tablet (25 mg total) by mouth daily., Disp: 90 tablet, Rfl: 3   nirmatrelvir/ritonavir (PAXLOVID) 20 x 150 MG & 10 x 100MG  TABS, Take 3 tablets by mouth 2 (two) times daily for 5 days. (Take nirmatrelvir 150 mg two tablets twice daily for 5 days and ritonavir 100 mg one  tablet twice daily for 5 days) Patient GFR is >60, Disp: 30 tablet, Rfl: 0   potassium chloride (KLOR-CON M) 10 MEQ tablet, Take 1 tablet (10 mEq total) by mouth daily., Disp: 90 tablet, Rfl: 3   predniSONE (DELTASONE) 10 MG tablet, Take 1 tablet (10 mg total) by mouth daily with breakfast., Disp: 5 tablet, Rfl: 0  EXAM:  VITALS per patient if applicable:  GENERAL: alert, oriented, appears well and in no acute distress  HEENT: atraumatic, conjunttiva clear, no obvious abnormalities on inspection of external nose and ears  NECK: normal movements of the head and neck  LUNGS: on inspection no signs of respiratory distress, breathing rate appears normal, no obvious gross SOB, gasping or wheezing  CV: no obvious cyanosis  MS: moves all visible extremities without noticeable abnormality  PSYCH/NEURO: pleasant and cooperative, no obvious depression or anxiety, speech and thought processing grossly intact  ASSESSMENT AND PLAN:  Discussed the following assessment and plan:  1. COVID-19 virus infection (Primary) -With her history of asthma we will treat with Paxlovid and prednisone.  Continue to use albuterol inhaler and Mucinex at home.  Stay hydrated and rest.  We reviewed quarantine instructions.  Follow-up if symptoms not improving in the next 24 to 36 hours - nirmatrelvir/ritonavir (PAXLOVID) 20 x 150 MG & 10 x 100MG  TABS; Take 3 tablets by mouth 2 (two) times daily for 5 days. (Take nirmatrelvir 150 mg two tablets twice daily for 5 days and ritonavir 100 mg one tablet twice daily for 5 days) Patient GFR is >60  Dispense: 30 tablet; Refill: 0 - predniSONE (DELTASONE) 10 MG tablet; Take 1 tablet (10 mg total) by mouth daily with breakfast.  Dispense: 5 tablet; Refill: 0       I discussed the assessment and treatment plan with the patient. The patient was provided an opportunity to ask questions and all were answered. The patient agreed with the plan and demonstrated an understanding of  the instructions.   The patient was advised to call back or seek an in-person evaluation if the symptoms worsen or if the condition fails to improve as anticipated.   Shirline Frees, NP

## 2023-02-28 ENCOUNTER — Encounter (HOSPITAL_BASED_OUTPATIENT_CLINIC_OR_DEPARTMENT_OTHER): Payer: Self-pay

## 2023-02-28 ENCOUNTER — Other Ambulatory Visit: Payer: Self-pay

## 2023-02-28 ENCOUNTER — Emergency Department (HOSPITAL_BASED_OUTPATIENT_CLINIC_OR_DEPARTMENT_OTHER): Payer: 59 | Admitting: Radiology

## 2023-02-28 ENCOUNTER — Emergency Department (HOSPITAL_BASED_OUTPATIENT_CLINIC_OR_DEPARTMENT_OTHER)
Admission: EM | Admit: 2023-02-28 | Discharge: 2023-02-28 | Disposition: A | Discharge status: Home / Self Care | Payer: 59 | Attending: Emergency Medicine | Admitting: Emergency Medicine

## 2023-02-28 DIAGNOSIS — J45909 Unspecified asthma, uncomplicated: Secondary | ICD-10-CM | POA: Insufficient documentation

## 2023-02-28 DIAGNOSIS — U071 COVID-19: Secondary | ICD-10-CM | POA: Insufficient documentation

## 2023-02-28 DIAGNOSIS — I10 Essential (primary) hypertension: Secondary | ICD-10-CM | POA: Insufficient documentation

## 2023-02-28 DIAGNOSIS — R059 Cough, unspecified: Secondary | ICD-10-CM | POA: Diagnosis present

## 2023-02-28 MED ORDER — ALBUTEROL SULFATE HFA 108 (90 BASE) MCG/ACT IN AERS: 2.0000 | INHALATION_SPRAY | RESPIRATORY_TRACT | Status: DC | PRN

## 2023-02-28 MED ORDER — BENZONATATE 100 MG PO CAPS: 100.0000 mg | ORAL_CAPSULE | Freq: Three times a day (TID) | ORAL | 0 refills | Status: DC

## 2023-02-28 NOTE — ED Triage Notes (Signed)
Patient arrives with complaints of worsening cough x3 days. Patient states that she is currently covid Positive and taking paxlovid.  ---she has been using albuterol as well for the cough with minimal relief.

## 2023-02-28 NOTE — Discharge Instructions (Signed)
You have been evaluated for your symptoms.  Your chest x-ray did not show any pneumonia.  Your symptoms likely due to COVID infection.  Continue with medication previously prescribed and you may take Tessalon as needed for cough.  Stay hydrated, use your albuterol inhaler 2 puffs every 4 hours as needed for shortness of breath.  Return if you have any concern.

## 2023-02-28 NOTE — ED Provider Notes (Signed)
[place] EMERGENCY DEPARTMENT AT [facility] Provider Note   CSN: [id number] Arrival date & time: 02/28/23  1729     History  Chief Complaint  Patient presents with   Cough   Covid Positive    Olivia Carr is a 63 y.o. female.  The history is provided by the patient and medical records. No language interpreter was used.  Cough    63 year old female history of asthma, GERD, hypertension presenting with cold symptoms.  Patient report for the past 3 days she has had persistent coughing.  4 days ago patient developed fever, chills, body aches, congestion, cough, and some chest tightness.  She had a home positive COVID test, she also had a virtual visit the next day and was prescribed Paxlovid as well as prednisone.  She also has inhaler at home that she has been using but today she felt her coughing and wheezing increases thus prompting this ER visit.  She does not endorse any significant nausea vomiting diarrhea and her shortness of breath is uncontrolled.  No hemoptysis no leg swelling or calf tenderness.  Home Medications Prior to Admission medications   Medication Sig Start Date End Date Taking? Authorizing Provider  albuterol (VENTOLIN HFA) 108 (90 Base) MCG/ACT inhaler Inhale 1-2 puffs into the lungs every 6 (six) hours as needed for wheezing or shortness of breath. 05/26/19   [provider]  Calcium Carbonate-Vit D-Min (CALCIUM 1200 PO) Take 1 tablet by mouth every evening.     [provider]  cetirizine (ZYRTEC) 10 MG tablet Take 10 mg by mouth daily.    [provider]  EPINEPHrine 0.3 mg/0.3 mL IJ SOAJ injection SMARTSIG:1 Auto-Injector Injection PRN    [provider]  esomeprazole (NEXIUM) 40 MG capsule Take 1 capsule (40 mg total) by mouth daily. 01/02/23   Nelwyn Salisbury, MD  fluticasone [facility]) 50 MCG/ACT nasal spray USE 2 SPRAYS IN [facility] NOSTRIL DAILY 08/11/22   Nelwyn Salisbury, MD  metoprolol succinate  (TOPROL-XL) 25 MG 24 hr tablet Take 1 tablet (25 mg total) by mouth daily. 01/02/23   Nelwyn Salisbury, MD  nirmatrelvir/ritonavir (PAXLOVID) 20 x 150 MG & 10 x 100MG  TABS Take 3 tablets by mouth 2 (two) times daily for 5 days. (Take nirmatrelvir 150 mg two tablets twice daily for 5 days and ritonavir 100 mg one tablet twice daily for 5 days) Patient GFR is >60 02/26/23 03/03/23  Nafziger, Kandee Keen, NP  potassium chloride (KLOR-CON M) 10 MEQ tablet Take 1 tablet (10 mEq total) by mouth daily. 01/02/23   Nelwyn Salisbury, MD  predniSONE (DELTASONE) 10 MG tablet Take 1 tablet (10 mg total) by mouth daily with breakfast. 02/26/23   Shirline Frees, NP      Allergies    Nitrofurantoin, Other, and Sulfa antibiotics    Review of Systems   Review of Systems  Respiratory:  Positive for cough.   All other systems reviewed and are negative.   Physical Exam Updated Vital Signs BP (!) 154/84 (BP Location: Right Arm)   Pulse 72   Temp 98.6 F (37 C) (Oral)   Resp 20   Ht 5\' 5"  (1.651 m)   Wt 95.3 kg   SpO2 95%   BMI 34.95 kg/m  Physical Exam Vitals and nursing note reviewed.  Constitutional:      General: She is not in acute distress.    Appearance: She is well-developed.  HENT:     Head: Atraumatic.  Eyes:  Conjunctiva/sclera: Conjunctivae normal.  Cardiovascular:     Rate and Rhythm: Normal rate and regular rhythm.     Pulses: Normal pulses.     Heart sounds: Normal heart sounds.  Pulmonary:     Effort: Pulmonary effort is normal.     Breath sounds: Rhonchi present. No wheezing or rales.  Abdominal:     Palpations: Abdomen is soft.  Musculoskeletal:     Cervical back: Neck supple.     Right lower leg: No edema.     Left lower leg: No edema.  Skin:    Findings: No rash.  Neurological:     Mental Status: She is alert. Mental status is at baseline.  Psychiatric:        Mood and Affect: Mood normal.     ED Results / Procedures / Treatments   Labs (all labs ordered are listed, but  only abnormal results are displayed) Labs Reviewed - No data to display  EKG None  Radiology DG Chest 2 View Result Date: 02/28/2023 CLINICAL DATA:  Shortness of breath, cough EXAM: CHEST - 2 VIEW COMPARISON:  02/14/2012 FINDINGS: Coarse perihilar bronchovascular markings more conspicuous than on previous. No confluent airspace disease or overt edema. Heart size and mediastinal contours are within normal limits. No effusion. Visualized bones unremarkable. IMPRESSION: Increased perihilar bronchovascular markings suggesting viral process or reactive airways. Electronically Signed   By: Corlis Leak M.D.   On: 02/28/2023 18:16    Procedures Procedures    Medications Ordered in ED Medications  albuterol (VENTOLIN HFA) 108 (90 Base) MCG/ACT inhaler 2 puff (has no administration in time range)    ED Course/ Medical Decision Making/ A&P                                 Medical Decision Making Amount and/or Complexity of Data Reviewed Radiology: ordered.  Risk Prescription drug management.   BP (!) 154/84 (BP Location: Right Arm)   Pulse 72   Temp 98.6 F (37 C) (Oral)   Resp 20   Ht 5\' 5"  (1.651 m)   Wt 95.3 kg   SpO2 95%   BMI 34.95 kg/m   56:44 PM  63 year old female history of asthma, GERD, hypertension presenting with cold symptoms.  Patient report for the past 3 days she has had persistent coughing.  4 days ago patient developed fever, chills, body aches, congestion, cough, and some chest tightness.  She had a home positive COVID test, she also had a virtual visit the next day and was prescribed Paxlovid as well as prednisone.  She also has inhaler at home that she has been using but today she felt her coughing and wheezing increases thus prompting this ER visit.  She does not endorse any significant nausea vomiting diarrhea and her shortness of breath is uncontrolled.  No hemoptysis no leg swelling or calf tenderness.  On exam, patient is resting comfortably coughing on  occasion but otherwise well-appearing.  Lungs otherwise clear without any significant wheezes.  Some mild rhonchi heard.  No peripheral edema.  Vital signs are reassuring no hypoxia.  Chest x-ray obtained independently viewed and interpreted by me showing bronchovascular markings suggestive of viral process or reactive airway.  This is consistent with patient's recent positive COVID test.  Will prescribe cough medication, and gave patient return precaution.  Hospital admission considered but without any hypoxia I felt patient is stable to go home.  Will discharge home  with Tessalon for cough.  Patient will continue with her Paxlovid and prednisone previously prescribed.  She was given albuterol inhaler with improvement of symptoms.  Appropriate care instruction provided.  DDx: COVID, flu, RSV, pneumonia, viral illness        Final Clinical Impression(s) / ED Diagnoses Final diagnoses:  COVID-19 virus infection    Rx / DC Orders ED Discharge Orders          Ordered    benzonatate (TESSALON) 100 MG capsule  Every 8 hours        02/28/23 1854              Fayrene Helper, PA-C 02/28/23 1856    Anders Simmonds T, DO 03/03/23 0104

## 2023-03-03 ENCOUNTER — Encounter: Payer: Self-pay | Admitting: Adult Health

## 2023-03-03 ENCOUNTER — Ambulatory Visit: Payer: 59 | Admitting: Adult Health

## 2023-03-03 VITALS — BP 120/80 | HR 95 | Temp 98.5°F | Ht 65.0 in | Wt 212.0 lb

## 2023-03-03 DIAGNOSIS — J988 Other specified respiratory disorders: Secondary | ICD-10-CM

## 2023-03-03 MED ORDER — PREDNISONE 20 MG PO TABS: 20.0000 mg | ORAL_TABLET | Freq: Every day | ORAL | 0 refills | Status: AC

## 2023-03-03 MED ORDER — AZITHROMYCIN 250 MG PO TABS: ORAL_TABLET | ORAL | 0 refills | Status: AC

## 2023-03-03 MED ORDER — IPRATROPIUM-ALBUTEROL 0.5-2.5 (3) MG/3ML IN SOLN
3.0000 mL | Freq: Once | RESPIRATORY_TRACT | Status: AC
  Administered 2023-03-03: 3 mL via RESPIRATORY_TRACT

## 2023-03-03 NOTE — Progress Notes (Signed)
 Subjective:    Patient ID: Olivia Carr, female    DOB: 06-26-1960, 63 y.o.    MRN: [id number]  HPI 63 year old female who  has a past medical history of Allergic rhinitis, Asthma, Benign tumor of pineal gland (HCC), DUB (dysfunctional uterine bleeding), GERD (gastroesophageal reflux disease), History of bone density study (03-22-13), Hypertension, NSVD (normal spontaneous vaginal delivery), Pneumonia, and Routine gynecological examination.  She is a patient of Dr. Johnny who I am seeing today for follow up after being seen in the ER  The patient and I had a virtual visit roughly 5 days ago after she tested positive for COVID-19 the day prior.  She had a productive cough with thick yellow sputum, wheezing, chills, and feeling rundown.  She was started on prednisone  and Paxlovid   Two days later she felt as though her coughing and wheezing had increased despite using an albuterol  inhaler that she had at home taking Paxlovid  and prednisone  that was prescribed.  Prompted her to go to the emergency room  In the ER her exam revealed some mild rhonchi but overall clear without any significant wheezing.  She did not have any peripheral edema.  He had a chest ray x-ray that showed bronchovascular markings suggestive of viral process or reactive airway.  She was discharged home with Tessalon  for her cough and was advised to continue with Paxlovid  and prednisone .  Today she reports that the day after she was seen in the ER that she was feeling better. Yesterday her symptoms came back and is having a a productive cough, fevers, and has started to developed sinus pressure and nasal congestion. She has one more day of Paxlovid . She finished her steroids.    Review of Systems See HPI   Past Medical History:  Diagnosis Date   Allergic rhinitis    sees Dr. Frutoso    Asthma    sees Dr. Frutoso    Benign tumor of pineal gland [facility])     last MRI 10-10-09, stable, no follow up planned   DUB  (dysfunctional uterine bleeding)    GERD (gastroesophageal reflux disease)    History of bone density study 03-22-13   normal    Hypertension    NSVD (normal spontaneous vaginal delivery)    X2   Pneumonia    Routine gynecological examination    sees Dr. Curlee Guan     Social History   Socioeconomic History   Marital status: Married    Spouse name: Not on file   Number of children: Not on file   Years of education: Not on file   Highest education level: Bachelor's degree (e.g., BA, AB, BS)  Occupational History   Not on file  Tobacco Use   Smoking status: Never   Smokeless tobacco: Never  Vaping Use   Vaping status: Never Used  Substance and Sexual Activity   Alcohol use: Yes    Alcohol/week: 7.0 standard drinks of alcohol    Types: 7 Standard drinks or equivalent per week    Comment: glass of wine each day   Drug use: No   Sexual activity: Yes    Partners: Male    Birth control/protection: Surgical    Comment: HYST. 1st intercourse- 22, partners- 1  Other Topics Concern   Not on file  Social History Narrative   Not on file   Social Drivers of Health   Financial Resource Strain: Low Risk  (02/26/2023)   Overall Financial Resource Strain (CARDIA)  Difficulty of Paying Living Expenses: Not hard at all  Food Insecurity: No Food Insecurity (02/26/2023)   Hunger Vital Sign    Worried About Running Out of Food in the Last Year: Never true    Ran Out of Food in the Last Year: Never true  Transportation Needs: No Transportation Needs (02/26/2023)   PRAPARE - Administrator, Civil Service (Medical): No    Lack of Transportation (Non-Medical): No  Physical Activity: Sufficiently Active (02/26/2023)   Exercise Vital Sign    Days of Exercise per Week: 3 days    Minutes of Exercise per Session: 60 min  Stress: No Stress Concern Present (02/26/2023)   [employer] of Occupational Health - Occupational Stress Questionnaire    Feeling of Stress : Only a  little  Social Connections: Socially Integrated (02/26/2023)   Social Connection and Isolation Panel [NHANES]    Frequency of Communication with Friends and Family: More than three times a week    Frequency of Social Gatherings with Friends and Family: Twice a week    Attends Religious Services: More than 4 times per year    Active Member of Clubs or Organizations: Yes    Attends Engineer, Structural: More than 4 times per year    Marital Status: Living with partner  Intimate Partner Violence: Not on file    Past Surgical History:  Procedure Laterality Date   ABDOMINAL HYSTERECTOMY     LAVH   ANTERIOR CRUCIATE LIGAMENT REPAIR     right knee with medial and lateral meniscectomies 09/04/08   COLONOSCOPY  05/06/2021   per Dr. Julianne Gwen), benign polyps, repeat in 5 yrs   DILATION AND CURETTAGE OF UTERUS     x 2   KNEE ARTHROSCOPY Left 2018   LIGAMENT REPAIR Left 08/24/2019   Procedure: LIGAMENT REPAIR;  Surgeon: Cristy Bonner DASEN, MD;  Location: [facility];  Service: Orthopedics;  Laterality: Left;   MYOMECTOMY ABDOMINAL APPROACH     RADIAL HEAD ARTHROPLASTY Left 08/24/2019   Procedure: RADIAL HEAD ARTHROPLASTY;  Surgeon: Cristy Bonner DASEN, MD;  Location: [facility];  Service: Orthopedics;  Laterality: Left;   TONSILLECTOMY     WISDOM TOOTH EXTRACTION      Family History  Problem Relation Age of Onset   Hypertension Mother    Cancer Father 87       PANCREATIC   Other Other        bowel disease   Diabetes Other    Hypertension Other    Coronary artery disease Other    Heart disease Maternal Grandmother    Cancer Maternal Grandfather        COLON   Heart disease Paternal Grandmother    Heart disease Paternal Grandfather    Immunocompromised Brother    Breast cancer Neg Hx     Allergies  Allergen Reactions   Nitrofurantoin    Other     Compazine Uncontrolled eye & tongue movements   Sulfa Antibiotics     Current Outpatient  Medications on File Prior to Visit  Medication Sig Dispense Refill   albuterol  (VENTOLIN  HFA) 108 (90 Base) MCG/ACT inhaler Inhale 1-2 puffs into the lungs every 6 (six) hours as needed for wheezing or shortness of breath.     benzonatate  (TESSALON ) 100 MG capsule Take 1 capsule (100 mg total) by mouth every 8 (eight) hours. 21 capsule 0   Calcium Carbonate-Vit D-Min (CALCIUM 1200 PO) Take 1 tablet by mouth every  evening.      cetirizine (ZYRTEC) 10 MG tablet Take 10 mg by mouth daily.     EPINEPHrine  0.3 mg/0.3 mL IJ SOAJ injection SMARTSIG:1 Auto-Injector Injection PRN     esomeprazole  (NEXIUM ) 40 MG capsule Take 1 capsule (40 mg total) by mouth daily. 90 capsule 3   fluticasone  (FLONASE ) 50 MCG/ACT nasal spray USE 2 SPRAYS IN EACH NOSTRIL DAILY 48 g 3   metoprolol  succinate (TOPROL -XL) 25 MG 24 hr tablet Take 1 tablet (25 mg total) by mouth daily. 90 tablet 3   nirmatrelvir /ritonavir  (PAXLOVID ) 20 x 150 MG & 10 x 100MG  TABS Take 3 tablets by mouth 2 (two) times daily for 5 days. (Take nirmatrelvir  150 mg two tablets twice daily for 5 days and ritonavir  100 mg one tablet twice daily for 5 days) Patient GFR is >60 30 tablet 0   potassium chloride  (KLOR-CON  M) 10 MEQ tablet Take 1 tablet (10 mEq total) by mouth daily. 90 tablet 3   predniSONE  (DELTASONE ) 10 MG tablet Take 1 tablet (10 mg total) by mouth daily with breakfast. 5 tablet 0   No current facility-administered medications on file prior to visit.    BP 120/80   Pulse 95   Temp 98.5 F (36.9 C) (Oral)   Ht 5' 5 (1.651 m)   Wt 212 lb (96.2 kg)   SpO2 95%   BMI 35.28 kg/m       Objective:   Physical Exam Vitals and nursing note reviewed.  Constitutional:      Appearance: Normal appearance.  HENT:     Nose: Congestion and rhinorrhea present. Rhinorrhea is purulent.     Right Turbinates: Enlarged and swollen.     Left Turbinates: Enlarged and swollen.  Cardiovascular:     Rate and Rhythm: Normal rate and regular rhythm.      Pulses: Normal pulses.     Heart sounds: Normal heart sounds.  Pulmonary:     Effort: Pulmonary effort is normal.     Breath sounds: No stridor. Wheezing present. No rhonchi or rales.  Musculoskeletal:        General: Normal range of motion.  Skin:    General: Skin is warm and dry.  Neurological:     General: No focal deficit present.     Mental Status: She is alert and oriented to person, place, and time.  Psychiatric:        Mood and Affect: Mood normal.        Behavior: Behavior normal.        Thought Content: Thought content normal.        Judgment: Judgment normal.       Assessment & Plan:  1. Respiratory infection (Primary) - Will place on 5 days of higher dose steroids and start on Azithromycin  for suspected secondary sinusitis.  - azithromycin  (ZITHROMAX ) 250 MG tablet; Take 2 tablets on day 1, then 1 tablet daily on days 2 through 5  Dispense: 6 tablet; Refill: 0 - predniSONE  (DELTASONE ) 20 MG tablet; Take 1 tablet (20 mg total) by mouth daily with breakfast for 5 days.  Dispense: 5 tablet; Refill: 0 - ipratropium-albuterol  (DUONEB) 0.5-2.5 (3) MG/3ML nebulizer solution 3 mL - Continue to use Albuterol  as needed  - Follow up if not improving in the next 2-3 days  After duo neb she reported feeling better and could breath easier. On exam she was moving air throughout lungs much easier and wheezing had resolved   Bevin Mayall, NP

## 2023-03-05 ENCOUNTER — Encounter: Payer: Self-pay | Admitting: Adult Health

## 2023-03-05 NOTE — Telephone Encounter (Signed)
**Note De-identified  [place] Obfuscation** Please advise 

## 2023-03-05 NOTE — Telephone Encounter (Signed)
 Yes she can cut the Prednisone  in half to take 1/2 tablet BID. Also yes she can stop Benzonatate  and go back to [facility]

## 2023-04-16 ENCOUNTER — Other Ambulatory Visit: Payer: Self-pay | Admitting: Family Medicine

## 2023-04-16 DIAGNOSIS — G519 Disorder of facial nerve, unspecified: Secondary | ICD-10-CM

## 2023-04-21 ENCOUNTER — Ambulatory Visit: Admitting: Family Medicine

## 2023-04-21 ENCOUNTER — Encounter: Payer: Self-pay | Admitting: Family Medicine

## 2023-04-21 VITALS — BP 128/76 | HR 65 | Temp 97.7°F | Ht 65.0 in | Wt 214.1 lb

## 2023-04-21 DIAGNOSIS — H6593 Unspecified nonsuppurative otitis media, bilateral: Secondary | ICD-10-CM | POA: Diagnosis not present

## 2023-04-21 NOTE — Progress Notes (Signed)
 Established Patient Office Visit  Subjective   Patient ID: Olivia Carr, female    DOB: March 24, 1960  Age: 63 y.o. MRN: [id number]  Chief Complaint  Patient presents with   Follow-up    Urgent care follow-up (9 days ago) for sinus and ear pressure, loss of hearing left ear, mucus is yellow an green, patient was given medrol, Augmentin, dexamethasone     HPI   Olivia Carr is here with bilateral ear fullness and decreased hearing right ear greater than left.  She states she developed some sinusitis type symptoms went to urgent care 10 days ago and prescribed Medrol Dosepak along with Augmentin and apparently given dexamethasone.  She initially had some yellow-green nasal mucus and feels like those symptoms have improved somewhat.  Denies any fever.  No tinnitus.  No vertigo.  Has tried Sudafed and nasal irrigation without improvement.  Has also tried nasal steroids without improvement.  Past Medical History:  Diagnosis Date   Allergic rhinitis    sees Dr. Alberta Callas    Asthma    sees Dr. [place] Callas    Benign tumor of pineal gland [facility])     last MRI 10-10-09, stable, no follow up planned   DUB (dysfunctional uterine bleeding)    GERD (gastroesophageal reflux disease)    History of bone density study 03-22-13   normal    Hypertension    NSVD (normal spontaneous vaginal delivery)    X2   Pneumonia    Routine gynecological examination    sees Dr. Reynaldo Carr    Past Surgical History:  Procedure Laterality Date   ABDOMINAL HYSTERECTOMY     LAVH   ANTERIOR CRUCIATE LIGAMENT REPAIR     right knee with medial and lateral meniscectomies 09/04/08   COLONOSCOPY  05/06/2021   per Dr. Alanson Aly Deboraha Sprang), benign polyps, repeat in 5 yrs   DILATION AND CURETTAGE OF UTERUS     x 2   KNEE ARTHROSCOPY Left 2018   LIGAMENT REPAIR Left 08/24/2019   Procedure: LIGAMENT REPAIR;  Surgeon: Bjorn Pippin, MD;  Location: [facility];  Service: Orthopedics;  Laterality: Left;    MYOMECTOMY ABDOMINAL APPROACH     RADIAL HEAD ARTHROPLASTY Left 08/24/2019   Procedure: RADIAL HEAD ARTHROPLASTY;  Surgeon: Bjorn Pippin, MD;  Location: [facility];  Service: Orthopedics;  Laterality: Left;   TONSILLECTOMY     WISDOM TOOTH EXTRACTION      reports that she has never smoked. She has never used smokeless tobacco. She reports current alcohol use of about 7.0 standard drinks of alcohol per week. She reports that she does not use drugs. family history includes Cancer in her maternal grandfather; Cancer (age of onset: 43) in her father; Coronary artery disease in an other family member; Diabetes in an other family member; Heart disease in her maternal grandmother, paternal grandfather, and paternal grandmother; Hypertension in her mother and another family member; Immunocompromised in her brother; Other in an other family member. Allergies  Allergen Reactions   Nitrofurantoin    Other     Compazine Uncontrolled eye & tongue movements   Sulfa Antibiotics     Review of Systems  Constitutional:  Negative for chills and fever.  HENT:  Positive for hearing loss. Negative for congestion, ear discharge, ear pain, sinus pain and tinnitus.       Objective:     BP 128/76 (BP Location: Left Arm, Patient Position: Sitting, Cuff Size: Normal)   Pulse 65   Temp  97.7 F (36.5 C) (Oral)   Ht 5\' 5"  (1.651 m)   Wt 214 lb 1.6 oz (97.1 kg)   SpO2 96%   BMI 35.63 kg/m  BP Readings from Last 3 Encounters:  04/21/23 128/76  03/03/23 120/80  02/28/23 (!) 141/90   Wt Readings from Last 3 Encounters:  04/21/23 214 lb 1.6 oz (97.1 kg)  03/03/23 212 lb (96.2 kg)  02/28/23 210 lb (95.3 kg)      Physical Exam Vitals reviewed.  Constitutional:      General: She is not in acute distress.    Appearance: She is not ill-appearing.  HENT:     Ears:     Comments: She has evidence for serous effusion bilaterally.  Left eardrum is slightly bulging.  No erythema of either  eardrum.  No suppurative changes.  No perforation. Cardiovascular:     Rate and Rhythm: Normal rate and regular rhythm.  Neurological:     Mental Status: She is alert.      No results found for any visits on 04/21/23.    The 10-year ASCVD risk score (Arnett DK, et al., 2019) is: 4.5%    Assessment & Plan:   Bilateral otitis media with effusion.  No evidence or suppurative changes.  This follows recent sinusitis type symptoms.  No indication for further antibiotics at this time.  We explained these usually resolve with time.  Consider ENT referral if not resolving over several weeks.  Follow-up for any pain, fever, or other concerns   Olivia Peat, MD

## 2023-04-23 ENCOUNTER — Telehealth: Payer: Self-pay | Admitting: Family Medicine

## 2023-04-23 DIAGNOSIS — H6503 Acute serous otitis media, bilateral: Secondary | ICD-10-CM

## 2023-04-23 NOTE — Telephone Encounter (Signed)
 Copied from CRM [id number]. Topic: Referral - Request for Referral >> Apr 23, 2023  2:56 PM Elizebeth Brooking wrote: Did the patient discuss referral with their provider in the last year? Yes (If No - schedule appointment) (If Yes - send message)  Appointment offered? Yes  Type of order/referral and detailed reason for visit: ENT - Impacting Hearing   Preference of office, provider, location: ENT, No Preference   If referral order, have you been seen by this specialty before? No (If Yes, this issue or another issue? When? Where?  Can we respond through MyChart? Yes

## 2023-04-25 ENCOUNTER — Telehealth: Admitting: Physician Assistant

## 2023-04-25 DIAGNOSIS — R3989 Other symptoms and signs involving the genitourinary system: Secondary | ICD-10-CM

## 2023-04-25 MED ORDER — CEPHALEXIN 500 MG PO CAPS: 500.0000 mg | ORAL_CAPSULE | Freq: Two times a day (BID) | ORAL | 0 refills | Status: DC

## 2023-04-25 NOTE — Patient Instructions (Signed)
 Olivia Carr, thank you for joining Margaretann Loveless, PA-C for today's virtual visit.  While this provider is not your primary care provider (PCP), if your PCP is located in our provider database this encounter information will be shared with them immediately following your visit.   A [facility] MyChart account gives you access to today's visit and all your visits, tests, and labs performed at [facility] " click here if you don't have a [facility] MyChart account or go to [email or web address]  Consent: (Patient) Olivia Carr provided verbal consent for this virtual visit at the beginning of the encounter.  Current Medications:  Current Outpatient Medications:    cephALEXin (KEFLEX) 500 MG capsule, Take 1 capsule (500 mg total) by mouth 2 (two) times daily., Disp: 14 capsule, Rfl: 0   albuterol (VENTOLIN HFA) 108 (90 Base) MCG/ACT inhaler, Inhale 1-2 puffs into the lungs every 6 (six) hours as needed for wheezing or shortness of breath., Disp: , Rfl:    amoxicillin-clavulanate (AUGMENTIN) 875-125 MG tablet, SMARTSIG:1 Tablet(s) By Mouth Every 12 Hours, Disp: , Rfl:    benzonatate (TESSALON) 100 MG capsule, Take 1 capsule (100 mg total) by mouth every 8 (eight) hours., Disp: 21 capsule, Rfl: 0   Calcium Carbonate-Vit D-Min (CALCIUM 1200 PO), Take 1 tablet by mouth every evening. , Disp: , Rfl:    cetirizine (ZYRTEC) 10 MG tablet, Take 10 mg by mouth daily., Disp: , Rfl:    EPINEPHrine 0.3 mg/0.3 mL IJ SOAJ injection, SMARTSIG:1 Auto-Injector Injection PRN, Disp: , Rfl:    esomeprazole (NEXIUM) 40 MG capsule, Take 1 capsule (40 mg total) by mouth daily., Disp: 90 capsule, Rfl: 3   fluticasone (FLONASE) 50 MCG/ACT nasal spray, USE 2 SPRAYS IN EACH NOSTRIL DAILY, Disp: 48 g, Rfl: 3   methylPREDNISolone (MEDROL DOSEPAK) 4 MG TBPK tablet, Take by mouth as directed., Disp: , Rfl:    metoprolol succinate (TOPROL-XL) 25 MG 24 hr tablet, Take 1 tablet (25  mg total) by mouth daily., Disp: 90 tablet, Rfl: 3   potassium chloride (KLOR-CON M) 10 MEQ tablet, Take 1 tablet (10 mEq total) by mouth daily., Disp: 90 tablet, Rfl: 3   Medications ordered in this encounter:  Meds ordered this encounter  Medications   cephALEXin (KEFLEX) 500 MG capsule    Sig: Take 1 capsule (500 mg total) by mouth 2 (two) times daily.    Dispense:  14 capsule    Refill:  0    Supervising Provider:   Merrilee Jansky [[id number]]     *If you need refills on other medications prior to your next appointment, please contact your pharmacy*  Follow-Up: Call back or seek an in-person evaluation if the symptoms worsen or if the condition fails to improve as anticipated.  [facility] [phone]  Other Instructions Urinary Tract Infection, Female A urinary tract infection (UTI) is an infection in your urinary tract. The urinary tract is made up of organs that make, store, and get rid of pee (urine) in your body. These organs include: The kidneys. The ureters. The bladder. The urethra. What are the causes? Most UTIs are caused by germs called bacteria. They may be in or near your genitals. These germs grow and cause swelling in your urinary tract. What increases the risk? You're more likely to get a UTI if: You're a female. The urethra is shorter in females than in males. You have a soft tube called a catheter that drains your pee. You  can't control when you pee or poop. You have trouble peeing because of: A kidney stone. A urinary blockage. A nerve condition that affects your bladder. Not getting enough to drink. You're sexually active. You use a birth control inside your vagina, like spermicide. You're pregnant. You have low levels of the hormone estrogen in your body. You're an older adult. You're also more likely to get a UTI if you have other health problems. These may include: Diabetes. A weak immune system. Your immune system is your  body's defense system. Sickle cell disease. Injury of the spine. What are the signs or symptoms? Symptoms may include: Needing to pee right away. Peeing small amounts often. Pain or burning when you pee. Blood in your pee. Pee that smells bad or odd. Pain in your belly or lower back. You may also: Feel confused. This may be the first symptom in older adults. Vomit. Not feel hungry. Feel tired or easily annoyed. Have a fever or chills. How is this diagnosed? A UTI is diagnosed based on your medical history and an exam. You may also have other tests. These may include: Pee tests. Blood tests. Tests for sexually transmitted infections (STIs). If you've had more than one UTI, you may need to have imaging studies done to find out why you keep getting them. How is this treated? A UTI can be treated by: Taking antibiotics or other medicines. Drinking enough fluid to keep your pee pale yellow. In rare cases, a UTI can cause a very bad condition called sepsis. Sepsis may be treated in the hospital. Follow these instructions at home: Medicines Take your medicines only as told by your health care provider. If you were given antibiotics, take them as told by your provider. Do not stop taking them even if you start to feel better. General instructions Make sure you: Pee often and fully. Do not hold your pee for a long time. Wipe from front to back after you pee or poop. Use each tissue only once when you wipe. Pee after you have sex. Do not douche or use sprays or powders in your genital area. Contact a health care provider if: Your symptoms don't get better after 1-2 days of taking antibiotics. Your symptoms go away and then come back. You have a fever or chills. You vomit or feel like you may vomit. Get help right away if: You have very bad pain in your back or lower belly. You faint. This information is not intended to replace advice given to you by your health care provider. Make  sure you discuss any questions you have with your health care provider. Document Revised: 08/21/2022 Document Reviewed: 04/18/2022 Elsevier Patient Education  2024 Elsevier Inc.   If you have been instructed to have an in-person evaluation today at a local Urgent Care facility, please use the link below. It will take you to a list of all of our available [facility], including address, phone number and hours of operation. Please do not delay care.  [facility]  If you or a family member do not have a primary care provider, use the link below to schedule a visit and establish care. When you choose a [facility] primary care physician or advanced practice provider, you gain a long-term partner in health. Find a Primary Care Provider  Learn more about [facility]'s in-office and virtual care options: [facility] - Get Care Now

## 2023-04-25 NOTE — Progress Notes (Signed)
 Virtual Visit Consent   Olivia Carr, you are scheduled for a virtual visit with a [facility] provider today. Just as with appointments in the office, your consent must be obtained to participate. Your consent will be active for this visit and any virtual visit you may have with one of our providers in the next 365 days. If you have a MyChart account, a copy of this consent can be sent to you electronically.  As this is a virtual visit, video technology does not allow for your provider to perform a traditional examination. This may limit your provider's ability to fully assess your condition. If your provider identifies any concerns that need to be evaluated in person or the need to arrange testing (such as labs, EKG, etc.), we will make arrangements to do so. Although advances in technology are sophisticated, we cannot ensure that it will always work on either your end or our end. If the connection with a video visit is poor, the visit may have to be switched to a telephone visit. With either a video or telephone visit, we are not always able to ensure that we have a secure connection.  By engaging in this virtual visit, you consent to the provision of healthcare and authorize for your insurance to be billed (if applicable) for the services provided during this visit. Depending on your insurance coverage, you may receive a charge related to this service.  I need to obtain your verbal consent now. Are you willing to proceed with your visit today? Naveen Lorusso has provided verbal consent on 04/25/2023 for a virtual visit (video or telephone). Margaretann Loveless, PA-C  Date: 04/25/2023 2:55 PM   Virtual Visit via Video Note   I, Margaretann Loveless, connected with  Olivia Carr  ([id number], 1960/04/23) on 04/25/23 at  3:00 PM EDT by a video-enabled telemedicine application and verified that I am speaking with the correct person using two  identifiers.  Location: Patient: Virtual Visit Location Patient: Home Provider: Virtual Visit Location Provider: Home Office   I discussed the limitations of evaluation and management by telemedicine and the availability of in person appointments. The patient expressed understanding and agreed to proceed.    History of Present Illness: [name] is a 63 y.o. who identifies as a female who was assigned female at birth, and is being seen today for dysuria.  HPI: Urinary Tract Infection  This is a new problem. The current episode started in the past 7 days (past 2 days). The problem occurs every urination. The problem has been gradually worsening. The quality of the pain is described as aching and burning. The patient is experiencing no pain. There has been no fever. Associated symptoms include urgency. Pertinent negatives include no chills, flank pain, frequency, hematuria, hesitancy, nausea or vomiting. Treatments tried: AZO. The treatment provided no relief.  At home test positive for Leuks and Nitrites    Problems:  Patient Active Problem List   Diagnosis Date Noted   COVID-19 virus infection 02/06/2020   Coccydynia 03/03/2014   Anxiety 01/19/2013   Perimenopause 01/19/2012   Acute upper respiratory infection 04/06/2010   Asthma with exacerbation 04/06/2010   BENIGN NEOPLASM OF PINEAL GLAND 09/24/2009   GASTROENTERITIS 07/13/2009   SORE THROAT 07/05/2009   FEVER UNSPECIFIED 07/05/2009   ASTHMA 01/22/2008   HYPERTENSION 10/12/2006   ALLERGIC RHINITIS 10/12/2006   GERD 10/08/2006   PNEUMONIA, HX OF 10/08/2006    Allergies:  Allergies  Allergen Reactions  Nitrofurantoin    Other     Compazine Uncontrolled eye & tongue movements   Sulfa Antibiotics    Medications:  Current Outpatient Medications:    cephALEXin (KEFLEX) 500 MG capsule, Take 1 capsule (500 mg total) by mouth 2 (two) times daily., Disp: 14 capsule, Rfl: 0   albuterol (VENTOLIN HFA) 108 (90  Base) MCG/ACT inhaler, Inhale 1-2 puffs into the lungs every 6 (six) hours as needed for wheezing or shortness of breath., Disp: , Rfl:    amoxicillin-clavulanate (AUGMENTIN) 875-125 MG tablet, SMARTSIG:1 Tablet(s) By Mouth Every 12 Hours, Disp: , Rfl:    benzonatate (TESSALON) 100 MG capsule, Take 1 capsule (100 mg total) by mouth every 8 (eight) hours., Disp: 21 capsule, Rfl: 0   Calcium Carbonate-Vit D-Min (CALCIUM 1200 PO), Take 1 tablet by mouth every evening. , Disp: , Rfl:    cetirizine (ZYRTEC) 10 MG tablet, Take 10 mg by mouth daily., Disp: , Rfl:    EPINEPHrine 0.3 mg/0.3 mL IJ SOAJ injection, SMARTSIG:1 Auto-Injector Injection PRN, Disp: , Rfl:    esomeprazole (NEXIUM) 40 MG capsule, Take 1 capsule (40 mg total) by mouth daily., Disp: 90 capsule, Rfl: 3   fluticasone (FLONASE) 50 MCG/ACT nasal spray, USE 2 SPRAYS IN EACH NOSTRIL DAILY, Disp: 48 g, Rfl: 3   methylPREDNISolone (MEDROL DOSEPAK) 4 MG TBPK tablet, Take by mouth as directed., Disp: , Rfl:    metoprolol succinate (TOPROL-XL) 25 MG 24 hr tablet, Take 1 tablet (25 mg total) by mouth daily., Disp: 90 tablet, Rfl: 3   potassium chloride (KLOR-CON M) 10 MEQ tablet, Take 1 tablet (10 mEq total) by mouth daily., Disp: 90 tablet, Rfl: 3  Observations/Objective: Patient is well-developed, well-nourished in no acute distress.  Resting comfortably at home.  Head is normocephalic, atraumatic.  No labored breathing.  Speech is clear and coherent with logical content.  Patient is alert and oriented at baseline.    Assessment and Plan: 1. Suspected UTI (Primary) - cephALEXin (KEFLEX) 500 MG capsule; Take 1 capsule (500 mg total) by mouth 2 (two) times daily.  Dispense: 14 capsule; Refill: 0  - Worsening symptoms.  - Will treat empirically with Keflex - May use AZO for bladder spasms - Continue to push fluids.  - Seek in person evaluation for urine culture if symptoms do not improve or if they worsen.    Follow Up  Instructions: I discussed the assessment and treatment plan with the patient. The patient was provided an opportunity to ask questions and all were answered. The patient agreed with the plan and demonstrated an understanding of the instructions.  A copy of instructions were sent to the patient via MyChart unless otherwise noted below.    The patient was advised to call back or seek an in-person evaluation if the symptoms worsen or if the condition fails to improve as anticipated.    Margaretann Loveless, PA-C

## 2023-04-28 ENCOUNTER — Telehealth: Payer: Self-pay | Admitting: Family Medicine

## 2023-04-28 NOTE — Telephone Encounter (Signed)
 Copied from CRM [id number]. Topic: Referral - Status >> Apr 28, 2023 11:55 AM Gurney Maxin H wrote: Reason for CRM: Patient is calling to check status of referral to ENT that was submitted, patient states that Dr. Caryl Never was going to give her the referral but Dr. Clent Ridges is patients primary care provider please reach out to patient with status of referral, thanks.  [facility] [phone]

## 2023-04-29 ENCOUNTER — Encounter: Payer: Self-pay | Admitting: Family Medicine

## 2023-04-29 NOTE — Telephone Encounter (Signed)
I did the referral to ENT  

## 2023-04-29 NOTE — Telephone Encounter (Signed)
 Please advise if ok for referral or schedule pt OV appointment

## 2023-05-05 ENCOUNTER — Encounter (INDEPENDENT_AMBULATORY_CARE_PROVIDER_SITE_OTHER): Payer: Self-pay

## 2023-05-07 NOTE — Telephone Encounter (Signed)
 Referral was placed by Dr Clent Ridges on 04/29/23

## 2023-05-19 ENCOUNTER — Ambulatory Visit (INDEPENDENT_AMBULATORY_CARE_PROVIDER_SITE_OTHER): Admitting: Physician Assistant

## 2023-05-19 ENCOUNTER — Encounter (INDEPENDENT_AMBULATORY_CARE_PROVIDER_SITE_OTHER): Payer: Self-pay

## 2023-05-19 ENCOUNTER — Ambulatory Visit (INDEPENDENT_AMBULATORY_CARE_PROVIDER_SITE_OTHER): Admitting: Audiology

## 2023-05-19 VITALS — BP 166/95 | HR 50 | Ht 65.0 in | Wt 212.0 lb

## 2023-05-19 DIAGNOSIS — H6991 Unspecified Eustachian tube disorder, right ear: Secondary | ICD-10-CM

## 2023-05-19 DIAGNOSIS — H903 Sensorineural hearing loss, bilateral: Secondary | ICD-10-CM

## 2023-05-19 DIAGNOSIS — H9011 Conductive hearing loss, unilateral, right ear, with unrestricted hearing on the contralateral side: Secondary | ICD-10-CM

## 2023-05-19 DIAGNOSIS — H6690 Otitis media, unspecified, unspecified ear: Secondary | ICD-10-CM

## 2023-05-19 DIAGNOSIS — H6501 Acute serous otitis media, right ear: Secondary | ICD-10-CM

## 2023-05-19 NOTE — Progress Notes (Signed)
 Dear Dr. Alyne Babinski, Here is my assessment for our mutual patient, Olivia Carr. Thank you for allowing me the opportunity to care for your patient. Please do not hesitate to contact me should you have any other questions. Sincerely, Belma Boxer PA-C  Otolaryngology Clinic Note Referring provider: Dr. Alyne Babinski HPI:  Olivia Carr is a 63 y.o. female kindly referred by Dr. Alyne Babinski   The patient is a 63 year old female presenting today with complaints of ear discomfort.  The patient notes that in January approximately 4 months ago she had a sinus infection, she notes that later on in the month she had a second upper respiratory infection and was diagnosed with pneumonia.  She was placed on antibiotics and a nebulizer.  She notes she did well until March when traveling on airplane, she felt her ears popping and felt as if she had fluid in both ears.  When she returned home she had a flare of seasonal allergies and felt that her left ear was becoming blocked again and felt another URI coming on.  She went to urgent care, she was placed on prednisone , given a shot of Depo-Medrol , she was noted to have fluid on both ears with redness.  She was placed on antibiotics, she notes her right ear was worse to the left.  She notes that symptoms seem to improve, in April she felt the clicking and popping in the ears again, she was taking meloxicam  at that time for a another issue and noted that the symptoms did seem to improve with meloxicam .  She notes overall today she is doing well, she has no significant pain, no significant hearing loss.  She feels that her ears are close to their baseline but still noticed some fullness right greater than left.  Associated ringing in the ears, no dizziness.  She notes a significant past medical history of seasonal allergies, she notes she has been using Flonase  twice a day for several weeks now along with Singulair  and Zyrtec.  She has a significant allergic history and is receiving  allergy shots weekly.  As a kid she does note that she had repeat ear infections, she denies any repeat ear infections adult.  No head or neck surgeries.  She recently retired.      Independent Review of Additional Tests or Records:  Audiological evaluation on 05/19/2023  Tympanometry: Right ear: Type B- Normal external ear canal volume with no middle ear pressure peak or tympanic membrane compliance Left ear: Type A- Normal external ear canal volume with normal middle ear pressure and tympanic membrane compliance   Pure tone Audiometry: Right ear-  Normal to borderline normal conductive hearing loss from [phone] Hz, then a mild presumably sensorineural hearing loss at 8000 Hz.     Left ear- Normal hearing from [phone] Hz, then a mild presumably sensorineural hearing loss at 8000 Hz.   Speech Audiometry: Right ear- Speech Reception Threshold (SRT) was obtained at 25 dBHL. Left ear-Speech Reception Threshold (SRT) was obtained at 10 dBHL.   Word Recognition Score Tested using NU-6 (MLV) Right ear: 100% was obtained at a presentation level of 50 dBHL with contralateral masking which is deemed as  excellent. Left ear: 100% was obtained at a presentation level of 50 dBHL with contralateral masking which is deemed as  excellent.   The hearing test results were completed under headphones and results are deemed to be of good reliability. Test technique:  conventional        PMH/Meds/All/SocHx/FamHx/ROS:   Past Medical  History:  Diagnosis Date   Allergic rhinitis    sees Dr. Almeda Jacobs    Asthma    sees Dr. Almeda Jacobs    Benign tumor of pineal gland [facility])     last MRI 10-10-09, stable, no follow up planned   DUB (dysfunctional uterine bleeding)    GERD (gastroesophageal reflux disease)    History of bone density study 03-22-13   normal    Hypertension    NSVD (normal spontaneous vaginal delivery)    X2   Pneumonia    Routine gynecological examination    sees Dr. Shila Door       Past Surgical History:  Procedure Laterality Date   ABDOMINAL HYSTERECTOMY     LAVH   ANTERIOR CRUCIATE LIGAMENT REPAIR     right knee with medial and lateral meniscectomies 09/04/08   COLONOSCOPY  05/06/2021   per Dr. Augustus Ledger Cherene Core), benign polyps, repeat in 5 yrs   DILATION AND CURETTAGE OF UTERUS     x 2   KNEE ARTHROSCOPY Left 2018   LIGAMENT REPAIR Left 08/24/2019   Procedure: LIGAMENT REPAIR;  Surgeon: Micheline Ahr, MD;  Location: [facility];  Service: Orthopedics;  Laterality: Left;   MYOMECTOMY ABDOMINAL APPROACH     RADIAL HEAD ARTHROPLASTY Left 08/24/2019   Procedure: RADIAL HEAD ARTHROPLASTY;  Surgeon: Micheline Ahr, MD;  Location: [facility];  Service: Orthopedics;  Laterality: Left;   TONSILLECTOMY     WISDOM TOOTH EXTRACTION      Family History  Problem Relation Age of Onset   Hypertension Mother    Cancer Father 34       PANCREATIC   Other Other        bowel disease   Diabetes Other    Hypertension Other    Coronary artery disease Other    Heart disease Maternal Grandmother    Cancer Maternal Grandfather        COLON   Heart disease Paternal Grandmother    Heart disease Paternal Grandfather    Immunocompromised Brother    Breast cancer Neg Hx      Social Connections: Socially Integrated (02/26/2023)   Social Connection and Isolation Panel [NHANES]    Frequency of Communication with Friends and Family: More than three times a week    Frequency of Social Gatherings with Friends and Family: Twice a week    Attends Religious Services: More than 4 times per year    Active Member of [employer] or Organizations: Yes    Attends Engineer, structural: More than 4 times per year    Marital Status: Living with partner      Current Outpatient Medications:    albuterol  (VENTOLIN  HFA) 108 (90 Base) MCG/ACT inhaler, Inhale 1-2 puffs into the lungs every 6 (six) hours as needed for wheezing or shortness of breath., Disp: , Rfl:     Calcium Carbonate-Vit D-Min (CALCIUM 1200 PO), Take 1 tablet by mouth every evening. , Disp: , Rfl:    cetirizine (ZYRTEC) 10 MG tablet, Take 10 mg by mouth daily., Disp: , Rfl:    EPINEPHrine  0.3 mg/0.3 mL IJ SOAJ injection, SMARTSIG:1 Auto-Injector Injection PRN, Disp: , Rfl:    esomeprazole  (NEXIUM ) 40 MG capsule, Take 1 capsule (40 mg total) by mouth daily., Disp: 90 capsule, Rfl: 3   fluticasone  (FLONASE ) 50 MCG/ACT nasal spray, USE 2 SPRAYS IN EACH NOSTRIL DAILY, Disp: 48 g, Rfl: 3   metoprolol  succinate (TOPROL -XL) 25 MG 24 hr tablet, Take  1 tablet (25 mg total) by mouth daily., Disp: 90 tablet, Rfl: 3   montelukast  (SINGULAIR ) 10 MG tablet, Take 10 mg by mouth at bedtime., Disp: , Rfl:    potassium chloride  (KLOR-CON  M) 10 MEQ tablet, Take 1 tablet (10 mEq total) by mouth daily., Disp: 90 tablet, Rfl: 3   amoxicillin -clavulanate (AUGMENTIN) 875-125 MG tablet, SMARTSIG:1 Tablet(s) By Mouth Every 12 Hours (Patient not taking: Reported on 05/19/2023), Disp: , Rfl:    benzonatate  (TESSALON ) 100 MG capsule, Take 1 capsule (100 mg total) by mouth every 8 (eight) hours. (Patient not taking: Reported on 05/19/2023), Disp: 21 capsule, Rfl: 0   cephALEXin  (KEFLEX ) 500 MG capsule, Take 1 capsule (500 mg total) by mouth 2 (two) times daily. (Patient not taking: Reported on 05/19/2023), Disp: 14 capsule, Rfl: 0   methylPREDNISolone  (MEDROL  DOSEPAK) 4 MG TBPK tablet, Take by mouth as directed. (Patient not taking: Reported on 05/19/2023), Disp: , Rfl:    Physical Exam:   BP (!) 166/95 (BP Location: Right Leg, Patient Position: Sitting, Cuff Size: Normal)   Pulse (!) 50   Ht 5\' 5"  (1.651 m)   Wt 212 lb (96.2 kg)   SpO2 98%   BMI 35.28 kg/m   Pertinent Findings  CN II-XII intact Bilateral EAC clear and TM intact with right well-pneumatized middle ear space, left with serous effusion Weber 512: equal Rinne 512: AC > BC b/l  Anterior rhinoscopy: Septum midline; bilateral inferior turbinates with minimal  hypertropy No lesions of oral cavity/oropharynx; dentition wnl No obviously palpable neck masses/lymphadenopathy/thyromegaly No respiratory distress or stridor  Seprately Identifiable Procedures:  None  Impression & Plans:  Jisell Majer is a 63 y.o. female with the following   Otitis media-  The patient presents today with ear fullness and several upper respiratory infections over the last several months.  She does have a left type B Tymp.  I believe the patient probably has eustachian tube dysfunction, she also has had several upper respiratory infections.  No infections noted on exam today, her audiogram does show an air-bone gap bilateral this is likely secondary to resolving fluid and eustachian tube dysfunction.  At this time I do feel the patient would benefit from continued medical management with Flonase  nasal saline irrigation as well as daily antihistamine.  I like to see her back in the office in 2 to 3 months with repeat audiogram to assure resolution of the fluid.  She will turn sooner as needed.  She verbalized understanding and agreement to today's plan.   - f/u follow-up 2 to 3 months with repeat audiogram   Thank you for allowing me the opportunity to care for your patient. Please do not hesitate to contact me should you have any other questions.  Sincerely, Belma Boxer PA-C [facility] Phone: [phone] Fax: [phone]  05/19/2023, 11:10 AM

## 2023-05-19 NOTE — Progress Notes (Signed)
  [address], [address] [place], [place] [address] [phone]  Audiological Evaluation    Name: Olivia Carr     DOB:   Dec 27, 1960      MRN:   [id number]                                                                                     Service Date: 05/19/2023     Accompanied by: unaccompanied    Patient comes today after Lorane Rocker, PA-C sent a referral for a hearing evaluation due to concerns with hearing loss.   Symptoms Yes Details  Hearing loss  [x]  Reports it has improved, still feels is not as good as it used to  Tinnitus  [x]  Both ears - gets louder at times ( maybe when having allergies or URI)  Ear pain/ infections/pressure  [x]  Reports right ear infection, followed by left ear - today it seems to be better but is worse in the right ear  Balance problems  []    Noise exposure history  []    Previous ear surgeries  []    Family history of hearing loss  []    Amplification  []    Other  []      Otoscopy: Right ear: Clear external ear canals and notable landmarks visualized on the tympanic membrane. Left ear:  Clear external ear canals and notable landmarks visualized on the tympanic membrane.  Tympanometry: Right ear: Type B- Normal external ear canal volume with no middle ear pressure peak or tympanic membrane compliance Left ear: Type A- Normal external ear canal volume with normal middle ear pressure and tympanic membrane compliance  Pure tone Audiometry: Right ear-  Normal to borderline normal conductive hearing loss from [phone], then a mild presumably sensorineural hearing loss at 8000 Hz.     Left ear- Normal hearing from [phone], then a mild presumably sensorineural hearing loss at 8000 Hz.  Speech Audiometry: Right ear- Speech Reception Threshold (SRT) was obtained at 25 dBHL. Left ear-Speech Reception Threshold (SRT) was obtained at 10 dBHL.   Word Recognition Score Tested using NU-6 (MLV) Right ear: 100% was obtained at a  presentation level of 50 dBHL with contralateral masking which is deemed as  excellent. Left ear: 100% was obtained at a presentation level of 50 dBHL with contralateral masking which is deemed as  excellent.   The hearing test results were completed under headphones and results are deemed to be of good reliability. Test technique:  conventional     Recommendations: Follow up with ENT as scheduled for today. Repeat audiogram after medical care.   Dovid Bartko MARIE LEROUX-MARTINEZ, AUD

## 2023-05-20 ENCOUNTER — Ambulatory Visit
Admission: RE | Admit: 2023-05-20 | Discharge: 2023-05-20 | Disposition: A | Discharge status: Home / Self Care | Source: Ambulatory Visit | Attending: Family Medicine | Admitting: Family Medicine

## 2023-05-20 DIAGNOSIS — G519 Disorder of facial nerve, unspecified: Secondary | ICD-10-CM

## 2023-05-25 ENCOUNTER — Ambulatory Visit (INDEPENDENT_AMBULATORY_CARE_PROVIDER_SITE_OTHER): Payer: 59 | Admitting: Obstetrics and Gynecology

## 2023-05-25 ENCOUNTER — Encounter: Payer: Self-pay | Admitting: Audiology

## 2023-05-25 VITALS — BP 128/82 | HR 77 | Ht 64.57 in | Wt 213.0 lb

## 2023-05-25 DIAGNOSIS — E2839 Other primary ovarian failure: Secondary | ICD-10-CM | POA: Diagnosis not present

## 2023-05-25 DIAGNOSIS — E041 Nontoxic single thyroid nodule: Secondary | ICD-10-CM

## 2023-05-25 DIAGNOSIS — Z01419 Encounter for gynecological examination (general) (routine) without abnormal findings: Secondary | ICD-10-CM

## 2023-05-25 DIAGNOSIS — N8110 Cystocele, unspecified: Secondary | ICD-10-CM | POA: Diagnosis not present

## 2023-05-25 DIAGNOSIS — N3941 Urge incontinence: Secondary | ICD-10-CM

## 2023-05-25 DIAGNOSIS — Z1331 Encounter for screening for depression: Secondary | ICD-10-CM

## 2023-05-25 NOTE — Progress Notes (Signed)
 63 y.o. y.o. female here for annual exam. No LMP recorded. Patient has had a hysterectomy.     [id number] Married.  Daughters 8 and 21 yo.  2 grand-children.   RP:  Established patient presenting for annual gyn exam    HPI: S/P LAVH.  Postmenopause, well on no hormone replacement therapy.  No pelvic pain.  No pain with intercourse. Pap Neg 02/2020. No indication to repeat Pap at this time. No prior abnormal pap smears. Breasts normal. Mammo Neg 04/2023.  Body mass index 36.5 last visit.  BD Normal 03/2015. 05/25/23 repeat ordered. Mother with several fractures and osteoporosis. Possible thyroid  nodule on exam: US  placed.  Health labs with Dr. Carmelo Chock.  Colonoscopy 03/2021 2 polyps removed. Repeat in 5 years. Body mass index is 35.92 kg/m.     05/25/2023    1:39 PM 01/02/2023    8:32 AM 12/31/2021    8:53 AM  Depression screen PHQ 2/9  Decreased Interest 0 0 0  Down, Depressed, Hopeless 0 0 0  PHQ - 2 Score 0 0 0  Altered sleeping  1 1  Tired, decreased energy  0 0  Change in appetite  0 1  Feeling bad or failure about yourself   0 0  Trouble concentrating  1 0  Moving slowly or fidgety/restless  0 0  Suicidal thoughts  0 0  PHQ-9 Score  2 2  Difficult doing work/chores  Somewhat difficult Not difficult at all    Blood pressure 128/82, pulse 77, height 5' 4.57" (1.64 m), weight 213 lb (96.6 kg), SpO2 100%.  No results found for: "DIAGPAP", "HPVHIGH", "ADEQPAP"  GYN HISTORY: No results found for: "DIAGPAP", "HPVHIGH", "ADEQPAP"  OB History  Gravida Para Term Preterm AB Living  3 2 2  1 2   SAB IAB Ectopic Multiple Live Births  1    2    # Outcome Date GA Lbr Len/2nd Weight Sex Type Anes PTL Lv  3 SAB           2 Term     F Vag-Spont  N LIV  1 Term     F Vag-Spont  N LIV    Past Medical History:  Diagnosis Date   Allergic rhinitis    sees Dr. Almeda Jacobs    Asthma    sees Dr. Almeda Jacobs    Benign tumor of pineal gland (HCC)     last MRI 10-10-09, stable, no follow up planned    DUB (dysfunctional uterine bleeding)    GERD (gastroesophageal reflux disease)    History of bone density study 03-22-13   normal    Hypertension    NSVD (normal spontaneous vaginal delivery)    X2   Pneumonia    Routine gynecological examination    sees Dr. Shila Door     Past Surgical History:  Procedure Laterality Date   ABDOMINAL HYSTERECTOMY     LAVH   ANTERIOR CRUCIATE LIGAMENT REPAIR     right knee with medial and lateral meniscectomies 09/04/08   COLONOSCOPY  05/06/2021   per Dr. Augustus Ledger Cherene Core), benign polyps, repeat in 5 yrs   DILATION AND CURETTAGE OF UTERUS     x 2   KNEE ARTHROSCOPY Left 2018   LIGAMENT REPAIR Left 08/24/2019   Procedure: LIGAMENT REPAIR;  Surgeon: Micheline Ahr, MD;  Location: [facility];  Service: Orthopedics;  Laterality: Left;   MYOMECTOMY ABDOMINAL APPROACH     RADIAL HEAD ARTHROPLASTY Left 08/24/2019   Procedure:  RADIAL HEAD ARTHROPLASTY;  Surgeon: Micheline Ahr, MD;  Location: [facility];  Service: Orthopedics;  Laterality: Left;   TONSILLECTOMY     WISDOM TOOTH EXTRACTION      Current Outpatient Medications on File Prior to Visit  Medication Sig Dispense Refill   albuterol  (VENTOLIN  HFA) 108 (90 Base) MCG/ACT inhaler Inhale 1-2 puffs into the lungs every 6 (six) hours as needed for wheezing or shortness of breath.     Calcium Carbonate-Vit D-Min (CALCIUM 1200 PO) Take 1 tablet by mouth every evening.      cetirizine (ZYRTEC) 10 MG tablet Take 10 mg by mouth daily.     EPINEPHrine  0.3 mg/0.3 mL IJ SOAJ injection SMARTSIG:1 Auto-Injector Injection PRN     esomeprazole  (NEXIUM ) 40 MG capsule Take 1 capsule (40 mg total) by mouth daily. 90 capsule 3   fluticasone  (FLONASE ) 50 MCG/ACT nasal spray USE 2 SPRAYS IN EACH NOSTRIL DAILY 48 g 3   metoprolol  succinate (TOPROL -XL) 25 MG 24 hr tablet Take 1 tablet (25 mg total) by mouth daily. 90 tablet 3   montelukast  (SINGULAIR ) 10 MG tablet Take 10 mg by mouth at  bedtime.     potassium chloride  (KLOR-CON  M) 10 MEQ tablet Take 1 tablet (10 mEq total) by mouth daily. 90 tablet 3   No current facility-administered medications on file prior to visit.    Social History   Socioeconomic History   Marital status: Married    Spouse name: Not on file   Number of children: Not on file   Years of education: Not on file   Highest education level: Bachelor's degree (e.g., BA, AB, BS)  Occupational History   Not on file  Tobacco Use   Smoking status: Never   Smokeless tobacco: Never  Vaping Use   Vaping status: Never Used  Substance and Sexual Activity   Alcohol use: Yes    Alcohol/week: 7.0 standard drinks of alcohol    Types: 7 Standard drinks or equivalent per week    Comment: glass of wine each day   Drug use: No   Sexual activity: Yes    Partners: Male    Birth control/protection: Surgical    Comment: HYST. 1st intercourse- 22, partners- 1  Other Topics Concern   Not on file  Social History Narrative   Not on file   Social Drivers of Health   Financial Resource Strain: Low Risk  (02/26/2023)   Overall Financial Resource Strain (CARDIA)    Difficulty of Paying Living Expenses: Not hard at all  Food Insecurity: No Food Insecurity (02/26/2023)   Hunger Vital Sign    Worried About Running Out of Food in the Last Year: Never true    Ran Out of Food in the Last Year: Never true  Transportation Needs: No Transportation Needs (02/26/2023)   PRAPARE - Administrator, Civil Service (Medical): No    Lack of Transportation (Non-Medical): No  Physical Activity: Sufficiently Active (02/26/2023)   Exercise Vital Sign    Days of Exercise per Week: 3 days    Minutes of Exercise per Session: 60 min  Stress: No Stress Concern Present (02/26/2023)   [employer] of Occupational Health - Occupational Stress Questionnaire    Feeling of Stress : Only a little  Social Connections: Socially Integrated (02/26/2023)   Social Connection and  Isolation Panel [NHANES]    Frequency of Communication with Friends and Family: More than three times a week    Frequency of Social  Gatherings with Friends and Family: Twice a week    Attends Religious Services: More than 4 times per year    Active Member of [employer] or Organizations: Yes    Attends Engineer, structural: More than 4 times per year    Marital Status: Living with partner  Intimate Partner Violence: Not on file    Family History  Problem Relation Age of Onset   Hypertension Mother    Pancreatic cancer Father 35   Heart disease Maternal Grandmother    Colon cancer Maternal Grandfather    Heart disease Paternal Grandmother    Heart disease Paternal Grandfather    Immunocompromised Brother    Other Other        bowel disease   Diabetes Other    Hypertension Other    Coronary artery disease Other    Breast cancer Neg Hx    BRCA 1/2 Neg Hx      Allergies  Allergen Reactions   Nitrofurantoin    Other     Compazine Uncontrolled eye & tongue movements   Sulfa Antibiotics       Patient's last menstrual period was No LMP recorded. Patient has had a hysterectomy..            Review of Systems Alls systems reviewed and are negative.     Physical Exam Constitutional:      Appearance: Normal appearance.  Genitourinary:     Vulva normal.     No lesions in the vagina.     Right Labia: No rash, lesions or skin changes.    Left Labia: No lesions, skin changes or rash.    Vaginal cuff intact.    No vaginal discharge or tenderness.     Anterior vaginal prolapse present.    No vaginal atrophy present.     Right Adnexa: not absent.    Left Adnexa: not absent.    Cervix is absent.     Uterus is absent.  Breasts:    Right: Normal.     Left: Normal.  HENT:     Head: Normocephalic.  Neck:     Thyroid : Thyroid  mass present. No thyromegaly or thyroid  tenderness.     Comments: Possible small thyroid  nodules Cardiovascular:     Rate and Rhythm: Normal  rate and regular rhythm.     Heart sounds: Normal heart sounds, S1 normal and S2 normal.  Pulmonary:     Effort: Pulmonary effort is normal.     Breath sounds: Normal breath sounds and air entry.  Abdominal:     General: Bowel sounds are normal. There is no distension.     Palpations: Abdomen is soft. There is no mass.     Tenderness: There is no abdominal tenderness. There is no guarding or rebound.  Musculoskeletal:     Cervical back: Full passive range of motion without pain, normal range of motion and neck supple. No tenderness.     Right lower leg: No edema.     Left lower leg: No edema.  Neurological:     Mental Status: She is alert.  Skin:    General: Skin is warm.  Psychiatric:        Mood and Affect: Mood normal.        Behavior: Behavior normal.        Thought Content: Thought content normal.  Vitals and nursing note reviewed. Exam conducted with a chaperone present.       A:  Well Woman GYN exam                             P:        Pap smear not indicated Encouraged annual mammogram screening Colon cancer screening up-to-date DXA ordered today Labs and immunizations to do with PMD Discussed breast self exams Encouraged healthy lifestyle practices Encouraged Vit D and Calcium   No follow-ups on file.  Reinaldo Caras

## 2023-06-05 ENCOUNTER — Telehealth: Payer: Self-pay

## 2023-06-05 DIAGNOSIS — E041 Nontoxic single thyroid nodule: Secondary | ICD-10-CM

## 2023-06-05 NOTE — Telephone Encounter (Signed)
 Pt LVM in regards to referrals for UroGYN (scheduled), DXA (scheduled), but cannot find out where request for thyroid  US  was sent to and hasn't heard anything from anybody about it. Requesting assistance.  Location for order of US , Thyroid  changed from "external" to [facility].   Pt notified and voiced understanding. Encounter routed to provider for final review and closed.

## 2023-06-09 ENCOUNTER — Ambulatory Visit
Admission: RE | Admit: 2023-06-09 | Discharge: 2023-06-09 | Disposition: A | Discharge status: Home / Self Care | Source: Ambulatory Visit | Attending: Obstetrics and Gynecology | Admitting: Obstetrics and Gynecology

## 2023-06-09 DIAGNOSIS — E041 Nontoxic single thyroid nodule: Secondary | ICD-10-CM

## 2023-06-10 ENCOUNTER — Encounter: Payer: Self-pay | Admitting: Obstetrics and Gynecology

## 2023-06-10 ENCOUNTER — Ambulatory Visit (HOSPITAL_BASED_OUTPATIENT_CLINIC_OR_DEPARTMENT_OTHER)
Admission: RE | Admit: 2023-06-10 | Discharge: 2023-06-10 | Disposition: A | Discharge status: Home / Self Care | Source: Ambulatory Visit | Attending: Obstetrics and Gynecology | Admitting: Obstetrics and Gynecology

## 2023-06-10 ENCOUNTER — Encounter: Payer: Self-pay | Admitting: Family Medicine

## 2023-06-10 ENCOUNTER — Ambulatory Visit: Payer: Self-pay | Admitting: Obstetrics and Gynecology

## 2023-06-10 DIAGNOSIS — Z01419 Encounter for gynecological examination (general) (routine) without abnormal findings: Secondary | ICD-10-CM | POA: Diagnosis present

## 2023-06-10 DIAGNOSIS — E2839 Other primary ovarian failure: Secondary | ICD-10-CM | POA: Diagnosis present

## 2023-06-15 NOTE — Telephone Encounter (Signed)
 I read the US  report. The nodules are so tiny, no biopsy is recommended. As for her thyroid  function, this was checked in December and the level was normal

## 2023-06-19 ENCOUNTER — Encounter (INDEPENDENT_AMBULATORY_CARE_PROVIDER_SITE_OTHER): Payer: Self-pay | Admitting: Physician Assistant

## 2023-06-19 ENCOUNTER — Ambulatory Visit (INDEPENDENT_AMBULATORY_CARE_PROVIDER_SITE_OTHER): Admitting: Physician Assistant

## 2023-06-19 ENCOUNTER — Ambulatory Visit (INDEPENDENT_AMBULATORY_CARE_PROVIDER_SITE_OTHER): Admitting: Audiology

## 2023-06-19 VITALS — BP 131/84 | HR 79

## 2023-06-19 DIAGNOSIS — H9042 Sensorineural hearing loss, unilateral, left ear, with unrestricted hearing on the contralateral side: Secondary | ICD-10-CM

## 2023-06-19 DIAGNOSIS — Z09 Encounter for follow-up examination after completed treatment for conditions other than malignant neoplasm: Secondary | ICD-10-CM | POA: Diagnosis not present

## 2023-06-19 DIAGNOSIS — H6993 Unspecified Eustachian tube disorder, bilateral: Secondary | ICD-10-CM

## 2023-06-19 DIAGNOSIS — Z8669 Personal history of other diseases of the nervous system and sense organs: Secondary | ICD-10-CM | POA: Diagnosis not present

## 2023-06-19 NOTE — Progress Notes (Signed)
 Dear Dr. Alyne Babinski, Here is my assessment for our mutual patient, Olivia Carr. Thank you for allowing me the opportunity to care for your patient. Please do not hesitate to contact me should you have any other questions. Sincerely, Belma Boxer PA-C  Otolaryngology Clinic Note Referring provider: Dr. Alyne Babinski HPI:  Olivia Carr is a 63 y.o. female kindly referred by Dr. Alyne Babinski   The patient is a 63 year old female seen in office for follow-up evaluation of eustachian tube dysfunction.  The patient was last seen in the office on 05/19/2023.  Below is a recap of that encounter.  The patient is a 63 year old female presenting today with complaints of ear discomfort.  The patient notes that in January approximately 4 months ago she had a sinus infection, she notes that later on in the month she had a second upper respiratory infection and was diagnosed with pneumonia.  She was placed on antibiotics and a nebulizer.  She notes she did well until March when traveling on airplane, she felt her ears popping and felt as if she had fluid in both ears.  When she returned home she had a flare of seasonal allergies and felt that her left ear was becoming blocked again and felt another URI coming on.  She went to urgent care, she was placed on prednisone , given a shot of Depo-Medrol , she was noted to have fluid on both ears with redness.  She was placed on antibiotics, she notes her right ear was worse to the left.  She notes that symptoms seem to improve, in April she felt the clicking and popping in the ears again, she was taking meloxicam  at that time for a another issue and noted that the symptoms did seem to improve with meloxicam .  She notes overall today she is doing well, she has no significant pain, no significant hearing loss.  She feels that her ears are close to their baseline but still noticed some fullness right greater than left.  Associated ringing in the ears, no dizziness.  She notes a significant past  medical history of seasonal allergies, she notes she has been using Flonase  twice a day for several weeks now along with Singulair  and Zyrtec.  She has a significant allergic history and is receiving allergy shots weekly.  As a kid she does note that she had repeat ear infections, she denies any repeat ear infections adult.  No head or neck surgeries.  She recently retired.   Right tympanometry type B on 05/19/2023  Update 06/19/2023  Since her last office visit she notes she has been doing well.  She has been traveling and flew to [place].  She notes she did have some pressure on the plane but notes her ears equalize fairly well.  She used Afrin before the flight and has continued to use Flonase  1 time daily.  She notes that her allergies are well-controlled with the Flonase .  She denies any pain in the ears, she notes that her right ear does feel much better but does note some persistent fullness.  She denies any significant hearing loss.  No recent infections.    Independent Review of Additional Tests or Records:  Audiological evaluation of 06/19/2023  Bilateral type a tympanometry  Normal audiological evaluation  PMH/Meds/All/SocHx/FamHx/ROS:   Past Medical History:  Diagnosis Date   Allergic rhinitis    sees Dr. Almeda Jacobs    Asthma    sees Dr. Almeda Jacobs    Benign tumor of pineal gland (HCC)  last MRI 10-10-09, stable, no follow up planned   DUB (dysfunctional uterine bleeding)    GERD (gastroesophageal reflux disease)    History of bone density study 03-22-13   normal    Hypertension    NSVD (normal spontaneous vaginal delivery)    X2   Pneumonia    Routine gynecological examination    sees Dr. Shila Door      Past Surgical History:  Procedure Laterality Date   ABDOMINAL HYSTERECTOMY     LAVH   ANTERIOR CRUCIATE LIGAMENT REPAIR     right knee with medial and lateral meniscectomies 09/04/08   COLONOSCOPY  05/06/2021   per Dr. Augustus Ledger Cherene Core), benign polyps, repeat in 5 yrs    DILATION AND CURETTAGE OF UTERUS     x 2   KNEE ARTHROSCOPY Left 2018   LIGAMENT REPAIR Left 08/24/2019   Procedure: LIGAMENT REPAIR;  Surgeon: Micheline Ahr, MD;  Location: [facility];  Service: Orthopedics;  Laterality: Left;   MYOMECTOMY ABDOMINAL APPROACH     RADIAL HEAD ARTHROPLASTY Left 08/24/2019   Procedure: RADIAL HEAD ARTHROPLASTY;  Surgeon: Micheline Ahr, MD;  Location:  SURGERY CENTER;  Service: Orthopedics;  Laterality: Left;   TONSILLECTOMY     WISDOM TOOTH EXTRACTION      Family History  Problem Relation Age of Onset   Hypertension Mother    Pancreatic cancer Father 42   Heart disease Maternal Grandmother    Colon cancer Maternal Grandfather    Heart disease Paternal Grandmother    Heart disease Paternal Grandfather    Immunocompromised Brother    Other Other        bowel disease   Diabetes Other    Hypertension Other    Coronary artery disease Other    Breast cancer Neg Hx    BRCA 1/2 Neg Hx      Social Connections: Socially Integrated (02/26/2023)   Social Connection and Isolation Panel [NHANES]    Frequency of Communication with Friends and Family: More than three times a week    Frequency of Social Gatherings with Friends and Family: Twice a week    Attends Religious Services: More than 4 times per year    Active Member of [employer] or Organizations: Yes    Attends Engineer, structural: More than 4 times per year    Marital Status: Living with partner      Current Outpatient Medications:    albuterol  (VENTOLIN  HFA) 108 (90 Base) MCG/ACT inhaler, Inhale 1-2 puffs into the lungs every 6 (six) hours as needed for wheezing or shortness of breath., Disp: , Rfl:    Calcium Carbonate-Vit D-Min (CALCIUM 1200 PO), Take 1 tablet by mouth every evening. , Disp: , Rfl:    cetirizine (ZYRTEC) 10 MG tablet, Take 10 mg by mouth daily., Disp: , Rfl:    EPINEPHrine  0.3 mg/0.3 mL IJ SOAJ injection, SMARTSIG:1 Auto-Injector Injection PRN,  Disp: , Rfl:    esomeprazole  (NEXIUM ) 40 MG capsule, Take 1 capsule (40 mg total) by mouth daily., Disp: 90 capsule, Rfl: 3   fluticasone  (FLONASE ) 50 MCG/ACT nasal spray, USE 2 SPRAYS IN EACH NOSTRIL DAILY, Disp: 48 g, Rfl: 3   metoprolol  succinate (TOPROL -XL) 25 MG 24 hr tablet, Take 1 tablet (25 mg total) by mouth daily., Disp: 90 tablet, Rfl: 3   montelukast  (SINGULAIR ) 10 MG tablet, Take 10 mg by mouth at bedtime., Disp: , Rfl:    potassium chloride  (KLOR-CON  M) 10 MEQ tablet, Take 1  tablet (10 mEq total) by mouth daily., Disp: 90 tablet, Rfl: 3   Physical Exam:   There were no vitals taken for this visit.  Pertinent Findings  CN II-XII intact Bilateral EAC clear and TM intact with well pneumatized middle ear spaces Anterior rhinoscopy: Septum midline; bilateral inferior turbinates with no hypertrophy No lesions of oral cavity/oropharynx; dentition in normal limits No obviously palpable neck masses/lymphadenopathy/thyromegaly No respiratory distress or stridor  Seprately Identifiable Procedures:  None  Impression & Plans:  Olivia Carr is a 63 y.o. female with the following   Eustachian tube dysfunction-  Patient presents today for follow-up evaluation of eustachian tube dysfunction right sided effusion.  She is doing much better today, her allergies are well-controlled.  She has been able to fly without significant difficulty.  Her right sided effusion has resolved.  She has not had any recent infections.  I would recommend she continue using the Flonase , Afrin as needed for flying but no longer than 3 days.  If she develops any new or worsening signs or symptoms I like to see her back in the office.  She verbalized understanding and agreement to today's plan.   - f/u PRN   Thank you for allowing me the opportunity to care for your patient. Please do not hesitate to contact me should you have any other questions.  Sincerely, Belma Boxer PA-C [facility] Phone: [phone] Fax: [phone]  06/19/2023, 1:16 PM

## 2023-06-19 NOTE — Progress Notes (Signed)
  [address], [address] [place], [place] [address] [phone]  Audiological Evaluation    Name: Olivia Carr     DOB:   04-Apr-1960      MRN:   [id number]                                                                                     Service Date: 06/19/2023     Accompanied by: unaccompanied    Patient comes today after Lorane Rocker, PA-C sent a referral for a hearing evaluation due to concerns with Eustachian tube dysfunction.   Symptoms Yes Details  Hearing loss  [x]  05-19-23: Right ear-  Normal to borderline normal conductive hearing loss from [phone], then a mild presumably sensorineural hearing loss at 8000 Hz.     Left ear- Normal hearing from [phone], then a mild presumably sensorineural hearing loss at 8000 Hz.  Tinnitus  []    Ear pain/ infections/pressure  []    Balance problems  []    Noise exposure history  []    Previous ear surgeries  []    Family history of hearing loss  []    Amplification  []    Other  []      Otoscopy: Right ear: Clear external ear canals and notable landmarks visualized on the tympanic membrane. Left ear:  Clear external ear canals and notable landmarks visualized on the tympanic membrane.  Tympanometry: Right ear: Type A- Normal external ear canal volume with normal middle ear pressure and tympanic membrane compliance. Left ear: Type A- Normal external ear canal volume with normal middle ear pressure and tympanic membrane compliance.    Pure tone Audiometry: Right ear- Normal to borderline normal hearing from [phone] Hz.  Left ear-   Normal hearing from [phone], then mild presumably sensorineural hearing loss at 8000 Hz.  Speech Audiometry: Right ear- Speech Reception Threshold (SRT) was obtained at 10 dBHL. Left ear-Speech Reception Threshold (SRT) was obtained at 10 dBHL.   Word Recognition Score Tested using NU-6 (MLV) Right ear: 100% was obtained at a presentation level of 50 dBHL with contralateral  masking which is deemed as  excellent. Left ear: 100% was obtained at a presentation level of 50 dBHL with contralateral masking which is deemed as  excellent.   The hearing test results were completed under headphones and results are deemed to be of good reliability. Test technique:  conventional    Impression: There was a significant improvement in pure-tone thresholds for the right ear  today when compared to the previous audiogram on file.   Recommendations: Follow up with ENT as scheduled for today. Return for a hearing evaluation in two years, before if concerns with hearing changes arise or per MD recommendation.   Sirenia Whitis MARIE LEROUX-MARTINEZ, AUD

## 2023-07-03 ENCOUNTER — Other Ambulatory Visit: Payer: Self-pay

## 2023-07-03 ENCOUNTER — Ambulatory Visit: Attending: Orthopedic Surgery | Admitting: Physical Therapy

## 2023-07-03 ENCOUNTER — Encounter: Payer: Self-pay | Admitting: Physical Therapy

## 2023-07-03 DIAGNOSIS — M25571 Pain in right ankle and joints of right foot: Secondary | ICD-10-CM | POA: Diagnosis present

## 2023-07-03 DIAGNOSIS — R293 Abnormal posture: Secondary | ICD-10-CM | POA: Insufficient documentation

## 2023-07-03 DIAGNOSIS — M76821 Posterior tibial tendinitis, right leg: Secondary | ICD-10-CM | POA: Insufficient documentation

## 2023-07-03 DIAGNOSIS — M6281 Muscle weakness (generalized): Secondary | ICD-10-CM | POA: Insufficient documentation

## 2023-07-03 NOTE — Therapy (Signed)
 OUTPATIENT PHYSICAL THERAPY LOWER EXTREMITY EVALUATION   Patient Name: Olivia Carr MRN: [id number] DOB:19-Apr-1960, 63 y.o., female Today's Date: 07/03/2023  END OF SESSION:  PT End of Session - 07/03/23 1229     Visit Number 1    Date for PT Re-Evaluation 08/28/23    Authorization Type [employer]    PT Start Time 0800    PT Stop Time 0850    PT Time Calculation (min) 50 min    Activity Tolerance Patient tolerated treatment well    Behavior During Therapy WFL for tasks assessed/performed             Past Medical History:  Diagnosis Date   Allergic rhinitis    sees Dr. Almeda Jacobs    Asthma    sees Dr. Almeda Jacobs    Benign tumor of pineal gland (HCC)     last MRI 10-10-09, stable, no follow up planned   DUB (dysfunctional uterine bleeding)    GERD (gastroesophageal reflux disease)    History of bone density study 03-22-13   normal    Hypertension    NSVD (normal spontaneous vaginal delivery)    X2   Pneumonia    Routine gynecological examination    sees Dr. Shila Door    Past Surgical History:  Procedure Laterality Date   ABDOMINAL HYSTERECTOMY     LAVH   ANTERIOR CRUCIATE LIGAMENT REPAIR     right knee with medial and lateral meniscectomies 09/04/08   COLONOSCOPY  05/06/2021   per Dr. Augustus Ledger Cherene Core), benign polyps, repeat in 5 yrs   DILATION AND CURETTAGE OF UTERUS     x 2   KNEE ARTHROSCOPY Left 2018   LIGAMENT REPAIR Left 08/24/2019   Procedure: LIGAMENT REPAIR;  Surgeon: Micheline Ahr, MD;  Location: [facility];  Service: Orthopedics;  Laterality: Left;   MYOMECTOMY ABDOMINAL APPROACH     RADIAL HEAD ARTHROPLASTY Left 08/24/2019   Procedure: RADIAL HEAD ARTHROPLASTY;  Surgeon: Micheline Ahr, MD;  Location: [facility];  Service: Orthopedics;  Laterality: Left;   TONSILLECTOMY     WISDOM TOOTH EXTRACTION     Patient Active Problem List   Diagnosis Date Noted   COVID-19 virus infection 02/06/2020   Coccydynia 03/03/2014    Anxiety 01/19/2013   Perimenopause 01/19/2012   Acute upper respiratory infection 04/06/2010   Asthma with exacerbation 04/06/2010   BENIGN NEOPLASM OF PINEAL GLAND 09/24/2009   GASTROENTERITIS 07/13/2009   SORE THROAT 07/05/2009   FEVER UNSPECIFIED 07/05/2009   ASTHMA 01/22/2008   HYPERTENSION 10/12/2006   ALLERGIC RHINITIS 10/12/2006   GERD 10/08/2006   PNEUMONIA, HX OF 10/08/2006    PCP: Corita Diego, MD  REFERRING PROVIDER: Osa Blase, MD  REFERRING DIAG: posterior tibial tendonitis   THERAPY DIAG:  Pain in right ankle and joints of right foot  Muscle weakness (generalized)  Abnormal posture  Rationale for Evaluation and Treatment: Rehabilitation  ONSET DATE: 6+ months  SUBJECTIVE:   SUBJECTIVE STATEMENT: Has been told she has some arthritis in both feet - has pain and swelling in both ankles/feet.   The right side has ongoing sharp burning medial pain and swelling.  A chronic nuisance. Just started using Rt ankle brace which has helped and I use an arch support brace on Lt side. I am low impact only for exercise - I walk several times a week 2-3 miles and go to gym several times a week for small group training for strength/stability.  I have bad knees.  Can't do any deep squats. I'm back on Meloxicam  this week.  PERTINENT HISTORY: Using Rt ankle brace and Lt arch support brace (copper elastic) ACL repair Rt knee with medial and lateral menisectomies 2010 Left knee scope years ago with ongoing Baker's cyst  PAIN:  PAIN:  Are you having pain? Yes NPRS scale: 0-3/10 Pain location: Rt  Pain orientation: Right  PAIN TYPE: burning, sharp, and tight Pain description: intermittent  Aggravating factors: unsure Relieving factors: so far the brace is helping   PRECAUTIONS: per MD low impact activiites only due to knee and ankle histories  RED FLAGS: None   WEIGHT BEARING RESTRICTIONS: No  FALLS:  Has patient fallen in last 6 months? No  LIVING  ENVIRONMENT: Lives with: lives with their spouse Lives in: House/apartment Stairs: Yes: Internal: 13 steps; on right going up and External: 4 steps; on right going up Has following equipment at home: None  OCCUPATION: recently retired  PLOF: Independent  PATIENT GOALS: figure out how to eliminate inflammation and minimize pain, figure out proper activities  NEXT MD VISIT: July 2  OBJECTIVE:  Note: Objective measures were completed at Evaluation unless otherwise noted.  DIAGNOSTIC FINDINGS:  Bone density May 2025:  IMPRESSION: Normal based on BMD. Fracture risk is unknown due to history of bone building therapy.  PATIENT SURVEYS:  LEFS: 46/80  COGNITION: Overall cognitive status: Within functional limits for tasks assessed     SENSATION: Gets tingling/numbness on dorsal aspect of feet near toes  EDEMA:  Observable pitting edema in distal Rt LE when Pt took off ankle brace   MUSCLE LENGTH: Hamstrings: WNL Bil piriformis tightness  POSTURE: increased thoracic kyphosis and Rt hip externally rotated with overpronation on Rt compared to Lt which is also pronated  PALPATION: Very tender Rt posterior tib muscle belly > tendon, with trigger points Tender bil medial gastroc with TP  LOWER EXTREMITY ROM: WFL bil  LOWER EXTREMITY MMT: Rt knee 4-/5 - protective into knee extension Lt knee 4/5 Bil ankles 4-/5 inv/eversion Bil ankle DF 4+/5 Able to perform bil heel raise  GAIT: Distance walked:  Assistive device utilized: None Level of assistance: Complete Independence Comments: overpronation                                                                                                                                 TREATMENT DATE:  07/03/23: Options for OTC or custom orthotics discussed - Pt may try OTC at [facility] or [facility] Pt education about DN - interested in adding this to treatment plan and understands it isn't covered by insurance Initiated HEP      PATIENT EDUCATION:  Education details: [employer] Person educated: Patient Education method: Programmer, multimedia, Facilities manager, Verbal cues, and Handouts Education comprehension: verbalized understanding and returned demonstration  HOME EXERCISE PROGRAM: Access Code: [email or web address] URL: [email or web address] Date: 07/03/2023 Prepared by: Minor Amble Mishael Krysiak  Exercises - Supine Piriformis Stretch with Leg Straight  - 1  x daily - 7 x weekly - 1 sets - 2 reps - 30 hold - Supine ITB Stretch with Strap  - 1 x daily - 7 x weekly - 1 sets - 2 reps - 30 hold - Gastroc Stretch on Wall  - 1 x daily - 7 x weekly - 1 sets - 2 reps - 30 hold  ASSESSMENT:  CLINICAL IMPRESSION: Patient is a 63 y.o. female who was seen today for physical therapy evaluation and treatment for Rt posterior tibial tendonitis. Pt has bil ankle arthritis and presents with Rt hip ER with signif overprontation Rt>Lt.  Her pain is described as chronic, ranging from 0-3/10, and presenting as shooting and sharp along medial aspect of Rt ankle with some tingling/numbness to dorsal aspect of foot/toes.  She has history of bil knee surgeries (Rt ACL and bil scopes).  She is active but maintains a low impact routine of walking and strength training and avoids squatting due to knee history.  Strength of bil LE range from 4-/5 to 4/5, with weakness mostly in Rt knee and ankle.  LEFS score is 46/80 demonstrating signif impact of symptoms on function.  Pt will benefit from skilled PT including DN, Pt education, inflammation reduction modalities and exercise to optimize her posture, mechanics and participation in daily activities.  OBJECTIVE IMPAIRMENTS: Abnormal gait, decreased activity tolerance, decreased knowledge of condition, decreased mobility, decreased strength, increased edema, increased muscle spasms, impaired flexibility, impaired sensation, impaired tone, improper body mechanics, postural dysfunction, and pain.   ACTIVITY  LIMITATIONS: sitting, standing, squatting, stairs, transfers, and locomotion level  PARTICIPATION LIMITATIONS: cleaning, laundry, community activity, and yard work  PERSONAL FACTORS: Time since onset of injury/illness/exacerbation and 1 comorbidity: bil knee pain with surgical history are also affecting patient's functional outcome.   REHAB POTENTIAL: Excellent  CLINICAL DECISION MAKING: Stable/uncomplicated  EVALUATION COMPLEXITY: Low   GOALS: Goals reviewed with patient? Yes  SHORT TERM GOALS: Target date: 07/31/23 Pt will be ind with initial HEP without exacerbation of symptoms Baseline: Goal status: INITIAL  2.  Pt will be educated on edema reduction techniques to use at home Baseline:  Goal status: INITIAL  3.  Pt will explore options for orthotic inserts to optimize foot mechanics whether it is OTC or custom made Baseline:  Goal status: INITIAL  4.  Pt will report reduced pain by at least 25% with daily activities. Baseline:  Goal status: INITIAL    LONG TERM GOALS: Target date: 08/28/23  Pt will be ind with advanced HEP and understand importance of continued compliance upon D/C from PT Baseline:  Goal status: INITIAL  2.  Pt will improve strength of Rt ankle to at least 4+/5 to improve dynamic support of ankle mechanics with walking Baseline:  Goal status: INITIAL  3.  Pt will improve LEFS score to at least 56/80 to demo improved function Baseline: 46/80 Goal status: INITIAL  4.  Pt will report at least 75% improvement in Rt ankle pain with daily activities and exericse. Baseline:  Goal status: INITIAL     PLAN:  PT FREQUENCY: 2x/week  PT DURATION: 8 weeks  PLANNED INTERVENTIONS: 97110-Therapeutic exercises, 97530- Therapeutic activity, [id number]- Neuromuscular re-education, 97535- Self Care, [address]- Manual therapy, [phone]- Gait training, [phone]- Aquatic Therapy, [phone]- Electrical stimulation (unattended), [phone]- Electrical stimulation (manual), [id number]-  Vasopneumatic device, [id number]- Ionotophoresis 4mg /ml Dexamethasone , 13086 (1-2 muscles), [id number] (3+ muscles)- Dry Needling, Patient/Family education, Taping, Joint mobilization, and Cryotherapy  PLAN FOR NEXT SESSION: DN Rt posterior tib, gastroc, soleus, review HEP,  discuss edema and pain reduction options (elevation, ice, ankle pumps, etc), progress Rt ankle strength as tol.  Pt needs low impact and avoidance of squats due to knees)   Nira Visscher, PT 07/03/23 12:30 PM

## 2023-07-07 NOTE — Therapy (Signed)
 OUTPATIENT PHYSICAL THERAPY LOWER EXTREMITY TREATMENT   Patient Name: Olivia Carr MRN: [id number] DOB:26-May-1960, 63 y.o., female Today's Date: 07/08/2023  END OF SESSION:  PT End of Session - 07/08/23 1148     Visit Number 2    Date for PT Re-Evaluation 08/28/23    Authorization Type [employer]    PT Start Time 1148    PT Stop Time 1231    PT Time Calculation (min) 43 min    Activity Tolerance Patient tolerated treatment well              Past Medical History:  Diagnosis Date   Allergic rhinitis    sees Dr. Almeda Jacobs    Asthma    sees Dr. Almeda Jacobs    Benign tumor of pineal gland (HCC)     last MRI 10-10-09, stable, no follow up planned   DUB (dysfunctional uterine bleeding)    GERD (gastroesophageal reflux disease)    History of bone density study 03-22-13   normal    Hypertension    NSVD (normal spontaneous vaginal delivery)    X2   Pneumonia    Routine gynecological examination    sees Dr. Shila Door    Past Surgical History:  Procedure Laterality Date   ABDOMINAL HYSTERECTOMY     LAVH   ANTERIOR CRUCIATE LIGAMENT REPAIR     right knee with medial and lateral meniscectomies 09/04/08   COLONOSCOPY  05/06/2021   per Dr. Augustus Ledger Cherene Core), benign polyps, repeat in 5 yrs   DILATION AND CURETTAGE OF UTERUS     x 2   KNEE ARTHROSCOPY Left 2018   LIGAMENT REPAIR Left 08/24/2019   Procedure: LIGAMENT REPAIR;  Surgeon: Micheline Ahr, MD;  Location: [facility];  Service: Orthopedics;  Laterality: Left;   MYOMECTOMY ABDOMINAL APPROACH     RADIAL HEAD ARTHROPLASTY Left 08/24/2019   Procedure: RADIAL HEAD ARTHROPLASTY;  Surgeon: Micheline Ahr, MD;  Location: [facility];  Service: Orthopedics;  Laterality: Left;   TONSILLECTOMY     WISDOM TOOTH EXTRACTION     Patient Active Problem List   Diagnosis Date Noted   COVID-19 virus infection 02/06/2020   Coccydynia 03/03/2014   Anxiety 01/19/2013   Perimenopause 01/19/2012   Acute  upper respiratory infection 04/06/2010   Asthma with exacerbation 04/06/2010   BENIGN NEOPLASM OF PINEAL GLAND 09/24/2009   GASTROENTERITIS 07/13/2009   SORE THROAT 07/05/2009   FEVER UNSPECIFIED 07/05/2009   ASTHMA 01/22/2008   HYPERTENSION 10/12/2006   ALLERGIC RHINITIS 10/12/2006   GERD 10/08/2006   PNEUMONIA, HX OF 10/08/2006    PCP: Corita Diego, MD  REFERRING PROVIDER: Osa Blase, MD  REFERRING DIAG: posterior tibial tendonitis   THERAPY DIAG:  Pain in right ankle and joints of right foot  Muscle weakness (generalized)  Abnormal posture  Rationale for Evaluation and Treatment: Rehabilitation  ONSET DATE: 6+ months  SUBJECTIVE:   SUBJECTIVE STATEMENT: Went to [employer] and ordered inserts and also got new Brooks. The pain is significantly better, but the swelling is still there. Her left knee is starting to hurt again due to these changes.    Eval: Has been told she has some arthritis in both feet - has pain and swelling in both ankles/feet.   The right side has ongoing sharp burning medial pain and swelling.  A chronic nuisance. Just started using Rt ankle brace which has helped and I use an arch support brace on Lt side. I am low impact only  for exercise - I walk several times a week 2-3 miles and go to gym several times a week for small group training for strength/stability.  I have bad knees.  Can't do any deep squats. I'm back on Meloxicam  this week.  PERTINENT HISTORY: Using Rt ankle brace and Lt arch support brace (copper elastic) ACL repair Rt knee with medial and lateral menisectomies 2010 Left knee scope years ago with ongoing Baker's cyst  PAIN:  PAIN:  Are you having pain? Yes NPRS scale: 0-3/10 Pain location: Rt  Pain orientation: Right  PAIN TYPE: burning, sharp, and tight Pain description: intermittent  Aggravating factors: unsure Relieving factors: so far the brace is helping   PRECAUTIONS: per MD low impact activiites only due to  knee and ankle histories  RED FLAGS: None   WEIGHT BEARING RESTRICTIONS: No  FALLS:  Has patient fallen in last 6 months? No  LIVING ENVIRONMENT: Lives with: lives with their spouse Lives in: House/apartment Stairs: Yes: Internal: 13 steps; on right going up and External: 4 steps; on right going up Has following equipment at home: None  OCCUPATION: recently retired  PLOF: Independent  PATIENT GOALS: figure out how to eliminate inflammation and minimize pain, figure out proper activities  NEXT MD VISIT: July 2  OBJECTIVE:  Note: Objective measures were completed at Evaluation unless otherwise noted.  DIAGNOSTIC FINDINGS:  Bone density May 2025:  IMPRESSION: Normal based on BMD. Fracture risk is unknown due to history of bone building therapy.  PATIENT SURVEYS:  LEFS: 46/80  COGNITION: Overall cognitive status: Within functional limits for tasks assessed     SENSATION: Gets tingling/numbness on dorsal aspect of feet near toes  EDEMA:  Observable pitting edema in distal Rt LE when Pt took off ankle brace   MUSCLE LENGTH: Hamstrings: WNL Bil piriformis tightness  POSTURE: increased thoracic kyphosis and Rt hip externally rotated with overpronation on Rt compared to Lt which is also pronated  PALPATION: Very tender Rt posterior tib muscle belly > tendon, with trigger points Tender bil medial gastroc with TP  LOWER EXTREMITY ROM: WFL bil  LOWER EXTREMITY MMT: Rt knee 4-/5 - protective into knee extension Lt knee 4/5 Bil ankles 4-/5 inv/eversion Bil ankle DF 4+/5 Able to perform bil heel raise  GAIT: Distance walked:  Assistive device utilized: None Level of assistance: Complete Independence Comments: overpronation                                                                                                                                 TREATMENT DATE:  07/08/23 Bike L3 x 5 min Supine pf stretch with leg straight Supine ITB stretch with strap x  60 sec B Gastroc stretch wall 2x30 sec B Trigger Point Dry Needling  Initial Treatment: Pt instructed on Dry Needling rational, procedures, and possible side effects. Pt instructed to expect mild to moderate muscle soreness later in the day and/or into the next day.  Pt instructed in methods to reduce muscle soreness. Pt instructed to continue prescribed HEP. Patient was educated on signs and symptoms of infection and other risk factors and advised to seek medical attention should they occur.  Patient verbalized understanding of these instructions and education.   Patient Verbal Consent Given: Yes Education Handout Provided: Yes Muscles Treated: R gastoc, soleus, post tibialis, flexor digitorum Electrical Stimulation Performed: No Treatment Response/Outcome: Utilized skilled palpation to identify bony landmarks and trigger points.  Able to illicit twitch response and muscle elongation.  Soft tissue mobilization to muscles needled to further promote tissue elongation and decreased pain.        DN  07/03/23: Options for OTC or custom orthotics discussed - Pt may try OTC at [facility] Pt education about DN - interested in adding this to treatment plan and understands it isn't covered by insurance Initiated HEP     PATIENT EDUCATION:  Education details: [employer] Person educated: Patient Education method: Programmer, multimedia, Demonstration, Verbal cues, and Handouts Education comprehension: verbalized understanding and returned demonstration  HOME EXERCISE PROGRAM: Access Code: [email or web address] URL: [email or web address] Date: 07/03/2023 Prepared by: Minor Amble Beuhring  Exercises - Supine Piriformis Stretch with Leg Straight  - 1 x daily - 7 x weekly - 1 sets - 2 reps - 30 hold - Supine ITB Stretch with Strap  - 1 x daily - 7 x weekly - 1 sets - 2 reps - 30 hold - Gastroc Stretch on Wall  - 1 x daily - 7 x weekly - 1 sets - 2 reps - 30 hold  ASSESSMENT:  CLINICAL  IMPRESSION: Pt with less pain today after getting new shoes and inserts. She had excellent response to DN in R lower leg especially post tibialis with twitch responses and elongation of tissues. She felt better immediately upon standing. Patient independent with initial HEP.   Eval: Patient is a 63 y.o. female who was seen today for physical therapy evaluation and treatment for Rt posterior tibial tendonitis. Pt has bil ankle arthritis and presents with Rt hip ER with signif overprontation Rt>Lt.  Her pain is described as chronic, ranging from 0-3/10, and presenting as shooting and sharp along medial aspect of Rt ankle with some tingling/numbness to dorsal aspect of foot/toes.  She has history of bil knee surgeries (Rt ACL and bil scopes).  She is active but maintains a low impact routine of walking and strength training and avoids squatting due to knee history.  Strength of bil LE range from 4-/5 to 4/5, with weakness mostly in Rt knee and ankle.  LEFS score is 46/80 demonstrating signif impact of symptoms on function.  Pt will benefit from skilled PT including DN, Pt education, inflammation reduction modalities and exercise to optimize her posture, mechanics and participation in daily activities.  OBJECTIVE IMPAIRMENTS: Abnormal gait, decreased activity tolerance, decreased knowledge of condition, decreased mobility, decreased strength, increased edema, increased muscle spasms, impaired flexibility, impaired sensation, impaired tone, improper body mechanics, postural dysfunction, and pain.   ACTIVITY LIMITATIONS: sitting, standing, squatting, stairs, transfers, and locomotion level  PARTICIPATION LIMITATIONS: cleaning, laundry, community activity, and yard work  PERSONAL FACTORS: Time since onset of injury/illness/exacerbation and 1 comorbidity: bil knee pain with surgical history are also affecting patient's functional outcome.   REHAB POTENTIAL: Excellent  CLINICAL DECISION MAKING:  Stable/uncomplicated  EVALUATION COMPLEXITY: Low   GOALS: Goals reviewed with patient? Yes  SHORT TERM GOALS: Target date: 07/31/23 Pt will be ind with initial HEP without exacerbation of symptoms Baseline:  Goal status: INITIAL  2.  Pt will be educated on edema reduction techniques to use at home Baseline:  Goal status: INITIAL  3.  Pt will explore options for orthotic inserts to optimize foot mechanics whether it is OTC or custom made Baseline:  Goal status: INITIAL  4.  Pt will report reduced pain by at least 25% with daily activities. Baseline:  Goal status: INITIAL    LONG TERM GOALS: Target date: 08/28/23  Pt will be ind with advanced HEP and understand importance of continued compliance upon D/C from PT Baseline:  Goal status: INITIAL  2.  Pt will improve strength of Rt ankle to at least 4+/5 to improve dynamic support of ankle mechanics with walking Baseline:  Goal status: INITIAL  3.  Pt will improve LEFS score to at least 56/80 to demo improved function Baseline: 46/80 Goal status: INITIAL  4.  Pt will report at least 75% improvement in Rt ankle pain with daily activities and exericse. Baseline:  Goal status: INITIAL     PLAN:  PT FREQUENCY: 2x/week  PT DURATION: 8 weeks  PLANNED INTERVENTIONS: 97110-Therapeutic exercises, 97530- Therapeutic activity, [id number]- Neuromuscular re-education, 97535- Self Care, 16109- Manual therapy, [phone]- Gait training, [phone]- Aquatic Therapy, [phone]- Electrical stimulation (unattended), [phone]- Electrical stimulation (manual), [id number]- Vasopneumatic device, [id number]- Ionotophoresis 4mg /ml Dexamethasone , 13086 (1-2 muscles), 20561 (3+ muscles)- Dry Needling, Patient/Family education, Taping, Joint mobilization, and Cryotherapy  PLAN FOR NEXT SESSION: DN Rt posterior tib, gastroc, soleus, review HEP, discuss edema and pain reduction options (elevation, ice, ankle pumps, etc), progress Rt ankle strength as tol.  Pt needs low impact and  avoidance of squats due to knees)   Jinx Mourning, PT 07/08/23 1:34 PM

## 2023-07-08 ENCOUNTER — Encounter: Payer: Self-pay | Admitting: Physical Therapy

## 2023-07-08 ENCOUNTER — Ambulatory Visit: Admitting: Physical Therapy

## 2023-07-08 DIAGNOSIS — M25571 Pain in right ankle and joints of right foot: Secondary | ICD-10-CM | POA: Diagnosis not present

## 2023-07-08 DIAGNOSIS — R293 Abnormal posture: Secondary | ICD-10-CM

## 2023-07-08 DIAGNOSIS — M6281 Muscle weakness (generalized): Secondary | ICD-10-CM

## 2023-07-08 NOTE — Patient Instructions (Signed)

## 2023-07-10 ENCOUNTER — Encounter: Payer: Self-pay | Admitting: Physical Therapy

## 2023-07-10 ENCOUNTER — Ambulatory Visit: Admitting: Physical Therapy

## 2023-07-10 DIAGNOSIS — M25571 Pain in right ankle and joints of right foot: Secondary | ICD-10-CM

## 2023-07-10 DIAGNOSIS — M6281 Muscle weakness (generalized): Secondary | ICD-10-CM

## 2023-07-10 DIAGNOSIS — R293 Abnormal posture: Secondary | ICD-10-CM

## 2023-07-10 NOTE — Therapy (Addendum)
 OUTPATIENT PHYSICAL THERAPY LOWER EXTREMITY TREATMENT   Patient Name: Pelagia Iacobucci MRN: [id number] DOB:Dec 12, 1960, 63 y.o., female Today's Date: 07/10/2023  END OF SESSION:  PT End of Session - 07/10/23 0803     Visit Number 3    Date for PT Re-Evaluation 08/28/23    Authorization Type [employer]    PT Start Time 0800    PT Stop Time 0845    PT Time Calculation (min) 45 min    Activity Tolerance Patient tolerated treatment well    Behavior During Therapy WFL for tasks assessed/performed            Past Medical History:  Diagnosis Date   Allergic rhinitis    sees Dr. Frutoso    Asthma    sees Dr. Frutoso    Benign tumor of pineal gland (HCC)     last MRI 10-10-09, stable, no follow up planned   DUB (dysfunctional uterine bleeding)    GERD (gastroesophageal reflux disease)    History of bone density study 03-22-13   normal    Hypertension    NSVD (normal spontaneous vaginal delivery)    X2   Pneumonia    Routine gynecological examination    sees Dr. Curlee Guan    Past Surgical History:  Procedure Laterality Date   ABDOMINAL HYSTERECTOMY     LAVH   ANTERIOR CRUCIATE LIGAMENT REPAIR     right knee with medial and lateral meniscectomies 09/04/08   COLONOSCOPY  05/06/2021   per Dr. Julianne Gwen), benign polyps, repeat in 5 yrs   DILATION AND CURETTAGE OF UTERUS     x 2   KNEE ARTHROSCOPY Left 2018   LIGAMENT REPAIR Left 08/24/2019   Procedure: LIGAMENT REPAIR;  Surgeon: Cristy Bonner DASEN, MD;  Location: [facility];  Service: Orthopedics;  Laterality: Left;   MYOMECTOMY ABDOMINAL APPROACH     RADIAL HEAD ARTHROPLASTY Left 08/24/2019   Procedure: RADIAL HEAD ARTHROPLASTY;  Surgeon: Cristy Bonner DASEN, MD;  Location: [facility];  Service: Orthopedics;  Laterality: Left;   TONSILLECTOMY     WISDOM TOOTH EXTRACTION     Patient Active Problem List   Diagnosis Date Noted   COVID-19 virus infection 02/06/2020   Coccydynia 03/03/2014    Anxiety 01/19/2013   Perimenopause 01/19/2012   Acute upper respiratory infection 04/06/2010   Asthma with exacerbation 04/06/2010   BENIGN NEOPLASM OF PINEAL GLAND 09/24/2009   GASTROENTERITIS 07/13/2009   SORE THROAT 07/05/2009   FEVER UNSPECIFIED 07/05/2009   ASTHMA 01/22/2008   HYPERTENSION 10/12/2006   ALLERGIC RHINITIS 10/12/2006   GERD 10/08/2006   PNEUMONIA, HX OF 10/08/2006    PCP: Garnette Olmsted, MD  REFERRING PROVIDER: Fonda Olmsted, MD  REFERRING DIAG: posterior tibial tendonitis   THERAPY DIAG:  Pain in right ankle and joints of right foot  Muscle weakness (generalized)  Abnormal posture  Rationale for Evaluation and Treatment: Rehabilitation  ONSET DATE: 6+ months  SUBJECTIVE:   SUBJECTIVE STATEMENT: I would like to add the Lt knee and ankle to my PT order since I can tell this is helping already and I have symptoms on that side too. The DN helped immediately.   Eval: Has been told she has some arthritis in both feet - has pain and swelling in both ankles/feet.   The right side has ongoing sharp burning medial pain and swelling.  A chronic nuisance. Just started using Rt ankle brace which has helped and I use an arch support brace on Lt  side. I am low impact only for exercise - I walk several times a week 2-3 miles and go to gym several times a week for small group training for strength/stability.  I have bad knees.  Can't do any deep squats. I'm back on Meloxicam  this week.  PERTINENT HISTORY: Using Rt ankle brace and Lt arch support brace (copper elastic) ACL repair Rt knee with medial and lateral menisectomies 2010 Left knee scope years ago with ongoing Baker's cyst  PAIN:  PAIN:  Are you having pain? Yes NPRS scale: 0-3/10 Pain location: Rt  Pain orientation: Right  PAIN TYPE: burning, sharp, and tight Pain description: intermittent  Aggravating factors: unsure Relieving factors: so far the brace is helping   PRECAUTIONS: per MD low impact  activiites only due to knee and ankle histories  RED FLAGS: None   WEIGHT BEARING RESTRICTIONS: No  FALLS:  Has patient fallen in last 6 months? No  LIVING ENVIRONMENT: Lives with: lives with their spouse Lives in: House/apartment Stairs: Yes: Internal: 13 steps; on right going up and External: 4 steps; on right going up Has following equipment at home: None  OCCUPATION: recently retired  PLOF: Independent  PATIENT GOALS: figure out how to eliminate inflammation and minimize pain, figure out proper activities  NEXT MD VISIT: July 2  OBJECTIVE:  Note: Objective measures were completed at Evaluation unless otherwise noted.  DIAGNOSTIC FINDINGS:  Bone density May 2025:  IMPRESSION: Normal based on BMD. Fracture risk is unknown due to history of bone building therapy.  PATIENT SURVEYS:  LEFS: 46/80  COGNITION: Overall cognitive status: Within functional limits for tasks assessed     SENSATION: Gets tingling/numbness on dorsal aspect of feet near toes  EDEMA:  Observable pitting edema in distal Rt LE when Pt took off ankle brace   MUSCLE LENGTH: Hamstrings: WNL Bil piriformis tightness  POSTURE: increased thoracic kyphosis and Rt hip externally rotated with overpronation on Rt compared to Lt which is also pronated  PALPATION: Very tender Rt posterior tib muscle belly > tendon, with trigger points Tender bil medial gastroc with TP  LOWER EXTREMITY ROM: WFL bil  LOWER EXTREMITY MMT: Rt knee 4-/5 - protective into knee extension Lt knee 4/5 Bil ankles 4-/5 inv/eversion Bil ankle DF 4+/5 Able to perform bil heel raise  GAIT: Distance walked:  Assistive device utilized: None Level of assistance: Complete Independence Comments: overpronation                                                                                                                                 TREATMENT DATE:  07/10/23: Bike L3 x5' Slant board gastroc stretch bil 2x30  Bil  heels raises x10, single heel raise x10 each side - much more fatigue and visible atrophy of Rt gastroc Seated ball squeeze 10x5 Red tband seated IR/ER Rt ankle 2x10 Discussion about edema management - elevation above heart, ankle pumps while elevated, use of ice -  10-15 min duration for ice - recommend Colpac oversize Discussion of use of compression socks and how she may need to get longer socks to come up to below knee vs mid-calf  07/08/23 Bike L3 x 5 min Supine pf stretch with leg straight Supine ITB stretch with strap x 60 sec B Gastroc stretch wall 2x30 sec B Trigger Point Dry Needling  Initial Treatment: Pt instructed on Dry Needling rational, procedures, and possible side effects. Pt instructed to expect mild to moderate muscle soreness later in the day and/or into the next day.  Pt instructed in methods to reduce muscle soreness. Pt instructed to continue prescribed HEP. Patient was educated on signs and symptoms of infection and other risk factors and advised to seek medical attention should they occur.  Patient verbalized understanding of these instructions and education.   Patient Verbal Consent Given: Yes Education Handout Provided: Yes Muscles Treated: R gastoc, soleus, post tibialis, flexor digitorum Electrical Stimulation Performed: No Treatment Response/Outcome: Utilized skilled palpation to identify bony landmarks and trigger points.  Able to illicit twitch response and muscle elongation.  Soft tissue mobilization to muscles needled to further promote tissue elongation and decreased pain.        DN  07/03/23: Options for OTC or custom orthotics discussed - Pt may try OTC at [facility] or [facility] Pt education about DN - interested in adding this to treatment plan and understands it isn't covered by insurance Initiated HEP     PATIENT EDUCATION:  Education details: [employer] Person educated: Patient Education method: Programmer, multimedia, Demonstration, Verbal cues,  and Handouts Education comprehension: verbalized understanding and returned demonstration  HOME EXERCISE PROGRAM: Access Code: [email or web address] URL: [email or web address] Date: 07/10/2023 Prepared by: Orvil Deunta Beneke  Exercises - Supine Piriformis Stretch with Leg Straight  - 1 x daily - 7 x weekly - 1 sets - 2 reps - 30 hold - Supine ITB Stretch with Strap  - 1 x daily - 7 x weekly - 1 sets - 2 reps - 30 hold - Gastroc Stretch on Wall  - 1 x daily - 7 x weekly - 1 sets - 2 reps - 30 hold - Heel Toe Raises with Counter Support  - 1 x daily - 7 x weekly - 2 sets - 10 reps - Single Leg Heel Raise with Counter Support  - 1 x daily - 7 x weekly - 1-2 sets - 10 reps - Seated Ankle Eversion with Resistance  - 1 x daily - 7 x weekly - 2 sets - 10 reps - Long Sitting Ankle Inversion with Anchored Resistance  - 1 x daily - 7 x weekly - 2 sets - 10 reps - Ankle Inversion with Anchored Resistance at Table  - 1 x daily - 7 x weekly - 2 sets - 10 reps  ASSESSMENT:  CLINICAL IMPRESSION: Pt reports signif improvement in pain with DN.  PT messaged MD to request including Lt side for treatment as well.  Progressed strength today with time spent to review proper technique.  Also discussed strategies for edema management.  Pt will continue to benefit from skilled intervention along treatment plan.  Eval: Patient is a 63 y.o. female who was seen today for physical therapy evaluation and treatment for Rt posterior tibial tendonitis. Pt has bil ankle arthritis and presents with Rt hip ER with signif overprontation Rt>Lt.  Her pain is described as chronic, ranging from 0-3/10, and presenting as shooting and sharp along medial aspect of Rt ankle with some tingling/numbness  to dorsal aspect of foot/toes.  She has history of bil knee surgeries (Rt ACL and bil scopes).  She is active but maintains a low impact routine of walking and strength training and avoids squatting due to knee history.  Strength of bil LE  range from 4-/5 to 4/5, with weakness mostly in Rt knee and ankle.  LEFS score is 46/80 demonstrating signif impact of symptoms on function.  Pt will benefit from skilled PT including DN, Pt education, inflammation reduction modalities and exercise to optimize her posture, mechanics and participation in daily activities.  OBJECTIVE IMPAIRMENTS: Abnormal gait, decreased activity tolerance, decreased knowledge of condition, decreased mobility, decreased strength, increased edema, increased muscle spasms, impaired flexibility, impaired sensation, impaired tone, improper body mechanics, postural dysfunction, and pain.   ACTIVITY LIMITATIONS: sitting, standing, squatting, stairs, transfers, and locomotion level  PARTICIPATION LIMITATIONS: cleaning, laundry, community activity, and yard work  PERSONAL FACTORS: Time since onset of injury/illness/exacerbation and 1 comorbidity: bil knee pain with surgical history are also affecting patient's functional outcome.   REHAB POTENTIAL: Excellent  CLINICAL DECISION MAKING: Stable/uncomplicated  EVALUATION COMPLEXITY: Low   GOALS: Goals reviewed with patient? Yes  SHORT TERM GOALS: Target date: 07/31/23 Pt will be ind with initial HEP without exacerbation of symptoms Baseline: Goal status: INITIAL  2.  Pt will be educated on edema reduction techniques to use at home Baseline:  Goal status: INITIAL  3.  Pt will explore options for orthotic inserts to optimize foot mechanics whether it is OTC or custom made Baseline:  Goal status: INITIAL  4.  Pt will report reduced pain by at least 25% with daily activities. Baseline:  Goal status: INITIAL    LONG TERM GOALS: Target date: 08/28/23  Pt will be ind with advanced HEP and understand importance of continued compliance upon D/C from PT Baseline:  Goal status: INITIAL  2.  Pt will improve strength of Rt ankle to at least 4+/5 to improve dynamic support of ankle mechanics with walking Baseline:   Goal status: INITIAL  3.  Pt will improve LEFS score to at least 56/80 to demo improved function Baseline: 46/80 Goal status: INITIAL  4.  Pt will report at least 75% improvement in Rt ankle pain with daily activities and exericse. Baseline:  Goal status: INITIAL     PLAN:  PT FREQUENCY: 2x/week  PT DURATION: 8 weeks  PLANNED INTERVENTIONS: 97110-Therapeutic exercises, 97530- Therapeutic activity, [id number]- Neuromuscular re-education, 97535- Self Care, [id number]- Manual therapy, [phone]- Gait training, [phone]- Aquatic Therapy, [phone]- Electrical stimulation (unattended), [phone]- Electrical stimulation (manual), [id number]- Vasopneumatic device, [id number]- Ionotophoresis 4mg /ml Dexamethasone , [id number] (1-2 muscles), [id number] (3+ muscles)- Dry Needling, Patient/Family education, Taping, Joint mobilization, and Cryotherapy  PLAN FOR NEXT SESSION: repeat DN Rt posterior tib, gastroc, soleus, as needed, review HEP, discuss edema and pain reduction options (elevation, ice, ankle pumps, etc), progress Rt ankle strength as tol.  Pt needs low impact and avoidance of squats due to knees)   Valleri Hendricksen, PT 07/19/23 2:53 PM

## 2023-07-12 NOTE — Therapy (Signed)
 OUTPATIENT PHYSICAL THERAPY LOWER EXTREMITY TREATMENT   Patient Name: Olivia Carr MRN: [id number] DOB:April 12, 1960, 63 y.o., female Today's Date: 07/13/2023  END OF SESSION:  PT End of Session - 07/13/23 0759     Visit Number 4    Date for PT Re-Evaluation 08/28/23    Authorization Type [employer]    PT Start Time 0759    PT Stop Time 0843    PT Time Calculation (min) 44 min    Activity Tolerance Patient tolerated treatment well    Behavior During Therapy [facility] for tasks assessed/performed             Past Medical History:  Diagnosis Date   Allergic rhinitis    sees Dr. Almeda Jacobs    Asthma    sees Dr. Almeda Jacobs    Benign tumor of pineal gland [facility])     last MRI 10-10-09, stable, no follow up planned   DUB (dysfunctional uterine bleeding)    GERD (gastroesophageal reflux disease)    History of bone density study 03-22-13   normal    Hypertension    NSVD (normal spontaneous vaginal delivery)    X2   Pneumonia    Routine gynecological examination    sees Dr. Shila Door    Past Surgical History:  Procedure Laterality Date   ABDOMINAL HYSTERECTOMY     LAVH   ANTERIOR CRUCIATE LIGAMENT REPAIR     right knee with medial and lateral meniscectomies 09/04/08   COLONOSCOPY  05/06/2021   per Dr. Augustus Ledger Cherene Core), benign polyps, repeat in 5 yrs   DILATION AND CURETTAGE OF UTERUS     x 2   KNEE ARTHROSCOPY Left 2018   LIGAMENT REPAIR Left 08/24/2019   Procedure: LIGAMENT REPAIR;  Surgeon: Micheline Ahr, MD;  Location: [facility];  Service: Orthopedics;  Laterality: Left;   MYOMECTOMY ABDOMINAL APPROACH     RADIAL HEAD ARTHROPLASTY Left 08/24/2019   Procedure: RADIAL HEAD ARTHROPLASTY;  Surgeon: Micheline Ahr, MD;  Location: [facility];  Service: Orthopedics;  Laterality: Left;   TONSILLECTOMY     WISDOM TOOTH EXTRACTION     Patient Active Problem List   Diagnosis Date Noted   COVID-19 virus infection 02/06/2020   Coccydynia 03/03/2014    Anxiety 01/19/2013   Perimenopause 01/19/2012   Acute upper respiratory infection 04/06/2010   Asthma with exacerbation 04/06/2010   BENIGN NEOPLASM OF PINEAL GLAND 09/24/2009   GASTROENTERITIS 07/13/2009   SORE THROAT 07/05/2009   FEVER UNSPECIFIED 07/05/2009   ASTHMA 01/22/2008   HYPERTENSION 10/12/2006   ALLERGIC RHINITIS 10/12/2006   GERD 10/08/2006   PNEUMONIA, HX OF 10/08/2006    PCP: Corita Diego, MD  REFERRING PROVIDER: Osa Blase, MD  REFERRING DIAG: posterior tibial tendonitis   THERAPY DIAG:  Pain in right ankle and joints of right foot  Muscle weakness (generalized)  Abnormal posture  Rationale for Evaluation and Treatment: Rehabilitation  ONSET DATE: 6+ months  SUBJECTIVE:   SUBJECTIVE STATEMENT: New order for B ankle pain. Patient switched her L orthotic out and replaced with [facility] insert. Awoke with cramp in the left calf. L side was painful yesterday in distal lateral ITB. The left side is more swelling in the left foot vs. Burning.   Eval: Has been told she has some arthritis in both feet - has pain and swelling in both ankles/feet.   The right side has ongoing sharp burning medial pain and swelling.  A chronic nuisance. Just started using Rt ankle brace  which has helped and I use an arch support brace on Lt side. I am low impact only for exercise - I walk several times a week 2-3 miles and go to gym several times a week for small group training for strength/stability.  I have bad knees.  Can't do any deep squats. I'm back on Meloxicam  this week.  PERTINENT HISTORY: Using Rt ankle brace and Lt arch support brace (copper elastic) ACL repair Rt knee with medial and lateral menisectomies 2010 Left knee scope years ago with ongoing Baker's cyst  PAIN:  PAIN:  Are you having pain? Yes NPRS scale: 1-2/10 R arch; No pain left today.  Pain location: Rt  Pain orientation: Right  PAIN TYPE: burning, sharp, and tight Pain description: intermittent   Aggravating factors: unsure Relieving factors: so far the brace is helping   PRECAUTIONS: per MD low impact activiites only due to knee and ankle histories  RED FLAGS: None   WEIGHT BEARING RESTRICTIONS: No  FALLS:  Has patient fallen in last 6 months? No  LIVING ENVIRONMENT: Lives with: lives with their spouse Lives in: House/apartment Stairs: Yes: Internal: 13 steps; on right going up and External: 4 steps; on right going up Has following equipment at home: None  OCCUPATION: recently retired  PLOF: Independent  PATIENT GOALS: figure out how to eliminate inflammation and minimize pain, figure out proper activities  NEXT MD VISIT: July 2  OBJECTIVE:  Note: Objective measures were completed at Evaluation unless otherwise noted.  DIAGNOSTIC FINDINGS:  Bone density May 2025:  IMPRESSION: Normal based on BMD. Fracture risk is unknown due to history of bone building therapy.  PATIENT SURVEYS:  LEFS: 46/80  COGNITION: Overall cognitive status: Within functional limits for tasks assessed     SENSATION: Gets tingling/numbness on dorsal aspect of feet near toes  EDEMA:  Observable pitting edema in distal Rt LE when Pt took off ankle brace   MUSCLE LENGTH: Hamstrings: WNL Bil piriformis tightness  POSTURE: increased thoracic kyphosis and Rt hip externally rotated with overpronation on Rt compared to Lt which is also pronated  PALPATION: Very tender Rt posterior tib muscle belly > tendon, with trigger points Tender bil medial gastroc with TP  LOWER EXTREMITY ROM: WFL bil  LOWER EXTREMITY MMT: Rt knee 4-/5 - protective into knee extension Lt knee 4/5 Bil ankles 4-/5 inv/eversion Bil ankle DF 4+/5 Able to perform bil heel raise  GAIT: Distance walked:  Assistive device utilized: None Level of assistance: Complete Independence Comments: overpronation                                                                                                                                  TREATMENT DATE:  07/13/23: Bike L3 x5' Slant board soleus stretch bil 2x30  Bil heels raises x10, B heel raises squeezing ball between ankles, single heel raise x10 each side Red tband seated ER B ankle x10  Trigger Point Dry  Needling  Subsequent Treatment: Instructions provided previously at initial dry needling treatment.   Patient Verbal Consent Given: Yes Education Handout Provided: Previously Provided Muscles Treated: B gastroc/soleus, post tibialis, flexor digitorum Electrical Stimulation Performed: No Treatment Response/Outcome: Utilized skilled palpation to identify bony landmarks and trigger points.  Able to illicit twitch response and muscle elongation.  Soft tissue mobilization to muscles needled to further promote tissue elongation and decreased pain.        07/10/23: Bike L3 x5' Slant board gastroc stretch bil 2x30  Bil heels raises x10, single heel raise x10 each side - much more fatigue and visible atrophy of Rt gastroc Seated ball squeeze 10x5 Red tband seated IR/ER Rt ankle 2x10 Discussion about edema management - elevation above heart, ankle pumps while elevated, use of ice - 10-15 min duration for ice - recommend [employer]pac oversize Discussion of use of compression socks and how she may need to get longer socks to come up to below knee vs mid-calf  07/08/23 Bike L3 x 5 min Supine pf stretch with leg straight Supine ITB stretch with strap x 60 sec B Gastroc stretch wall 2x30 sec B Trigger Point Dry Needling  Initial Treatment: Pt instructed on Dry Needling rational, procedures, and possible side effects. Pt instructed to expect mild to moderate muscle soreness later in the day and/or into the next day.  Pt instructed in methods to reduce muscle soreness. Pt instructed to continue prescribed HEP. Patient was educated on signs and symptoms of infection and other risk factors and advised to seek medical attention should they occur.  Patient  verbalized understanding of these instructions and education.   Patient Verbal Consent Given: Yes Education Handout Provided: Yes Muscles Treated: R gastoc, soleus, post tibialis, flexor digitorum Electrical Stimulation Performed: No Treatment Response/Outcome: Utilized skilled palpation to identify bony landmarks and trigger points.  Able to illicit twitch response and muscle elongation.  Soft tissue mobilization to muscles needled to further promote tissue elongation and decreased pain.      07/03/23: Options for OTC or custom orthotics discussed - Pt may try OTC at [facility] or [facility] Pt education about DN - interested in adding this to treatment plan and understands it isn't covered by insurance Initiated HEP     PATIENT EDUCATION:  Education details: [employer] Person educated: Patient Education method: Programmer, multimedia, Demonstration, Verbal cues, and Handouts Education comprehension: verbalized understanding and returned demonstration  HOME EXERCISE PROGRAM: Access Code: [email or web address] URL: [email or web address] Date: 07/10/2023 Prepared by: Minor Amble Beuhring  Exercises - Supine Piriformis Stretch with Leg Straight  - 1 x daily - 7 x weekly - 1 sets - 2 reps - 30 hold - Supine ITB Stretch with Strap  - 1 x daily - 7 x weekly - 1 sets - 2 reps - 30 hold - Gastroc Stretch on Wall  - 1 x daily - 7 x weekly - 1 sets - 2 reps - 30 hold - Heel Toe Raises with Counter Support  - 1 x daily - 7 x weekly - 2 sets - 10 reps - Single Leg Heel Raise with Counter Support  - 1 x daily - 7 x weekly - 1-2 sets - 10 reps - Seated Ankle Eversion with Resistance  - 1 x daily - 7 x weekly - 2 sets - 10 reps - Long Sitting Ankle Inversion with Anchored Resistance  - 1 x daily - 7 x weekly - 2 sets - 10 reps - Ankle Inversion with Anchored Resistance at Table  -  1 x daily - 7 x weekly - 2 sets - 10 reps  ASSESSMENT:  CLINICAL IMPRESSION: Patient reports she continues to feel relief in the  R foot and lower leg. Some compensation noted with TB eversion, so we worked on ways to correct this. L foot/ankle was added to order. Excellent response to DN in B lower legs today. Less trigger points noted in R compared to initial session of DN as expected. Some soreness reported upon standing on left.   Eval: Patient is a 63 y.o. female who was seen today for physical therapy evaluation and treatment for Rt posterior tibial tendonitis. Pt has bil ankle arthritis and presents with Rt hip ER with signif overprontation Rt>Lt.  Her pain is described as chronic, ranging from 0-3/10, and presenting as shooting and sharp along medial aspect of Rt ankle with some tingling/numbness to dorsal aspect of foot/toes.  She has history of bil knee surgeries (Rt ACL and bil scopes).  She is active but maintains a low impact routine of walking and strength training and avoids squatting due to knee history.  Strength of bil LE range from 4-/5 to 4/5, with weakness mostly in Rt knee and ankle.  LEFS score is 46/80 demonstrating signif impact of symptoms on function.  Pt will benefit from skilled PT including DN, Pt education, inflammation reduction modalities and exercise to optimize her posture, mechanics and participation in daily activities.  OBJECTIVE IMPAIRMENTS: Abnormal gait, decreased activity tolerance, decreased knowledge of condition, decreased mobility, decreased strength, increased edema, increased muscle spasms, impaired flexibility, impaired sensation, impaired tone, improper body mechanics, postural dysfunction, and pain.   ACTIVITY LIMITATIONS: sitting, standing, squatting, stairs, transfers, and locomotion level  PARTICIPATION LIMITATIONS: cleaning, laundry, community activity, and yard work  PERSONAL FACTORS: Time since onset of injury/illness/exacerbation and 1 comorbidity: bil knee pain with surgical history are also affecting patient's functional outcome.   REHAB POTENTIAL: Excellent  CLINICAL  DECISION MAKING: Stable/uncomplicated  EVALUATION COMPLEXITY: Low   GOALS: Goals reviewed with patient? Yes  SHORT TERM GOALS: Target date: 07/31/23 Pt will be ind with initial HEP without exacerbation of symptoms Baseline: Goal status: INITIAL  2.  Pt will be educated on edema reduction techniques to use at home Baseline:  Goal status: INITIAL  3.  Pt will explore options for orthotic inserts to optimize foot mechanics whether it is OTC or custom made Baseline:  Goal status: INITIAL  4.  Pt will report reduced pain by at least 25% with daily activities. Baseline:  Goal status: INITIAL    LONG TERM GOALS: Target date: 08/28/23  Pt will be ind with advanced HEP and understand importance of continued compliance upon D/C from PT Baseline:  Goal status: INITIAL  2.  Pt will improve strength of Rt ankle to at least 4+/5 to improve dynamic support of ankle mechanics with walking Baseline:  Goal status: INITIAL  3.  Pt will improve LEFS score to at least 56/80 to demo improved function Baseline: 46/80 Goal status: INITIAL  4.  Pt will report at least 75% improvement in Rt ankle pain with daily activities and exericse. Baseline:  Goal status: INITIAL     PLAN:  PT FREQUENCY: 2x/week  PT DURATION: 8 weeks  PLANNED INTERVENTIONS: 97110-Therapeutic exercises, 97530- Therapeutic activity, [id number]- Neuromuscular re-education, 97535- Self Care, 16109- Manual therapy, [id number]- Gait training, [phone]- Aquatic Therapy, [phone]- Electrical stimulation (unattended), [phone]- Electrical stimulation (manual), [id number]- Vasopneumatic device, [id number]- Ionotophoresis 4mg /ml Dexamethasone , 29562 (1-2 muscles), 20561 (3+ muscles)- Dry Needling, Patient/Family education,  Taping, Joint mobilization, and Cryotherapy  PLAN FOR NEXT SESSION: repeat DN Rt posterior tib, gastroc, soleus, as needed, review HEP, discuss edema and pain reduction options (elevation, ice, ankle pumps, etc), progress Rt ankle strength  as tol.  Pt needs low impact and avoidance of squats due to knees)   Jinx Mourning, PT 07/13/23 9:31 AM

## 2023-07-13 ENCOUNTER — Encounter: Payer: Self-pay | Admitting: Physical Therapy

## 2023-07-13 ENCOUNTER — Ambulatory Visit: Admitting: Physical Therapy

## 2023-07-13 DIAGNOSIS — M25571 Pain in right ankle and joints of right foot: Secondary | ICD-10-CM | POA: Diagnosis not present

## 2023-07-13 DIAGNOSIS — R293 Abnormal posture: Secondary | ICD-10-CM

## 2023-07-13 DIAGNOSIS — M6281 Muscle weakness (generalized): Secondary | ICD-10-CM

## 2023-07-14 NOTE — Therapy (Signed)
 OUTPATIENT PHYSICAL THERAPY LOWER EXTREMITY TREATMENT   Patient Name: Olivia Carr MRN: [id number] DOB:May 11, 1960, 63 y.o., female Today's Date: 07/15/2023  END OF SESSION:  PT End of Session - 07/15/23 1406     Visit Number 5    Date for PT Re-Evaluation 08/28/23    PT Start Time 1402    PT Stop Time 1445    PT Time Calculation (min) 43 min    Activity Tolerance Patient tolerated treatment well    Behavior During Therapy [facility] for tasks assessed/performed              Past Medical History:  Diagnosis Date   Allergic rhinitis    sees Dr. Almeda Jacobs    Asthma    sees Dr. Almeda Jacobs    Benign tumor of pineal gland (HCC)     last MRI 10-10-09, stable, no follow up planned   DUB (dysfunctional uterine bleeding)    GERD (gastroesophageal reflux disease)    History of bone density study 03-22-13   normal    Hypertension    NSVD (normal spontaneous vaginal delivery)    X2   Pneumonia    Routine gynecological examination    sees Dr. Shila Door    Past Surgical History:  Procedure Laterality Date   ABDOMINAL HYSTERECTOMY     LAVH   ANTERIOR CRUCIATE LIGAMENT REPAIR     right knee with medial and lateral meniscectomies 09/04/08   COLONOSCOPY  05/06/2021   per Dr. Augustus Ledger Cherene Core), benign polyps, repeat in 5 yrs   DILATION AND CURETTAGE OF UTERUS     x 2   KNEE ARTHROSCOPY Left 2018   LIGAMENT REPAIR Left 08/24/2019   Procedure: LIGAMENT REPAIR;  Surgeon: Micheline Ahr, MD;  Location: [facility];  Service: Orthopedics;  Laterality: Left;   MYOMECTOMY ABDOMINAL APPROACH     RADIAL HEAD ARTHROPLASTY Left 08/24/2019   Procedure: RADIAL HEAD ARTHROPLASTY;  Surgeon: Micheline Ahr, MD;  Location: [facility];  Service: Orthopedics;  Laterality: Left;   TONSILLECTOMY     WISDOM TOOTH EXTRACTION     Patient Active Problem List   Diagnosis Date Noted   COVID-19 virus infection 02/06/2020   Coccydynia 03/03/2014   Anxiety 01/19/2013    Perimenopause 01/19/2012   Acute upper respiratory infection 04/06/2010   Asthma with exacerbation 04/06/2010   BENIGN NEOPLASM OF PINEAL GLAND 09/24/2009   GASTROENTERITIS 07/13/2009   SORE THROAT 07/05/2009   FEVER UNSPECIFIED 07/05/2009   ASTHMA 01/22/2008   HYPERTENSION 10/12/2006   ALLERGIC RHINITIS 10/12/2006   GERD 10/08/2006   PNEUMONIA, HX OF 10/08/2006    PCP: Corita Diego, MD  REFERRING PROVIDER: Osa Blase, MD  REFERRING DIAG: posterior tibial tendonitis   THERAPY DIAG:  Pain in right ankle and joints of right foot  Muscle weakness (generalized)  Abnormal posture  Rationale for Evaluation and Treatment: Rehabilitation  ONSET DATE: 6+ months  SUBJECTIVE:   SUBJECTIVE STATEMENT: Good response to DN. Able to walk 2 miles with not pain today. Had to go back to both orthotics. No knee pain,    07/13/23: New order for B ankle pain. Patient switched her L orthotic out and replaced with [facility] insert. Awoke with cramp in the left calf. L side was painful yesterday in distal lateral ITB. The left side is more swelling in the left foot vs. Burning.   Eval: Has been told she has some arthritis in both feet - has pain and swelling in both ankles/feet.  The right side has ongoing sharp burning medial pain and swelling.  A chronic nuisance. Just started using Rt ankle brace which has helped and I use an arch support brace on Lt side. I am low impact only for exercise - I walk several times a week 2-3 miles and go to gym several times a week for small group training for strength/stability.  I have bad knees.  Can't do any deep squats. I'm back on Meloxicam  this week.  PERTINENT HISTORY: Using Rt ankle brace and Lt arch support brace (copper elastic) ACL repair Rt knee with medial and lateral menisectomies 2010 Left knee scope years ago with ongoing Baker's cyst  PAIN:  PAIN:  Are you having pain? Yes NPRS scale: 0/10 R arch; No pain left today.  Pain location: Rt   Pain orientation: Right  PAIN TYPE: burning, sharp, and tight Pain description: intermittent  Aggravating factors: unsure Relieving factors: so far the brace is helping   PRECAUTIONS: per MD low impact activiites only due to knee and ankle histories  RED FLAGS: None   WEIGHT BEARING RESTRICTIONS: No  FALLS:  Has patient fallen in last 6 months? No  LIVING ENVIRONMENT: Lives with: lives with their spouse Lives in: House/apartment Stairs: Yes: Internal: 13 steps; on right going up and External: 4 steps; on right going up Has following equipment at home: None  OCCUPATION: recently retired  PLOF: Independent  PATIENT GOALS: figure out how to eliminate inflammation and minimize pain, figure out proper activities  NEXT MD VISIT: July 2  OBJECTIVE:  Note: Objective measures were completed at Evaluation unless otherwise noted.  DIAGNOSTIC FINDINGS:  Bone density May 2025:  IMPRESSION: Normal based on BMD. Fracture risk is unknown due to history of bone building therapy.  PATIENT SURVEYS:  LEFS: 46/80  COGNITION: Overall cognitive status: Within functional limits for tasks assessed     SENSATION: Gets tingling/numbness on dorsal aspect of feet near toes  EDEMA:  Observable pitting edema in distal Rt LE when Pt took off ankle brace   MUSCLE LENGTH: Hamstrings: WNL Bil piriformis tightness  POSTURE: increased thoracic kyphosis and Rt hip externally rotated with overpronation on Rt compared to Lt which is also pronated  PALPATION: Very tender Rt posterior tib muscle belly > tendon, with trigger points Tender bil medial gastroc with TP  LOWER EXTREMITY ROM: WFL bil  LOWER EXTREMITY MMT: Rt knee 4-/5 - protective into knee extension Lt knee 4/5 Bil ankles 4-/5 inv/eversion Bil ankle DF 4+/5 Able to perform bil heel raise  GAIT: Distance walked:  Assistive device utilized: None Level of assistance: Complete Independence Comments: overpronation                                                                                                                                  TREATMENT DATE:  07/15/23: Bike L3 x5' Rockerboard gastroc stretch 2x30 sec  Slant board soleus stretch bil 2x30  Standing  spit squat on toes for soleus strengthenging B heel raises squeezing ball between ankles x 10 B heel raises on stairs x 10 ea straight and toes out SLS difficult  SLS dot taps B  Bosu step ups with B pole support x 10 B Heel raise with pos tib bias/peroneal bias (done on an angle leaning against wall)    07/13/23: Bike L3 x5' Slant board soleus stretch bil 2x30  Bil heels raises x10, B heel raises squeezing ball between ankles, single heel raise x10 each side Red tband seated ER B ankle x10  Trigger Point Dry Needling  Subsequent Treatment: Instructions provided previously at initial dry needling treatment.   Patient Verbal Consent Given: Yes Education Handout Provided: Previously Provided Muscles Treated: B gastroc/soleus, post tibialis, flexor digitorum Electrical Stimulation Performed: No Treatment Response/Outcome: Utilized skilled palpation to identify bony landmarks and trigger points.  Able to illicit twitch response and muscle elongation.  Soft tissue mobilization to muscles needled to further promote tissue elongation and decreased pain.        07/10/23: Bike L3 x5' Slant board gastroc stretch bil 2x30  Bil heels raises x10, single heel raise x10 each side - much more fatigue and visible atrophy of Rt gastroc Seated ball squeeze 10x5 Red tband seated IR/ER Rt ankle 2x10 Discussion about edema management - elevation above heart, ankle pumps while elevated, use of ice - 10-15 min duration for ice - recommend Colpac oversize Discussion of use of compression socks and how she may need to get longer socks to come up to below knee vs mid-calf  07/08/23 Bike L3 x 5 min Supine pf stretch with leg straight Supine ITB stretch with  strap x 60 sec B Gastroc stretch wall 2x30 sec B Trigger Point Dry Needling  Initial Treatment: Pt instructed on Dry Needling rational, procedures, and possible side effects. Pt instructed to expect mild to moderate muscle soreness later in the day and/or into the next day.  Pt instructed in methods to reduce muscle soreness. Pt instructed to continue prescribed HEP. Patient was educated on signs and symptoms of infection and other risk factors and advised to seek medical attention should they occur.  Patient verbalized understanding of these instructions and education.   Patient Verbal Consent Given: Yes Education Handout Provided: Yes Muscles Treated: R gastoc, soleus, post tibialis, flexor digitorum Electrical Stimulation Performed: No Treatment Response/Outcome: Utilized skilled palpation to identify bony landmarks and trigger points.  Able to illicit twitch response and muscle elongation.  Soft tissue mobilization to muscles needled to further promote tissue elongation and decreased pain.      07/03/23: Options for OTC or custom orthotics discussed - Pt may try OTC at [facility] or [facility] Pt education about DN - interested in adding this to treatment plan and understands it isn't covered by insurance Initiated HEP     PATIENT EDUCATION:  Education details: [employer] Person educated: Patient Education method: Programmer, multimedia, Demonstration, Verbal cues, and Handouts Education comprehension: verbalized understanding and returned demonstration  HOME EXERCISE PROGRAM: Access Code: [email or web address] URL: [email or web address] Date: 07/15/2023 Prepared by: Concha Deed  Exercises - Supine Piriformis Stretch with Leg Straight  - 1 x daily - 7 x weekly - 1 sets - 2 reps - 30 hold - Supine ITB Stretch with Strap  - 1 x daily - 7 x weekly - 1 sets - 2 reps - 30 hold - Gastroc Stretch on Wall  - 1 x daily - 7 x weekly - 1 sets - 2 reps -  30 hold - Heel Toe Raises with Counter Support  -  1 x daily - 7 x weekly - 2 sets - 10 reps - Single Leg Heel Raise with Counter Support  - 1 x daily - 7 x weekly - 1-2 sets - 10 reps - Seated Ankle Eversion with Resistance  - 1 x daily - 7 x weekly - 2 sets - 10 reps - Long Sitting Ankle Inversion with Anchored Resistance  - 1 x daily - 7 x weekly - 2 sets - 10 reps - Ankle Inversion with Anchored Resistance at Table  - 1 x daily - 7 x weekly - 2 sets - 10 reps - Pulse Lunge  - 1 x daily - 3 x weekly - 2 sets - 10 reps - Single Leg Balance with Clock Reach  - 1 x daily - 3 x weekly - 2 sets - 10 reps  ASSESSMENT:  CLINICAL IMPRESSION: Patient with marked improvement in pain and tightness since last session. She was able to walk two miles without any pain this morning. She tolerated all exercises today and feels a lot of them in the peroneals which is likely due to her new orthotics. Discussed doing her heel raises without shoes at home to encourage more foot/ankle strengthening. Patient very challenged with balance activities today.   Eval: Patient is a 63 y.o. female who was seen today for physical therapy evaluation and treatment for Rt posterior tibial tendonitis. Pt has bil ankle arthritis and presents with Rt hip ER with signif overprontation Rt>Lt.  Her pain is described as chronic, ranging from 0-3/10, and presenting as shooting and sharp along medial aspect of Rt ankle with some tingling/numbness to dorsal aspect of foot/toes.  She has history of bil knee surgeries (Rt ACL and bil scopes).  She is active but maintains a low impact routine of walking and strength training and avoids squatting due to knee history.  Strength of bil LE range from 4-/5 to 4/5, with weakness mostly in Rt knee and ankle.  LEFS score is 46/80 demonstrating signif impact of symptoms on function.  Pt will benefit from skilled PT including DN, Pt education, inflammation reduction modalities and exercise to optimize her posture, mechanics and participation in daily  activities.  OBJECTIVE IMPAIRMENTS: Abnormal gait, decreased activity tolerance, decreased knowledge of condition, decreased mobility, decreased strength, increased edema, increased muscle spasms, impaired flexibility, impaired sensation, impaired tone, improper body mechanics, postural dysfunction, and pain.   ACTIVITY LIMITATIONS: sitting, standing, squatting, stairs, transfers, and locomotion level  PARTICIPATION LIMITATIONS: cleaning, laundry, community activity, and yard work  PERSONAL FACTORS: Time since onset of injury/illness/exacerbation and 1 comorbidity: bil knee pain with surgical history are also affecting patient's functional outcome.   REHAB POTENTIAL: Excellent  CLINICAL DECISION MAKING: Stable/uncomplicated  EVALUATION COMPLEXITY: Low   GOALS: Goals reviewed with patient? Yes  SHORT TERM GOALS: Target date: 07/31/23 Pt will be ind with initial HEP without exacerbation of symptoms Baseline: Goal status: INITIAL  2.  Pt will be educated on edema reduction techniques to use at home Baseline:  Goal status: INITIAL  3.  Pt will explore options for orthotic inserts to optimize foot mechanics whether it is OTC or custom made Baseline:  Goal status: INITIAL  4.  Pt will report reduced pain by at least 25% with daily activities. Baseline:  Goal status: INITIAL    LONG TERM GOALS: Target date: 08/28/23  Pt will be ind with advanced HEP and understand importance of continued compliance upon  D/C from PT Baseline:  Goal status: INITIAL  2.  Pt will improve strength of Rt ankle to at least 4+/5 to improve dynamic support of ankle mechanics with walking Baseline:  Goal status: INITIAL  3.  Pt will improve LEFS score to at least 56/80 to demo improved function Baseline: 46/80 Goal status: INITIAL  4.  Pt will report at least 75% improvement in Rt ankle pain with daily activities and exericse. Baseline:  Goal status: INITIAL     PLAN:  PT FREQUENCY:  2x/week  PT DURATION: 8 weeks  PLANNED INTERVENTIONS: 97110-Therapeutic exercises, 97530- Therapeutic activity, [id number]- Neuromuscular re-education, 97535- Self Care, [id number]- Manual therapy, [phone]- Gait training, [phone]- Aquatic Therapy, [phone]- Electrical stimulation (unattended), [phone]- Electrical stimulation (manual), [id number]- Vasopneumatic device, [id number]- Ionotophoresis 4mg /ml Dexamethasone , 44034 (1-2 muscles), [id number] (3+ muscles)- Dry Needling, Patient/Family education, Taping, Joint mobilization, and Cryotherapy  PLAN FOR NEXT SESSION: repeat DN Rt posterior tib, gastroc, soleus, as needed, review HEP, discuss edema and pain reduction options (elevation, ice, ankle pumps, etc), progress Rt ankle strength as tol.  Pt needs low impact and avoidance of squats due to knees)   Jinx Mourning, PT 07/15/23 5:33 PM

## 2023-07-15 ENCOUNTER — Encounter: Payer: Self-pay | Admitting: Physical Therapy

## 2023-07-15 ENCOUNTER — Ambulatory Visit: Admitting: Physical Therapy

## 2023-07-15 DIAGNOSIS — M6281 Muscle weakness (generalized): Secondary | ICD-10-CM

## 2023-07-15 DIAGNOSIS — M25571 Pain in right ankle and joints of right foot: Secondary | ICD-10-CM | POA: Diagnosis not present

## 2023-07-15 DIAGNOSIS — R293 Abnormal posture: Secondary | ICD-10-CM

## 2023-07-20 ENCOUNTER — Ambulatory Visit: Admitting: Physical Therapy

## 2023-07-20 ENCOUNTER — Encounter: Payer: Self-pay | Admitting: Physical Therapy

## 2023-07-20 DIAGNOSIS — M25571 Pain in right ankle and joints of right foot: Secondary | ICD-10-CM | POA: Diagnosis not present

## 2023-07-20 DIAGNOSIS — R293 Abnormal posture: Secondary | ICD-10-CM

## 2023-07-20 DIAGNOSIS — M6281 Muscle weakness (generalized): Secondary | ICD-10-CM

## 2023-07-20 NOTE — Therapy (Signed)
 OUTPATIENT PHYSICAL THERAPY LOWER EXTREMITY TREATMENT   Patient Name: Olivia Carr MRN: [id number] DOB:[birth year], 63 y.o., female Today's Date: 07/20/2023  END OF SESSION:  PT End of Session - 07/20/23 0808     Visit Number 6    Date for PT Re-Evaluation 08/28/23    Authorization Type [employer]    PT Start Time 0805    PT Stop Time 0847    PT Time Calculation (min) 42 min    Activity Tolerance Patient tolerated treatment well    Behavior During Therapy [facility] for tasks assessed/performed               Past Medical History:  Diagnosis Date   Allergic rhinitis    sees Dr. Frutoso    Asthma    sees Dr. Frutoso    Benign tumor of pineal gland [facility])     last MRI 10-10-09, stable, no follow up planned   DUB (dysfunctional uterine bleeding)    GERD (gastroesophageal reflux disease)    History of bone density study 03-22-13   normal    Hypertension    NSVD (normal spontaneous vaginal delivery)    X2   Pneumonia    Routine gynecological examination    sees Dr. Curlee Guan    Past Surgical History:  Procedure Laterality Date   ABDOMINAL HYSTERECTOMY     LAVH   ANTERIOR CRUCIATE LIGAMENT REPAIR     right knee with medial and lateral meniscectomies 09/04/08   COLONOSCOPY  05/06/2021   per Dr. Julianne Gwen), benign polyps, repeat in 5 yrs   DILATION AND CURETTAGE OF UTERUS     x 2   KNEE ARTHROSCOPY Left 2018   LIGAMENT REPAIR Left 08/24/2019   Procedure: LIGAMENT REPAIR;  Surgeon: Cristy Bonner DASEN, MD;  Location: [facility];  Service: Orthopedics;  Laterality: Left;   MYOMECTOMY ABDOMINAL APPROACH     RADIAL HEAD ARTHROPLASTY Left 08/24/2019   Procedure: RADIAL HEAD ARTHROPLASTY;  Surgeon: Cristy Bonner DASEN, MD;  Location: [facility];  Service: Orthopedics;  Laterality: Left;   TONSILLECTOMY     WISDOM TOOTH EXTRACTION     Patient Active Problem List   Diagnosis Date Noted   COVID-19 virus infection 02/06/2020   Coccydynia  03/03/2014   Anxiety 01/19/2013   Perimenopause 01/19/2012   Acute upper respiratory infection 04/06/2010   Asthma with exacerbation 04/06/2010   BENIGN NEOPLASM OF PINEAL GLAND 09/24/2009   GASTROENTERITIS 07/13/2009   SORE THROAT 07/05/2009   FEVER UNSPECIFIED 07/05/2009   ASTHMA 01/22/2008   HYPERTENSION 10/12/2006   ALLERGIC RHINITIS 10/12/2006   GERD 10/08/2006   PNEUMONIA, HX OF 10/08/2006    PCP: Garnette Olmsted, MD  REFERRING PROVIDER: Fonda Olmsted, MD  REFERRING DIAG: posterior tibial tendonitis   THERAPY DIAG:  Pain in right ankle and joints of right foot  Muscle weakness (generalized)  Abnormal posture  Rationale for Evaluation and Treatment: Rehabilitation  ONSET DATE: 6+ months  SUBJECTIVE:   SUBJECTIVE STATEMENT: I just got back from a trip to [place].  I am 70% better.  No more shooting pains unless I am doing a lot of varied activity like on this trip.     Eval: Has been told she has some arthritis in both feet - has pain and swelling in both ankles/feet.   The right side has ongoing sharp burning medial pain and swelling.  A chronic nuisance. Just started using Rt ankle brace which has helped and I use an arch support  brace on Lt side. I am low impact only for exercise - I walk several times a week 2-3 miles and go to gym several times a week for small group training for strength/stability.  I have bad knees.  Can't do any deep squats. I'm back on Meloxicam  this week.  PERTINENT HISTORY: Using Rt ankle brace and Lt arch support brace (copper elastic) ACL repair Rt knee with medial and lateral menisectomies 2010 Left knee scope years ago with ongoing Baker's cyst  PAIN:  PAIN:  Are you having pain? Yes NPRS scale: 0/10 R arch; No pain left today.  Pain location: Rt  Pain orientation: Right  PAIN TYPE: burning, sharp, and tight Pain description: intermittent  Aggravating factors: unsure Relieving factors: so far the brace is helping   PRECAUTIONS:  per MD low impact activiites only due to knee and ankle histories  RED FLAGS: None   WEIGHT BEARING RESTRICTIONS: No  FALLS:  Has patient fallen in last 6 months? No  LIVING ENVIRONMENT: Lives with: lives with their spouse Lives in: House/apartment Stairs: Yes: Internal: 13 steps; on right going up and External: 4 steps; on right going up Has following equipment at home: None  OCCUPATION: recently retired  PLOF: Independent  PATIENT GOALS: figure out how to eliminate inflammation and minimize pain, figure out proper activities  NEXT MD VISIT: July 2  OBJECTIVE:  Note: Objective measures were completed at Evaluation unless otherwise noted.  DIAGNOSTIC FINDINGS:  Bone density May 2025:  IMPRESSION: Normal based on BMD. Fracture risk is unknown due to history of bone building therapy.  PATIENT SURVEYS:  LEFS: 46/80  COGNITION: Overall cognitive status: Within functional limits for tasks assessed     SENSATION: Gets tingling/numbness on dorsal aspect of feet near toes  EDEMA:  Observable pitting edema in distal Rt LE when Pt took off ankle brace   MUSCLE LENGTH: Hamstrings: WNL Bil piriformis tightness  POSTURE: increased thoracic kyphosis and Rt hip externally rotated with overpronation on Rt compared to Lt which is also pronated  PALPATION: Very tender Rt posterior tib muscle belly > tendon, with trigger points Tender bil medial gastroc with TP  LOWER EXTREMITY ROM: WFL bil  LOWER EXTREMITY MMT: Rt knee 4-/5 - protective into knee extension Lt knee 4/5 Bil ankles 4-/5 inv/eversion Bil ankle DF 4+/5 Able to perform bil heel raise  GAIT: Distance walked:  Assistive device utilized: None Level of assistance: Complete Independence Comments: overpronation                                                                                                                                 TREATMENT DATE:  07/20/23: Seated talocrural DF mobs with movement  using knee swing into flexion Navicular glides Gr II/III Trigger Point Dry Needling Subsequent Treatment: Instructions provided previously at initial dry needling treatment.  Patient Verbal Consent Given: Yes Education Handout Provided: Previously Provided Muscles Treated: B gastroc/soleus, post tibialis, flexor  digitorum Electrical Stimulation Performed: No Treatment Response/Outcome: Utilized skilled palpation to identify bony landmarks and trigger points.  Able to illicit twitch response and muscle elongation.  Soft tissue mobilization to muscles needled to further promote tissue elongation and decreased pain.    STM bil calves s/p DN Slant board stretch 2x30 each for gastroc and soleus bias bil  07/15/23: Bike L3 x5' Rockerboard gastroc stretch 2x30 sec  Slant board soleus stretch bil 2x30  Standing spit squat on toes for soleus strengthenging B heel raises squeezing ball between ankles x 10 B heel raises on stairs x 10 ea straight and toes out SLS difficult  SLS dot taps B  Bosu step ups with B pole support x 10 B Heel raise with pos tib bias/peroneal bias (done on an angle leaning against wall)  07/13/23: Bike L3 x5' Slant board soleus stretch bil 2x30  Bil heels raises x10, B heel raises squeezing ball between ankles, single heel raise x10 each side Red tband seated ER B ankle x10  Trigger Point Dry Needling Subsequent Treatment: Instructions provided previously at initial dry needling treatment.  Patient Verbal Consent Given: Yes Education Handout Provided: Previously Provided Muscles Treated: B gastroc/soleus, post tibialis, flexor digitorum Electrical Stimulation Performed: No Treatment Response/Outcome: Utilized skilled palpation to identify bony landmarks and trigger points.  Able to illicit twitch response and muscle elongation.  Soft tissue mobilization to muscles needled to further promote tissue elongation and decreased pain.      PATIENT EDUCATION:  Education  details: [email or web address] Person educated: Patient Education method: Programmer, multimedia, Demonstration, Verbal cues, and Handouts Education comprehension: verbalized understanding and returned demonstration  HOME EXERCISE PROGRAM: Access Code: [email or web address] URL: [email or web address] Date: 07/15/2023 Prepared by: Mliss  Exercises - Supine Piriformis Stretch with Leg Straight  - 1 x daily - 7 x weekly - 1 sets - 2 reps - 30 hold - Supine ITB Stretch with Strap  - 1 x daily - 7 x weekly - 1 sets - 2 reps - 30 hold - Gastroc Stretch on Wall  - 1 x daily - 7 x weekly - 1 sets - 2 reps - 30 hold - Heel Toe Raises with Counter Support  - 1 x daily - 7 x weekly - 2 sets - 10 reps - Single Leg Heel Raise with Counter Support  - 1 x daily - 7 x weekly - 1-2 sets - 10 reps - Seated Ankle Eversion with Resistance  - 1 x daily - 7 x weekly - 2 sets - 10 reps - Long Sitting Ankle Inversion with Anchored Resistance  - 1 x daily - 7 x weekly - 2 sets - 10 reps - Ankle Inversion with Anchored Resistance at Table  - 1 x daily - 7 x weekly - 2 sets - 10 reps - Pulse Lunge  - 1 x daily - 3 x weekly - 2 sets - 10 reps - Single Leg Balance with Clock Reach  - 1 x daily - 3 x weekly - 2 sets - 10 reps  ASSESSMENT:  CLINICAL IMPRESSION: Patient reports 70% improvement.  She continues to get bil LE swelling but compression socks help - she got a size up so they are not creating a cut off point mid-calf.  Swelling is better in AM and worse with activity, as expected in gravity.  She continues to have good carry over from DN on Rt LE and is showing improvement with addition of Lt LE as well.  Caution needed with therex to  protect bil knees.  Next visit will explore progression of HEP.  Eval: Patient is a 63 y.o. female who was seen today for physical therapy evaluation and treatment for Rt posterior tibial tendonitis. Pt has bil ankle arthritis and presents with Rt hip ER with signif overprontation Rt>Lt.  Her pain is  described as chronic, ranging from 0-3/10, and presenting as shooting and sharp along medial aspect of Rt ankle with some tingling/numbness to dorsal aspect of foot/toes.  She has history of bil knee surgeries (Rt ACL and bil scopes).  She is active but maintains a low impact routine of walking and strength training and avoids squatting due to knee history.  Strength of bil LE range from 4-/5 to 4/5, with weakness mostly in Rt knee and ankle.  LEFS score is 46/80 demonstrating signif impact of symptoms on function.  Pt will benefit from skilled PT including DN, Pt education, inflammation reduction modalities and exercise to optimize her posture, mechanics and participation in daily activities.  OBJECTIVE IMPAIRMENTS: Abnormal gait, decreased activity tolerance, decreased knowledge of condition, decreased mobility, decreased strength, increased edema, increased muscle spasms, impaired flexibility, impaired sensation, impaired tone, improper body mechanics, postural dysfunction, and pain.   ACTIVITY LIMITATIONS: sitting, standing, squatting, stairs, transfers, and locomotion level  PARTICIPATION LIMITATIONS: cleaning, laundry, community activity, and yard work  PERSONAL FACTORS: Time since onset of injury/illness/exacerbation and 1 comorbidity: bil knee pain with surgical history are also affecting patient's functional outcome.   REHAB POTENTIAL: Excellent  CLINICAL DECISION MAKING: Stable/uncomplicated  EVALUATION COMPLEXITY: Low   GOALS: Goals reviewed with patient? Yes  SHORT TERM GOALS: Target date: 07/31/23 Pt will be ind with initial HEP without exacerbation of symptoms Baseline: Goal status: MET 6/23  2.  Pt will be educated on edema reduction techniques to use at home Baseline:  Goal status: MET 6/23  3.  Pt will explore options for orthotic inserts to optimize foot mechanics whether it is OTC or custom made Baseline:  Goal status: MET 6/23 - GOT OTC ORTHOTICS   4.  Pt will  report reduced pain by at least 25% with daily activities. Baseline:  Goal status: INITIAL    LONG TERM GOALS: Target date: 08/28/23  Pt will be ind with advanced HEP and understand importance of continued compliance upon D/C from PT Baseline:  Goal status: ONGOING 6/23  2.  Pt will improve strength of Rt ankle to at least 4+/5 to improve dynamic support of ankle mechanics with walking Baseline:  Goal status: INITIAL  3.  Pt will improve LEFS score to at least 56/80 to demo improved function Baseline: 46/80 Goal status: INITIAL  4.  Pt will report at least 75% improvement in Rt ankle pain with daily activities and exericse. Baseline:  Goal status: INITIAL     PLAN:  PT FREQUENCY: 2x/week  PT DURATION: 8 weeks  PLANNED INTERVENTIONS: 97110-Therapeutic exercises, 97530- Therapeutic activity, [id number]- Neuromuscular re-education, 97535- Self Care, [id number]- Manual therapy, [id number]- Gait training, [phone]- Aquatic Therapy, [phone]- Electrical stimulation (unattended), [phone]- Electrical stimulation (manual), [id number]- Vasopneumatic device, [id number]- Ionotophoresis 4mg /ml Dexamethasone , [id number] (1-2 muscles), [id number] (3+ muscles)- Dry Needling, Patient/Family education, Taping, Joint mobilization, and Cryotherapy  PLAN FOR NEXT SESSION: HEP progression with caution for bil knee pain, work on balance, progress Rt ankle strength as tol.  Pt needs low impact and avoidance of squats due to knees)   Jamarii Banks, PT 07/20/23 9:07 AM

## 2023-07-22 ENCOUNTER — Ambulatory Visit: Admitting: Physical Therapy

## 2023-07-22 ENCOUNTER — Encounter: Payer: Self-pay | Admitting: Physical Therapy

## 2023-07-22 DIAGNOSIS — M25571 Pain in right ankle and joints of right foot: Secondary | ICD-10-CM | POA: Diagnosis not present

## 2023-07-22 DIAGNOSIS — R293 Abnormal posture: Secondary | ICD-10-CM

## 2023-07-22 DIAGNOSIS — M6281 Muscle weakness (generalized): Secondary | ICD-10-CM

## 2023-07-22 NOTE — Therapy (Signed)
 OUTPATIENT PHYSICAL THERAPY LOWER EXTREMITY TREATMENT   Patient Name: Olivia Carr MRN: [id number] DOB:July 07, 1960, 63 y.o., female Today's Date: 07/22/2023  END OF SESSION:  PT End of Session - 07/22/23 1534     Visit Number 7    Date for PT Re-Evaluation 08/28/23    Authorization Type [employer]    PT Start Time 1532    PT Stop Time 1620    PT Time Calculation (min) 48 min    Activity Tolerance Patient tolerated treatment well    Behavior During Therapy WFL for tasks assessed/performed                Past Medical History:  Diagnosis Date   Allergic rhinitis    sees Dr. Frutoso    Asthma    sees Dr. Frutoso    Benign tumor of pineal gland (HCC)     last MRI 10-10-09, stable, no follow up planned   DUB (dysfunctional uterine bleeding)    GERD (gastroesophageal reflux disease)    History of bone density study 03-22-13   normal    Hypertension    NSVD (normal spontaneous vaginal delivery)    X2   Pneumonia    Routine gynecological examination    sees Dr. Curlee Guan    Past Surgical History:  Procedure Laterality Date   ABDOMINAL HYSTERECTOMY     LAVH   ANTERIOR CRUCIATE LIGAMENT REPAIR     right knee with medial and lateral meniscectomies 09/04/08   COLONOSCOPY  05/06/2021   per Dr. Julianne Gwen), benign polyps, repeat in 5 yrs   DILATION AND CURETTAGE OF UTERUS     x 2   KNEE ARTHROSCOPY Left 2018   LIGAMENT REPAIR Left 08/24/2019   Procedure: LIGAMENT REPAIR;  Surgeon: Cristy Bonner DASEN, MD;  Location: [facility];  Service: Orthopedics;  Laterality: Left;   MYOMECTOMY ABDOMINAL APPROACH     RADIAL HEAD ARTHROPLASTY Left 08/24/2019   Procedure: RADIAL HEAD ARTHROPLASTY;  Surgeon: Cristy Bonner DASEN, MD;  Location: [facility];  Service: Orthopedics;  Laterality: Left;   TONSILLECTOMY     WISDOM TOOTH EXTRACTION     Patient Active Problem List   Diagnosis Date Noted   COVID-19 virus infection 02/06/2020   Coccydynia  03/03/2014   Anxiety 01/19/2013   Perimenopause 01/19/2012   Acute upper respiratory infection 04/06/2010   Asthma with exacerbation 04/06/2010   BENIGN NEOPLASM OF PINEAL GLAND 09/24/2009   GASTROENTERITIS 07/13/2009   SORE THROAT 07/05/2009   FEVER UNSPECIFIED 07/05/2009   ASTHMA 01/22/2008   HYPERTENSION 10/12/2006   ALLERGIC RHINITIS 10/12/2006   GERD 10/08/2006   PNEUMONIA, HX OF 10/08/2006    PCP: Garnette Olmsted, MD  REFERRING PROVIDER: Fonda Olmsted, MD  REFERRING DIAG: posterior tibial tendonitis   THERAPY DIAG:  Pain in right ankle and joints of right foot  Muscle weakness (generalized)  Abnormal posture  Rationale for Evaluation and Treatment: Rehabilitation  ONSET DATE: 6+ months  SUBJECTIVE:   SUBJECTIVE STATEMENT: I was only sore for a few hours and then it was so much better.  The Meloxicam  really helps my inflammation.  My Lt knee is cranky sometimes.   Eval: Has been told she has some arthritis in both feet - has pain and swelling in both ankles/feet.   The right side has ongoing sharp burning medial pain and swelling.  A chronic nuisance. Just started using Rt ankle brace which has helped and I use an arch support brace on Lt side.  I am low impact only for exercise - I walk several times a week 2-3 miles and go to gym several times a week for small group training for strength/stability.  I have bad knees.  Can't do any deep squats. I'm back on Meloxicam  this week.  PERTINENT HISTORY: Using Rt ankle brace and Lt arch support brace (copper elastic) ACL repair Rt knee with medial and lateral menisectomies 2010 Left knee scope years ago with ongoing Baker's cyst  PAIN:  PAIN:  Are you having pain? Yes NPRS scale: 0/10 No pain left today.  Pain location: Rt  Pain orientation: Right  PAIN TYPE: burning, sharp, and tight Pain description: intermittent  Aggravating factors: unsure Relieving factors: so far the brace is helping   PRECAUTIONS: per MD  low impact activiites only due to knee and ankle histories  RED FLAGS: None   WEIGHT BEARING RESTRICTIONS: No  FALLS:  Has patient fallen in last 6 months? No  LIVING ENVIRONMENT: Lives with: lives with their spouse Lives in: House/apartment Stairs: Yes: Internal: 13 steps; on right going up and External: 4 steps; on right going up Has following equipment at home: None  OCCUPATION: recently retired  PLOF: Independent  PATIENT GOALS: figure out how to eliminate inflammation and minimize pain, figure out proper activities  NEXT MD VISIT: July 2  OBJECTIVE:  Note: Objective measures were completed at Evaluation unless otherwise noted.  DIAGNOSTIC FINDINGS:  Bone density May 2025:  IMPRESSION: Normal based on BMD. Fracture risk is unknown due to history of bone building therapy.  PATIENT SURVEYS:  LEFS: 46/80  COGNITION: Overall cognitive status: Within functional limits for tasks assessed     SENSATION: Gets tingling/numbness on dorsal aspect of feet near toes  EDEMA:  Observable pitting edema in distal Rt LE when Pt took off ankle brace   MUSCLE LENGTH: Hamstrings: WNL Bil piriformis tightness  POSTURE: increased thoracic kyphosis and Rt hip externally rotated with overpronation on Rt compared to Lt which is also pronated  PALPATION: Very tender Rt posterior tib muscle belly > tendon, with trigger points Tender bil medial gastroc with TP  LOWER EXTREMITY ROM: WFL bil  LOWER EXTREMITY MMT: Rt knee 4-/5 - protective into knee extension Lt knee 4/5 Bil ankles 4-/5 inv/eversion Bil ankle DF 4+/5 Able to perform bil heel raise  GAIT: Distance walked:  Assistive device utilized: None Level of assistance: Complete Independence Comments: overpronation                                                                                                                                 TREATMENT DATE:  07/22/23: L3 recumbent bike x5'  Ankle rockerboard x1'  ant/post Dynamic knee flexion with HS stretch x8 rounds each LE - foot on 2nd step Bil heel raise holding ball b/w heels x10 Single leg heel raise with anterior UE support x10 each LE Single leg heel raise with lateral UE support - leaning  into hand on wall to bias toward invertors of ankle x10 each Squat with heel raise reps x10 - hold back of chair in squat for balance support - knees don't tolerate static squat or lunge position Clock drill SLS no UE support - alternate LE each round to avoid prolonged SLS for knees Standing slow-motion march on firm surface no UE support, then march on foam with bil ski poles Standing at base of steps - march taps to 1st, 2nd, 3rd steps alt LE Standing at base of steps - triple tap to first step then switch LE Updated HEP  07/20/23: Seated talocrural DF mobs with movement using knee swing into flexion Navicular glides Gr II/III Trigger Point Dry Needling Subsequent Treatment: Instructions provided previously at initial dry needling treatment.  Patient Verbal Consent Given: Yes Education Handout Provided: Previously Provided Muscles Treated: B gastroc/soleus, post tibialis, flexor digitorum Electrical Stimulation Performed: No Treatment Response/Outcome: Utilized skilled palpation to identify bony landmarks and trigger points.  Able to illicit twitch response and muscle elongation.  Soft tissue mobilization to muscles needled to further promote tissue elongation and decreased pain.    STM bil calves s/p DN Slant board stretch 2x30 each for gastroc and soleus bias bil  07/15/23: Bike L3 x5' Rockerboard gastroc stretch 2x30 sec  Slant board soleus stretch bil 2x30  Standing spit squat on toes for soleus strengthenging B heel raises squeezing ball between ankles x 10 B heel raises on stairs x 10 ea straight and toes out SLS difficult  SLS dot taps B  Bosu step ups with B pole support x 10 B Heel raise with pos tib bias/peroneal bias (done on an  angle leaning against wall)  PATIENT EDUCATION:  Education details: [email or web address] Person educated: Patient Education method: Programmer, multimedia, Demonstration, Verbal cues, and Handouts Education comprehension: verbalized understanding and returned demonstration  HOME EXERCISE PROGRAM: Access Code: [email or web address] URL: [email or web address] Date: 07/15/2023 Prepared by: Mliss  Exercises - Supine Piriformis Stretch with Leg Straight  - 1 x daily - 7 x weekly - 1 sets - 2 reps - 30 hold - Supine ITB Stretch with Strap  - 1 x daily - 7 x weekly - 1 sets - 2 reps - 30 hold - Gastroc Stretch on Wall  - 1 x daily - 7 x weekly - 1 sets - 2 reps - 30 hold - Heel Toe Raises with Counter Support  - 1 x daily - 7 x weekly - 2 sets - 10 reps - Single Leg Heel Raise with Counter Support  - 1 x daily - 7 x weekly - 1-2 sets - 10 reps - Seated Ankle Eversion with Resistance  - 1 x daily - 7 x weekly - 2 sets - 10 reps - Long Sitting Ankle Inversion with Anchored Resistance  - 1 x daily - 7 x weekly - 2 sets - 10 reps - Ankle Inversion with Anchored Resistance at Table  - 1 x daily - 7 x weekly - 2 sets - 10 reps - Pulse Lunge  - 1 x daily - 3 x weekly - 2 sets - 10 reps - Single Leg Balance with Clock Reach  - 1 x daily - 3 x weekly - 2 sets - 10 reps  ASSESSMENT:  CLINICAL IMPRESSION: Patient reports 70% improvement in bil ankle and foot pain.  She was sore for a few hours after DN last session but the soreness lifted a few hours later and has remained released.  HEP was progressed today  to include biased single leg heel raise toward inversion strength with plantar flexion as well as balance work that avoided knee pain given knee history.  Pt would like to continue with DN as needed next week and feels she benefits from the marinating technique for some of the trigger points to have more time to release.  Eval: Patient is a 63 y.o. female who was seen today for physical therapy evaluation and treatment  for Rt posterior tibial tendonitis. Pt has bil ankle arthritis and presents with Rt hip ER with signif overprontation Rt>Lt.  Her pain is described as chronic, ranging from 0-3/10, and presenting as shooting and sharp along medial aspect of Rt ankle with some tingling/numbness to dorsal aspect of foot/toes.  She has history of bil knee surgeries (Rt ACL and bil scopes).  She is active but maintains a low impact routine of walking and strength training and avoids squatting due to knee history.  Strength of bil LE range from 4-/5 to 4/5, with weakness mostly in Rt knee and ankle.  LEFS score is 46/80 demonstrating signif impact of symptoms on function.  Pt will benefit from skilled PT including DN, Pt education, inflammation reduction modalities and exercise to optimize her posture, mechanics and participation in daily activities.  OBJECTIVE IMPAIRMENTS: Abnormal gait, decreased activity tolerance, decreased knowledge of condition, decreased mobility, decreased strength, increased edema, increased muscle spasms, impaired flexibility, impaired sensation, impaired tone, improper body mechanics, postural dysfunction, and pain.   ACTIVITY LIMITATIONS: sitting, standing, squatting, stairs, transfers, and locomotion level  PARTICIPATION LIMITATIONS: cleaning, laundry, community activity, and yard work  PERSONAL FACTORS: Time since onset of injury/illness/exacerbation and 1 comorbidity: bil knee pain with surgical history are also affecting patient's functional outcome.   REHAB POTENTIAL: Excellent  CLINICAL DECISION MAKING: Stable/uncomplicated  EVALUATION COMPLEXITY: Low   GOALS: Goals reviewed with patient? Yes  SHORT TERM GOALS: Target date: 07/31/23 Pt will be ind with initial HEP without exacerbation of symptoms Baseline: Goal status: MET 6/23  2.  Pt will be educated on edema reduction techniques to use at home Baseline:  Goal status: MET 6/23  3.  Pt will explore options for orthotic  inserts to optimize foot mechanics whether it is OTC or custom made Baseline:  Goal status: MET 6/23 - GOT OTC ORTHOTICS   4.  Pt will report reduced pain by at least 25% with daily activities. Baseline:  Goal status: MET 6/25    LONG TERM GOALS: Target date: 08/28/23  Pt will be ind with advanced HEP and understand importance of continued compliance upon D/C from PT Baseline:  Goal status: ONGOING 6/23  2.  Pt will improve strength of Rt ankle to at least 4+/5 to improve dynamic support of ankle mechanics with walking Baseline:  Goal status: INITIAL  3.  Pt will improve LEFS score to at least 56/80 to demo improved function Baseline: 46/80 Goal status: INITIAL  4.  Pt will report at least 75% improvement in Rt ankle pain with daily activities and exericse. Baseline:  Goal status: ONGOING, 70%     PLAN:  PT FREQUENCY: 2x/week  PT DURATION: 8 weeks  PLANNED INTERVENTIONS: 97110-Therapeutic exercises, 97530- Therapeutic activity, [id number]- Neuromuscular re-education, 97535- Self Care, [id number]- Manual therapy, [id number]- Gait training, [phone]- Aquatic Therapy, [phone]- Electrical stimulation (unattended), [phone]- Electrical stimulation (manual), [id number]- Vasopneumatic device, [id number]- Ionotophoresis 4mg /ml Dexamethasone , [id number] (1-2 muscles), [id number] (3+ muscles)- Dry Needling, Patient/Family education, Taping, Joint mobilization, and Cryotherapy  PLAN FOR NEXT SESSION: review HEP progression, repeat DN  to bil calves and use marinating technique in soleus and posterior tib, use caution with therex for bil knee pain, work on balance, progress Rt ankle strength as tol.    Makenzee Choudhry, PT 07/22/23 4:29 PM

## 2023-07-26 NOTE — Therapy (Signed)
 OUTPATIENT PHYSICAL THERAPY LOWER EXTREMITY TREATMENT   Patient Name: Olivia Carr MRN: [id number] DOB:05/24/1960, 63 y.o., female Today's Date: 07/27/2023  END OF SESSION:  PT End of Session - 07/27/23 0847     Visit Number 8    Date for PT Re-Evaluation 08/28/23    Authorization Type [employer]    PT Start Time [phone]    PT Stop Time 0930    PT Time Calculation (min) 43 min    Activity Tolerance Patient tolerated treatment well    Behavior During Therapy [facility] for tasks assessed/performed                 Past Medical History:  Diagnosis Date   Allergic rhinitis    sees Dr. Frutoso    Asthma    sees Dr. Frutoso    Benign tumor of pineal gland (HCC)     last MRI 10-10-09, stable, no follow up planned   DUB (dysfunctional uterine bleeding)    GERD (gastroesophageal reflux disease)    History of bone density study 03-22-13   normal    Hypertension    NSVD (normal spontaneous vaginal delivery)    X2   Pneumonia    Routine gynecological examination    sees Dr. Curlee Guan    Past Surgical History:  Procedure Laterality Date   ABDOMINAL HYSTERECTOMY     LAVH   ANTERIOR CRUCIATE LIGAMENT REPAIR     right knee with medial and lateral meniscectomies 09/04/08   COLONOSCOPY  05/06/2021   per Dr. Julianne Gwen), benign polyps, repeat in 5 yrs   DILATION AND CURETTAGE OF UTERUS     x 2   KNEE ARTHROSCOPY Left 2018   LIGAMENT REPAIR Left 08/24/2019   Procedure: LIGAMENT REPAIR;  Surgeon: Cristy Bonner DASEN, MD;  Location: [facility];  Service: Orthopedics;  Laterality: Left;   MYOMECTOMY ABDOMINAL APPROACH     RADIAL HEAD ARTHROPLASTY Left 08/24/2019   Procedure: RADIAL HEAD ARTHROPLASTY;  Surgeon: Cristy Bonner DASEN, MD;  Location: [facility];  Service: Orthopedics;  Laterality: Left;   TONSILLECTOMY     WISDOM TOOTH EXTRACTION     Patient Active Problem List   Diagnosis Date Noted   COVID-19 virus infection 02/06/2020   Coccydynia  03/03/2014   Anxiety 01/19/2013   Perimenopause 01/19/2012   Acute upper respiratory infection 04/06/2010   Asthma with exacerbation 04/06/2010   BENIGN NEOPLASM OF PINEAL GLAND 09/24/2009   GASTROENTERITIS 07/13/2009   SORE THROAT 07/05/2009   FEVER UNSPECIFIED 07/05/2009   ASTHMA 01/22/2008   HYPERTENSION 10/12/2006   ALLERGIC RHINITIS 10/12/2006   GERD 10/08/2006   PNEUMONIA, HX OF 10/08/2006    PCP: Garnette Olmsted, MD  REFERRING PROVIDER: Fonda Olmsted, MD  REFERRING DIAG: posterior tibial tendonitis   THERAPY DIAG:  Pain in right ankle and joints of right foot  Muscle weakness (generalized)  Abnormal posture  Rationale for Evaluation and Treatment: Rehabilitation  ONSET DATE: 6+ months  SUBJECTIVE:   SUBJECTIVE STATEMENT: Didn't do much because my mom is in the hospital. Swelling has been much better.    Eval: Has been told she has some arthritis in both feet - has pain and swelling in both ankles/feet.   The right side has ongoing sharp burning medial pain and swelling.  A chronic nuisance. Just started using Rt ankle brace which has helped and I use an arch support brace on Lt side. I am low impact only for exercise - I walk several times  a week 2-3 miles and go to gym several times a week for small group training for strength/stability.  I have bad knees.  Can't do any deep squats. I'm back on Meloxicam  this week.  PERTINENT HISTORY: Using Rt ankle brace and Lt arch support brace (copper elastic) ACL repair Rt knee with medial and lateral menisectomies 2010 Left knee scope years ago with ongoing Baker's cyst  PAIN:  PAIN:  Are you having pain? Yes NPRS scale: 0/10 No pain left today.  Pain location: Rt  Pain orientation: Right  PAIN TYPE: burning, sharp, and tight Pain description: intermittent  Aggravating factors: unsure Relieving factors: so far the brace is helping   PRECAUTIONS: per MD low impact activiites only due to knee and ankle  histories  RED FLAGS: None   WEIGHT BEARING RESTRICTIONS: No  FALLS:  Has patient fallen in last 6 months? No  LIVING ENVIRONMENT: Lives with: lives with their spouse Lives in: House/apartment Stairs: Yes: Internal: 13 steps; on right going up and External: 4 steps; on right going up Has following equipment at home: None  OCCUPATION: recently retired  PLOF: Independent  PATIENT GOALS: figure out how to eliminate inflammation and minimize pain, figure out proper activities  NEXT MD VISIT: July 2  OBJECTIVE:  Note: Objective measures were completed at Evaluation unless otherwise noted.  DIAGNOSTIC FINDINGS:  Bone density May 2025:  IMPRESSION: Normal based on BMD. Fracture risk is unknown due to history of bone building therapy.  PATIENT SURVEYS:  LEFS: 46/80  COGNITION: Overall cognitive status: Within functional limits for tasks assessed     SENSATION: Gets tingling/numbness on dorsal aspect of feet near toes  EDEMA:  Observable pitting edema in distal Rt LE when Pt took off ankle brace   MUSCLE LENGTH: Hamstrings: WNL Bil piriformis tightness  POSTURE: increased thoracic kyphosis and Rt hip externally rotated with overpronation on Rt compared to Lt which is also pronated  PALPATION: Very tender Rt posterior tib muscle belly > tendon, with trigger points Tender bil medial gastroc with TP  LOWER EXTREMITY ROM: WFL bil  LOWER EXTREMITY MMT: Rt knee 4-/5 - protective into knee extension Lt knee 4/5 Bil ankles 4-/5 inv/eversion Bil ankle DF 4+/5 Able to perform bil heel raise  GAIT: Distance walked:  Assistive device utilized: None Level of assistance: Complete Independence Comments: overpronation                                                                                                                                 TREATMENT DATE:  6/30/025  Bike L4 x 5 min Bil heel raise holding ball b/w heels x10 Single leg heel raise with anterior UE  support x10 each LE Single leg heel raise with lateral UE support - leaning into hand on wall to bias toward invertors of ankle x10 each Squat with heel raise reps x10 - hold back of chair in squat for balance support  Trigger  Point Dry Needling  Subsequent Treatment: Instructions provided previously at initial dry needling treatment.   Patient Verbal Consent Given: Yes Education Handout Provided: Previously Provided Muscles Treated: B gastroc/soleus, post tibialis, flexor digitorum Electrical Stimulation Performed: No Treatment Response/Outcome: Utilized skilled palpation to identify bony landmarks and trigger points.  Able to illicit twitch response and muscle elongation.  Soft tissue mobilization to muscles needled to further promote tissue elongation and decreased pain.        07/22/23: L3 recumbent bike x5'  Ankle rockerboard x1' ant/post Dynamic knee flexion with HS stretch x8 rounds each LE - foot on 2nd step Bil heel raise holding ball b/w heels x10 Single leg heel raise with anterior UE support x10 each LE Single leg heel raise with lateral UE support - leaning into hand on wall to bias toward invertors of ankle x10 each Squat with heel raise reps x10 - hold back of chair in squat for balance support - knees don't tolerate static squat or lunge position Clock drill SLS no UE support - alternate LE each round to avoid prolonged SLS for knees Standing slow-motion march on firm surface no UE support, then march on foam with bil ski poles Standing at base of steps - march taps to 1st, 2nd, 3rd steps alt LE Standing at base of steps - triple tap to first step then switch LE Updated HEP  07/20/23: Seated talocrural DF mobs with movement using knee swing into flexion Navicular glides Gr II/III Trigger Point Dry Needling Subsequent Treatment: Instructions provided previously at initial dry needling treatment.  Patient Verbal Consent Given: Yes Education Handout Provided:  Previously Provided Muscles Treated: B gastroc/soleus, post tibialis, flexor digitorum Electrical Stimulation Performed: No Treatment Response/Outcome: Utilized skilled palpation to identify bony landmarks and trigger points.  Able to illicit twitch response and muscle elongation.  Soft tissue mobilization to muscles needled to further promote tissue elongation and decreased pain.    STM bil calves s/p DN Slant board stretch 2x30 each for gastroc and soleus bias bil  07/15/23: Bike L3 x5' Rockerboard gastroc stretch 2x30 sec  Slant board soleus stretch bil 2x30  Standing spit squat on toes for soleus strengthenging B heel raises squeezing ball between ankles x 10 B heel raises on stairs x 10 ea straight and toes out SLS difficult  SLS dot taps B  Bosu step ups with B pole support x 10 B Heel raise with pos tib bias/peroneal bias (done on an angle leaning against wall)  PATIENT EDUCATION:  Education details: [email or web address] Person educated: Patient Education method: Programmer, multimedia, Demonstration, Verbal cues, and Handouts Education comprehension: verbalized understanding and returned demonstration  HOME EXERCISE PROGRAM: Access Code: [email or web address] URL: [email or web address] Date: 07/15/2023 Prepared by: Mliss  Exercises - Supine Piriformis Stretch with Leg Straight  - 1 x daily - 7 x weekly - 1 sets - 2 reps - 30 hold - Supine ITB Stretch with Strap  - 1 x daily - 7 x weekly - 1 sets - 2 reps - 30 hold - Gastroc Stretch on Wall  - 1 x daily - 7 x weekly - 1 sets - 2 reps - 30 hold - Heel Toe Raises with Counter Support  - 1 x daily - 7 x weekly - 2 sets - 10 reps - Single Leg Heel Raise with Counter Support  - 1 x daily - 7 x weekly - 1-2 sets - 10 reps - Seated Ankle Eversion with Resistance  - 1 x daily - 7 x weekly -  2 sets - 10 reps - Long Sitting Ankle Inversion with Anchored Resistance  - 1 x daily - 7 x weekly - 2 sets - 10 reps - Ankle Inversion with Anchored Resistance  at Table  - 1 x daily - 7 x weekly - 2 sets - 10 reps - Pulse Lunge  - 1 x daily - 3 x weekly - 2 sets - 10 reps - Single Leg Balance with Clock Reach  - 1 x daily - 3 x weekly - 2 sets - 10 reps  ASSESSMENT:  CLINICAL IMPRESSION: Patient reporting significant improvement overall. She was unable to do her exercises this weekend due to a family emergency. Excellent response to DN in all muscles needled today. Overall patient continues to improve with flexibility and pain control. She is challenged with single leg heel raises. She continues to demonstrate potential for improvement and would benefit from continued skilled therapy to address impairments.    Eval: Patient is a 63 y.o. female who was seen today for physical therapy evaluation and treatment for Rt posterior tibial tendonitis. Pt has bil ankle arthritis and presents with Rt hip ER with signif overprontation Rt>Lt.  Her pain is described as chronic, ranging from 0-3/10, and presenting as shooting and sharp along medial aspect of Rt ankle with some tingling/numbness to dorsal aspect of foot/toes.  She has history of bil knee surgeries (Rt ACL and bil scopes).  She is active but maintains a low impact routine of walking and strength training and avoids squatting due to knee history.  Strength of bil LE range from 4-/5 to 4/5, with weakness mostly in Rt knee and ankle.  LEFS score is 46/80 demonstrating signif impact of symptoms on function.  Pt will benefit from skilled PT including DN, Pt education, inflammation reduction modalities and exercise to optimize her posture, mechanics and participation in daily activities.  OBJECTIVE IMPAIRMENTS: Abnormal gait, decreased activity tolerance, decreased knowledge of condition, decreased mobility, decreased strength, increased edema, increased muscle spasms, impaired flexibility, impaired sensation, impaired tone, improper body mechanics, postural dysfunction, and pain.   ACTIVITY LIMITATIONS: sitting,  standing, squatting, stairs, transfers, and locomotion level  PARTICIPATION LIMITATIONS: cleaning, laundry, community activity, and yard work  PERSONAL FACTORS: Time since onset of injury/illness/exacerbation and 1 comorbidity: bil knee pain with surgical history are also affecting patient's functional outcome.   REHAB POTENTIAL: Excellent  CLINICAL DECISION MAKING: Stable/uncomplicated  EVALUATION COMPLEXITY: Low   GOALS: Goals reviewed with patient? Yes  SHORT TERM GOALS: Target date: 07/31/23 Pt will be ind with initial HEP without exacerbation of symptoms Baseline: Goal status: MET 6/23  2.  Pt will be educated on edema reduction techniques to use at home Baseline:  Goal status: MET 6/23  3.  Pt will explore options for orthotic inserts to optimize foot mechanics whether it is OTC or custom made Baseline:  Goal status: MET 6/23 - GOT OTC ORTHOTICS   4.  Pt will report reduced pain by at least 25% with daily activities. Baseline:  Goal status: MET 6/25    LONG TERM GOALS: Target date: 08/28/23  Pt will be ind with advanced HEP and understand importance of continued compliance upon D/C from PT Baseline:  Goal status: ONGOING 6/23  2.  Pt will improve strength of Rt ankle to at least 4+/5 to improve dynamic support of ankle mechanics with walking Baseline:  Goal status: INITIAL  3.  Pt will improve LEFS score to at least 56/80 to demo improved function Baseline: 46/80 Goal  status: INITIAL  4.  Pt will report at least 75% improvement in Rt ankle pain with daily activities and exericse. Baseline:  Goal status: ONGOING, 70%     PLAN:  PT FREQUENCY: 2x/week  PT DURATION: 8 weeks  PLANNED INTERVENTIONS: 97110-Therapeutic exercises, 97530- Therapeutic activity, [id number]- Neuromuscular re-education, 97535- Self Care, [id number]- Manual therapy, [phone]- Gait training, [phone]- Aquatic Therapy, [phone]- Electrical stimulation (unattended), [phone]- Electrical stimulation (manual),  [id number]- Vasopneumatic device, [id number]- Ionotophoresis 4mg /ml Dexamethasone , [id number] (1-2 muscles), [id number] (3+ muscles)- Dry Needling, Patient/Family education, Taping, Joint mobilization, and Cryotherapy  PLAN FOR NEXT SESSION: review HEP progression, repeat DN to bil calves and use marinating technique in soleus and posterior tib, use caution with therex for bil knee pain, work on balance, progress Rt ankle strength as tol.    Mliss Cummins, PT  07/27/23 5:31 PM

## 2023-07-27 ENCOUNTER — Encounter: Payer: Self-pay | Admitting: Physical Therapy

## 2023-07-27 ENCOUNTER — Ambulatory Visit: Admitting: Physical Therapy

## 2023-07-27 DIAGNOSIS — M6281 Muscle weakness (generalized): Secondary | ICD-10-CM

## 2023-07-27 DIAGNOSIS — M25571 Pain in right ankle and joints of right foot: Secondary | ICD-10-CM

## 2023-07-27 DIAGNOSIS — R293 Abnormal posture: Secondary | ICD-10-CM

## 2023-07-29 ENCOUNTER — Ambulatory Visit: Attending: Orthopedic Surgery | Admitting: Physical Therapy

## 2023-07-29 ENCOUNTER — Encounter: Payer: Self-pay | Admitting: Physical Therapy

## 2023-07-29 DIAGNOSIS — M25571 Pain in right ankle and joints of right foot: Secondary | ICD-10-CM | POA: Diagnosis present

## 2023-07-29 DIAGNOSIS — M6281 Muscle weakness (generalized): Secondary | ICD-10-CM | POA: Diagnosis present

## 2023-07-29 DIAGNOSIS — R293 Abnormal posture: Secondary | ICD-10-CM | POA: Diagnosis present

## 2023-07-29 NOTE — Therapy (Signed)
 OUTPATIENT PHYSICAL THERAPY LOWER EXTREMITY TREATMENT   Patient Name: Olivia Carr MRN: [id number] DOB:05/25/1960, 63 y.o., female Today's Date: 07/29/2023  END OF SESSION:  PT End of Session - 07/29/23 1618     Visit Number 9    Date for PT Re-Evaluation 08/28/23    Authorization Type [employer]    PT Start Time 1619    PT Stop Time 1700    PT Time Calculation (min) 41 min    Activity Tolerance Patient tolerated treatment well    Behavior During Therapy WFL for tasks assessed/performed                  Past Medical History:  Diagnosis Date   Allergic rhinitis    sees Dr. Frutoso    Asthma    sees Dr. Frutoso    Benign tumor of pineal gland (HCC)     last MRI 10-10-09, stable, no follow up planned   DUB (dysfunctional uterine bleeding)    GERD (gastroesophageal reflux disease)    History of bone density study 03-22-13   normal    Hypertension    NSVD (normal spontaneous vaginal delivery)    X2   Pneumonia    Routine gynecological examination    sees Dr. Curlee Guan    Past Surgical History:  Procedure Laterality Date   ABDOMINAL HYSTERECTOMY     LAVH   ANTERIOR CRUCIATE LIGAMENT REPAIR     right knee with medial and lateral meniscectomies 09/04/08   COLONOSCOPY  05/06/2021   per Dr. Julianne Gwen), benign polyps, repeat in 5 yrs   DILATION AND CURETTAGE OF UTERUS     x 2   KNEE ARTHROSCOPY Left 2018   LIGAMENT REPAIR Left 08/24/2019   Procedure: LIGAMENT REPAIR;  Surgeon: Cristy Bonner DASEN, MD;  Location: [facility];  Service: Orthopedics;  Laterality: Left;   MYOMECTOMY ABDOMINAL APPROACH     RADIAL HEAD ARTHROPLASTY Left 08/24/2019   Procedure: RADIAL HEAD ARTHROPLASTY;  Surgeon: Cristy Bonner DASEN, MD;  Location: [facility];  Service: Orthopedics;  Laterality: Left;   TONSILLECTOMY     WISDOM TOOTH EXTRACTION     Patient Active Problem List   Diagnosis Date Noted   COVID-19 virus infection 02/06/2020   Coccydynia  03/03/2014   Anxiety 01/19/2013   Perimenopause 01/19/2012   Acute upper respiratory infection 04/06/2010   Asthma with exacerbation 04/06/2010   BENIGN NEOPLASM OF PINEAL GLAND 09/24/2009   GASTROENTERITIS 07/13/2009   SORE THROAT 07/05/2009   FEVER UNSPECIFIED 07/05/2009   ASTHMA 01/22/2008   HYPERTENSION 10/12/2006   ALLERGIC RHINITIS 10/12/2006   GERD 10/08/2006   PNEUMONIA, HX OF 10/08/2006    PCP: Garnette Olmsted, MD  REFERRING PROVIDER: Fonda Olmsted, MD  REFERRING DIAG: posterior tibial tendonitis   THERAPY DIAG:  Pain in right ankle and joints of right foot  Muscle weakness (generalized)  Abnormal posture  Rationale for Evaluation and Treatment: Rehabilitation  ONSET DATE: 6+ months  SUBJECTIVE:   SUBJECTIVE STATEMENT:  I had a Lt knee cortisone injection today.    Eval: Has been told she has some arthritis in both feet - has pain and swelling in both ankles/feet.   The right side has ongoing sharp burning medial pain and swelling.  A chronic nuisance. Just started using Rt ankle brace which has helped and I use an arch support brace on Lt side. I am low impact only for exercise - I walk several times a week 2-3 miles and  go to gym several times a week for small group training for strength/stability.  I have bad knees.  Can't do any deep squats. I'm back on Meloxicam  this week.  PERTINENT HISTORY: Using Rt ankle brace and Lt arch support brace (copper elastic) ACL repair Rt knee with medial and lateral menisectomies 2010 Left knee scope years ago with ongoing Baker's cyst  PAIN:  PAIN:  Are you having pain? Yes NPRS scale: 0/10 No pain left today.  Pain location: Rt  Pain orientation: Right  PAIN TYPE: burning, sharp, and tight Pain description: intermittent  Aggravating factors: unsure Relieving factors: so far the brace is helping   PRECAUTIONS: per MD low impact activiites only due to knee and ankle histories  RED FLAGS: None   WEIGHT BEARING  RESTRICTIONS: No  FALLS:  Has patient fallen in last 6 months? No  LIVING ENVIRONMENT: Lives with: lives with their spouse Lives in: House/apartment Stairs: Yes: Internal: 13 steps; on right going up and External: 4 steps; on right going up Has following equipment at home: None  OCCUPATION: recently retired  PLOF: Independent  PATIENT GOALS: figure out how to eliminate inflammation and minimize pain, figure out proper activities  NEXT MD VISIT: July 2  OBJECTIVE:  Note: Objective measures were completed at Evaluation unless otherwise noted.  DIAGNOSTIC FINDINGS:  Bone density May 2025:  IMPRESSION: Normal based on BMD. Fracture risk is unknown due to history of bone building therapy.  PATIENT SURVEYS:  LEFS: 46/80  COGNITION: Overall cognitive status: Within functional limits for tasks assessed     SENSATION: Gets tingling/numbness on dorsal aspect of feet near toes  EDEMA:  Observable pitting edema in distal Rt LE when Pt took off ankle brace   MUSCLE LENGTH: Hamstrings: WNL Bil piriformis tightness  POSTURE: increased thoracic kyphosis and Rt hip externally rotated with overpronation on Rt compared to Lt which is also pronated  PALPATION: Very tender Rt posterior tib muscle belly > tendon, with trigger points Tender bil medial gastroc with TP  LOWER EXTREMITY ROM: WFL bil  LOWER EXTREMITY MMT: Rt knee 4-/5 - protective into knee extension Lt knee 4/5 Bil ankles 4-/5 inv/eversion Bil ankle DF 4+/5 Able to perform bil heel raise  GAIT: Distance walked:  Assistive device utilized: None Level of assistance: Complete Independence Comments: overpronation                                                                                                                                 TREATMENT DATE:  07/29/23 NuStep L1 x3' (low load and time due to had Lt knee injection today) Seated heel/toe raises x1' Seated toe yoga, seated shortfoot, seated towel  crunch Supine STM bil soleus, posterior tib, gastroc  6/30/025  Bike L4 x 5 min Bil heel raise holding ball b/w heels x10 Single leg heel raise with anterior UE support x10 each LE Single leg heel raise with lateral UE support - leaning  into hand on wall to bias toward invertors of ankle x10 each Squat with heel raise reps x10 - hold back of chair in squat for balance support  Trigger Point Dry Needling  Subsequent Treatment: Instructions provided previously at initial dry needling treatment.   Patient Verbal Consent Given: Yes Education Handout Provided: Previously Provided Muscles Treated: B gastroc/soleus, post tibialis, flexor digitorum Electrical Stimulation Performed: No Treatment Response/Outcome: Utilized skilled palpation to identify bony landmarks and trigger points.  Able to illicit twitch response and muscle elongation.  Soft tissue mobilization to muscles needled to further promote tissue elongation and decreased pain.      07/22/23: L3 recumbent bike x5'  Ankle rockerboard x1' ant/post Dynamic knee flexion with HS stretch x8 rounds each LE - foot on 2nd step Bil heel raise holding ball b/w heels x10 Single leg heel raise with anterior UE support x10 each LE Single leg heel raise with lateral UE support - leaning into hand on wall to bias toward invertors of ankle x10 each Squat with heel raise reps x10 - hold back of chair in squat for balance support - knees don't tolerate static squat or lunge position Clock drill SLS no UE support - alternate LE each round to avoid prolonged SLS for knees Standing slow-motion march on firm surface no UE support, then march on foam with bil ski poles Standing at base of steps - march taps to 1st, 2nd, 3rd steps alt LE Standing at base of steps - triple tap to first step then switch LE Updated HEP  PATIENT EDUCATION:  Education details: [employer] Person educated: Patient Education method: Programmer, multimedia, Demonstration, Verbal cues, and  Handouts Education comprehension: verbalized understanding and returned demonstration  HOME EXERCISE PROGRAM: Access Code: [email or web address] URL: [email or web address] Date: 07/15/2023 Prepared by: Mliss  Exercises - Supine Piriformis Stretch with Leg Straight  - 1 x daily - 7 x weekly - 1 sets - 2 reps - 30 hold - Supine ITB Stretch with Strap  - 1 x daily - 7 x weekly - 1 sets - 2 reps - 30 hold - Gastroc Stretch on Wall  - 1 x daily - 7 x weekly - 1 sets - 2 reps - 30 hold - Heel Toe Raises with Counter Support  - 1 x daily - 7 x weekly - 2 sets - 10 reps - Single Leg Heel Raise with Counter Support  - 1 x daily - 7 x weekly - 1-2 sets - 10 reps - Seated Ankle Eversion with Resistance  - 1 x daily - 7 x weekly - 2 sets - 10 reps - Long Sitting Ankle Inversion with Anchored Resistance  - 1 x daily - 7 x weekly - 2 sets - 10 reps - Ankle Inversion with Anchored Resistance at Table  - 1 x daily - 7 x weekly - 2 sets - 10 reps - Pulse Lunge  - 1 x daily - 3 x weekly - 2 sets - 10 reps - Single Leg Balance with Clock Reach  - 1 x daily - 3 x weekly - 2 sets - 10 reps  ASSESSMENT:  CLINICAL IMPRESSION: Patient reporting significant improvement overall. She had a Lt knee injection today so we focused on seated ankle mobility and toe yoga which is very challenging for Pt to coordinate.  Palpation revealed some very localized areas of tightness that remain in calves so STM performed today to these areas.  We discussed the idea of being ready to re-evaluate and perhaps taper frequency  after next week if Pt continues to do well.  Eval: Patient is a 63 y.o. female who was seen today for physical therapy evaluation and treatment for Rt posterior tibial tendonitis. Pt has bil ankle arthritis and presents with Rt hip ER with signif overprontation Rt>Lt.  Her pain is described as chronic, ranging from 0-3/10, and presenting as shooting and sharp along medial aspect of Rt ankle with some  tingling/numbness to dorsal aspect of foot/toes.  She has history of bil knee surgeries (Rt ACL and bil scopes).  She is active but maintains a low impact routine of walking and strength training and avoids squatting due to knee history.  Strength of bil LE range from 4-/5 to 4/5, with weakness mostly in Rt knee and ankle.  LEFS score is 46/80 demonstrating signif impact of symptoms on function.  Pt will benefit from skilled PT including DN, Pt education, inflammation reduction modalities and exercise to optimize her posture, mechanics and participation in daily activities.  OBJECTIVE IMPAIRMENTS: Abnormal gait, decreased activity tolerance, decreased knowledge of condition, decreased mobility, decreased strength, increased edema, increased muscle spasms, impaired flexibility, impaired sensation, impaired tone, improper body mechanics, postural dysfunction, and pain.   ACTIVITY LIMITATIONS: sitting, standing, squatting, stairs, transfers, and locomotion level  PARTICIPATION LIMITATIONS: cleaning, laundry, community activity, and yard work  PERSONAL FACTORS: Time since onset of injury/illness/exacerbation and 1 comorbidity: bil knee pain with surgical history are also affecting patient's functional outcome.   REHAB POTENTIAL: Excellent  CLINICAL DECISION MAKING: Stable/uncomplicated  EVALUATION COMPLEXITY: Low   GOALS: Goals reviewed with patient? Yes  SHORT TERM GOALS: Target date: 07/31/23 Pt will be ind with initial HEP without exacerbation of symptoms Baseline: Goal status: MET 6/23  2.  Pt will be educated on edema reduction techniques to use at home Baseline:  Goal status: MET 6/23  3.  Pt will explore options for orthotic inserts to optimize foot mechanics whether it is OTC or custom made Baseline:  Goal status: MET 6/23 - GOT OTC ORTHOTICS   4.  Pt will report reduced pain by at least 25% with daily activities. Baseline:  Goal status: MET 6/25    LONG TERM GOALS: Target  date: 08/28/23  Pt will be ind with advanced HEP and understand importance of continued compliance upon D/C from PT Baseline:  Goal status: ONGOING 6/23  2.  Pt will improve strength of Rt ankle to at least 4+/5 to improve dynamic support of ankle mechanics with walking Baseline:  Goal status: INITIAL  3.  Pt will improve LEFS score to at least 56/80 to demo improved function Baseline: 46/80 Goal status: INITIAL  4.  Pt will report at least 75% improvement in Rt ankle pain with daily activities and exericse. Baseline:  Goal status: ONGOING, 70%     PLAN:  PT FREQUENCY: 2x/week  PT DURATION: 8 weeks  PLANNED INTERVENTIONS: 97110-Therapeutic exercises, 97530- Therapeutic activity, [id number]- Neuromuscular re-education, [phone]- Self Care, 02859- Manual therapy, [phone]- Gait training, [phone]- Aquatic Therapy, [phone]- Electrical stimulation (unattended), [phone]- Electrical stimulation (manual), [id number]- Vasopneumatic device, [id number]- Ionotophoresis 4mg /ml Dexamethasone , 79439 (1-2 muscles), [id number] (3+ muscles)- Dry Needling, Patient/Family education, Taping, Joint mobilization, and Cryotherapy  PLAN FOR NEXT SESSION: discuss tapering plan of care, review HEP progression, repeat DN to bil calves and use marinating technique in soleus and posterior tib, use caution with therex for bil knee pain, work on balance, progress Rt ankle strength as tol.    Torrey Horseman, PT 07/29/23 5:05 PM

## 2023-07-30 ENCOUNTER — Encounter (INDEPENDENT_AMBULATORY_CARE_PROVIDER_SITE_OTHER): Payer: Self-pay | Admitting: Physician Assistant

## 2023-07-30 ENCOUNTER — Ambulatory Visit (INDEPENDENT_AMBULATORY_CARE_PROVIDER_SITE_OTHER): Admitting: Physician Assistant

## 2023-07-30 VITALS — BP 153/83 | HR 80

## 2023-07-30 DIAGNOSIS — H6993 Unspecified Eustachian tube disorder, bilateral: Secondary | ICD-10-CM

## 2023-07-30 DIAGNOSIS — H6990 Unspecified Eustachian tube disorder, unspecified ear: Secondary | ICD-10-CM | POA: Diagnosis not present

## 2023-07-30 NOTE — Progress Notes (Signed)
 Dear Dr. Johnny, Here is my assessment for our mutual patient, Olivia Carr. Thank you for allowing me the opportunity to care for your patient. Please do not hesitate to contact me should you have any other questions. Sincerely, Chyrl Cohen PA-C  Otolaryngology Clinic Note Referring provider: Dr. Johnny HPI:  Olivia Carr is a 63 y.o. female kindly referred by Dr. Johnny   The patient is a 63 year old female seen in our office for follow-up evaluation eustachian tube dysfunction.  She was most recently seen in the office on 06/19/2023.  Below is a recap of the encounter.  The patient is a 63 year old female presenting today with complaints of ear discomfort.  The patient notes that in January approximately 4 months ago she had a sinus infection, she notes that later on in the month she had a second upper respiratory infection and was diagnosed with pneumonia.  She was placed on antibiotics and a nebulizer.  She notes she did well until March when traveling on airplane, she felt her ears popping and felt as if she had fluid in both ears.  When she returned home she had a flare of seasonal allergies and felt that her left ear was becoming blocked again and felt another URI coming on.  She went to urgent care, she was placed on prednisone , given a shot of Depo-Medrol , she was noted to have fluid on both ears with redness.  She was placed on antibiotics, she notes her right ear was worse to the left.  She notes that symptoms seem to improve, in April she felt the clicking and popping in the ears again, she was taking meloxicam  at that time for a another issue and noted that the symptoms did seem to improve with meloxicam .  She notes overall today she is doing well, she has no significant pain, no significant hearing loss.  She feels that her ears are close to their baseline but still noticed some fullness right greater than left.  Associated ringing in the ears, no dizziness.  She notes a significant past  medical history of seasonal allergies, she notes she has been using Flonase  twice a day for several weeks now along with Singulair  and Zyrtec.  She has a significant allergic history and is receiving allergy shots weekly.  As a kid she does note that she had repeat ear infections, she denies any repeat ear infections adult.  No head or neck surgeries.  She recently retired.    Right tympanometry type B on 05/19/2023   Update 06/19/2023   Since her last office visit she notes she has been doing well.  She has been traveling and flew to [place].  She notes she did have some pressure on the plane but notes her ears equalize fairly well.  She used Afrin before the flight and has continued to use Flonase  1 time daily.  She notes that her allergies are well-controlled with the Flonase .  She denies any pain in the ears, she notes that her right ear does feel much better but does note some persistent fullness.  She denies any significant hearing loss.  No recent infections.  Update 07/30/2023.  Since her last office visit she notes she was doing well, she was flying on an airplane which is usually when her symptoms started, she was into send and felt both of your ears become congested, pressure sensation and decreased hearing, this is previous to her past episodes.  She notes prior to this she was using the daily antihistamine,  nasal saline irrigation and Flonase  twice daily.  She also took Sudafed when she was on the plane.  After getting home she notes the symptoms have persisted, she notes the right is worse than the left.  She denies any infectious symptoms, no fever, no severe pain.  She does note some clear postnasal drainage.     Independent Review of Additional Tests or Records:  none   PMH/Meds/All/SocHx/FamHx/ROS:   Past Medical History:  Diagnosis Date   Allergic rhinitis    sees Dr. Frutoso    Asthma    sees Dr. Frutoso    Benign tumor of pineal gland [facility])     last MRI 10-10-09, stable, no  follow up planned   DUB (dysfunctional uterine bleeding)    GERD (gastroesophageal reflux disease)    History of bone density study 03-22-13   normal    Hypertension    NSVD (normal spontaneous vaginal delivery)    X2   Pneumonia    Routine gynecological examination    sees Dr. Curlee Guan      Past Surgical History:  Procedure Laterality Date   ABDOMINAL HYSTERECTOMY     LAVH   ANTERIOR CRUCIATE LIGAMENT REPAIR     right knee with medial and lateral meniscectomies 09/04/08   COLONOSCOPY  05/06/2021   per Dr. Julianne Gwen), benign polyps, repeat in 5 yrs   DILATION AND CURETTAGE OF UTERUS     x 2   KNEE ARTHROSCOPY Left 2018   LIGAMENT REPAIR Left 08/24/2019   Procedure: LIGAMENT REPAIR;  Surgeon: Cristy Bonner DASEN, MD;  Location: [facility];  Service: Orthopedics;  Laterality: Left;   MYOMECTOMY ABDOMINAL APPROACH     RADIAL HEAD ARTHROPLASTY Left 08/24/2019   Procedure: RADIAL HEAD ARTHROPLASTY;  Surgeon: Cristy Bonner DASEN, MD;  Location: [facility];  Service: Orthopedics;  Laterality: Left;   TONSILLECTOMY     WISDOM TOOTH EXTRACTION      Family History  Problem Relation Age of Onset   Hypertension Mother    Pancreatic cancer Father 58   Heart disease Maternal Grandmother    Colon cancer Maternal Grandfather    Heart disease Paternal Grandmother    Heart disease Paternal Grandfather    Immunocompromised Brother    Other Other        bowel disease   Diabetes Other    Hypertension Other    Coronary artery disease Other    Breast cancer Neg Hx    BRCA 1/2 Neg Hx      Social Connections: Socially Integrated (02/26/2023)   Social Connection and Isolation Panel    Frequency of Communication with Friends and Family: More than three times a week    Frequency of Social Gatherings with Friends and Family: Twice a week    Attends Religious Services: More than 4 times per year    Active Member of [employer] or Organizations: Yes    Attends Museum/gallery exhibitions officer: More than 4 times per year    Marital Status: Living with partner      Current Outpatient Medications:    albuterol  (VENTOLIN  HFA) 108 (90 Base) MCG/ACT inhaler, Inhale 1-2 puffs into the lungs every 6 (six) hours as needed for wheezing or shortness of breath., Disp: , Rfl:    Calcium Carbonate-Vit D-Min (CALCIUM 1200 PO), Take 1 tablet by mouth every evening. , Disp: , Rfl:    cetirizine (ZYRTEC) 10 MG tablet, Take 10 mg by mouth daily., Disp: , Rfl:  EPINEPHrine  0.3 mg/0.3 mL IJ SOAJ injection, SMARTSIG:1 Auto-Injector Injection PRN, Disp: , Rfl:    esomeprazole  (NEXIUM ) 40 MG capsule, Take 1 capsule (40 mg total) by mouth daily., Disp: 90 capsule, Rfl: 3   fluticasone  (FLONASE ) 50 MCG/ACT nasal spray, USE 2 SPRAYS IN EACH NOSTRIL DAILY, Disp: 48 g, Rfl: 3   metoprolol  succinate (TOPROL -XL) 25 MG 24 hr tablet, Take 1 tablet (25 mg total) by mouth daily., Disp: 90 tablet, Rfl: 3   montelukast  (SINGULAIR ) 10 MG tablet, Take 10 mg by mouth at bedtime., Disp: , Rfl:    potassium chloride  (KLOR-CON  M) 10 MEQ tablet, Take 1 tablet (10 mEq total) by mouth daily., Disp: 90 tablet, Rfl: 3   Physical Exam:   BP (!) 153/83   Pulse 80   SpO2 95%   Pertinent Findings  CN II-XII intact Bilateral EAC clear and TM intact with bilateral mucoid effusions Anterior rhinoscopy: Septum midline; bilateral inferior turbinates with moderate hypertrophy  No lesions of oral cavity/oropharynx No obviously palpable neck masses/lymphadenopathy/thyromegaly No respiratory distress or stridor  Seprately Identifiable Procedures:  None  Impression & Plans:  Olivia Carr is a 63 y.o. female with the following   Eustachian tube dysfunction-  This is a 63 year old female seen in our office for follow-up evaluation of eustachian tube dysfunction.  She continues to have problems with ear congestion and fluid collection.  I originally saw her in January, since that time symptoms have been  on and off.  She does have a audiogram in April showing a right sided type B tympanometry.  No nasal endoscopy was performed, she had repeat audiogram which showed bilateral type A tympanometry.  On physical exam today she has bilateral mucoid effusions.  She notes that she travels approximately once per month and this is significantly bothersome to her.  She has been on Flonase , daily antihistamines, nasal saline irrigations with no significant improvement in prevention in her symptoms.  She would like to discuss further options including bilateral tympanostomy tubes.  She will follow-up with Dr. Tobie in the office for discussion and further management.  I advised her to reach out to me if she develops any new or worsening signs or symptoms in the meantime.  I did also like an updated audiogram prior to evaluation with Dr. Tobie.   - f/u with Dr. Tobie    Thank you for allowing me the opportunity to care for your patient. Please do not hesitate to contact me should you have any other questions.  Sincerely, Chyrl Cohen PA-C [facility] Phone: [phone] Fax: [phone]  07/30/2023, 2:35 PM

## 2023-08-03 ENCOUNTER — Ambulatory Visit: Admitting: Physical Therapy

## 2023-08-03 ENCOUNTER — Encounter: Payer: Self-pay | Admitting: Physical Therapy

## 2023-08-03 DIAGNOSIS — M6281 Muscle weakness (generalized): Secondary | ICD-10-CM

## 2023-08-03 DIAGNOSIS — R293 Abnormal posture: Secondary | ICD-10-CM

## 2023-08-03 DIAGNOSIS — M25571 Pain in right ankle and joints of right foot: Secondary | ICD-10-CM

## 2023-08-03 NOTE — Therapy (Signed)
 OUTPATIENT PHYSICAL THERAPY LOWER EXTREMITY TREATMENT   Patient Name: Olivia Carr MRN: [id number] DOB:[birth year], 63 y.o., female Today's Date: 08/03/2023  END OF SESSION:  PT End of Session - 08/03/23 0931     Visit Number 10    Date for PT Re-Evaluation 08/28/23    Authorization Type [employer]    PT Start Time 0929    PT Stop Time 1015    PT Time Calculation (min) 46 min    Activity Tolerance Patient tolerated treatment well    Behavior During Therapy [facility] for tasks assessed/performed                   Past Medical History:  Diagnosis Date   Allergic rhinitis    sees Dr. Frutoso    Asthma    sees Dr. Frutoso    Benign tumor of pineal gland (HCC)     last MRI 10-10-09, stable, no follow up planned   DUB (dysfunctional uterine bleeding)    GERD (gastroesophageal reflux disease)    History of bone density study 03-22-13   normal    Hypertension    NSVD (normal spontaneous vaginal delivery)    X2   Pneumonia    Routine gynecological examination    sees Dr. Curlee Guan    Past Surgical History:  Procedure Laterality Date   ABDOMINAL HYSTERECTOMY     LAVH   ANTERIOR CRUCIATE LIGAMENT REPAIR     right knee with medial and lateral meniscectomies 09/04/08   COLONOSCOPY  05/06/2021   per Dr. Julianne Gwen), benign polyps, repeat in 5 yrs   DILATION AND CURETTAGE OF UTERUS     x 2   KNEE ARTHROSCOPY Left 2018   LIGAMENT REPAIR Left 08/24/2019   Procedure: LIGAMENT REPAIR;  Surgeon: Cristy Bonner DASEN, MD;  Location: [facility];  Service: Orthopedics;  Laterality: Left;   MYOMECTOMY ABDOMINAL APPROACH     RADIAL HEAD ARTHROPLASTY Left 08/24/2019   Procedure: RADIAL HEAD ARTHROPLASTY;  Surgeon: Cristy Bonner DASEN, MD;  Location: [facility];  Service: Orthopedics;  Laterality: Left;   TONSILLECTOMY     WISDOM TOOTH EXTRACTION     Patient Active Problem List   Diagnosis Date Noted   COVID-19 virus infection 02/06/2020   Coccydynia  03/03/2014   Anxiety 01/19/2013   Perimenopause 01/19/2012   Acute upper respiratory infection 04/06/2010   Asthma with exacerbation 04/06/2010   BENIGN NEOPLASM OF PINEAL GLAND 09/24/2009   GASTROENTERITIS 07/13/2009   SORE THROAT 07/05/2009   FEVER UNSPECIFIED 07/05/2009   ASTHMA 01/22/2008   HYPERTENSION 10/12/2006   ALLERGIC RHINITIS 10/12/2006   GERD 10/08/2006   PNEUMONIA, HX OF 10/08/2006    PCP: Garnette Olmsted, MD  REFERRING PROVIDER: Fonda Olmsted, MD  REFERRING DIAG: posterior tibial tendonitis   THERAPY DIAG:  Pain in right ankle and joints of right foot  Muscle weakness (generalized)  Abnormal posture  Rationale for Evaluation and Treatment: Rehabilitation  ONSET DATE: 6+ months  SUBJECTIVE:   SUBJECTIVE STATEMENT:  The cortisone shot helped my knee and my swelling is so much better - I have ankles! I had some increased symptoms after wearing sandals but then used my stretches from HEP and meds and feel good today.  Eval: Has been told she has some arthritis in both feet - has pain and swelling in both ankles/feet.   The right side has ongoing sharp burning medial pain and swelling.  A chronic nuisance. Just started using Rt ankle brace which  has helped and I use an arch support brace on Lt side. I am low impact only for exercise - I walk several times a week 2-3 miles and go to gym several times a week for small group training for strength/stability.  I have bad knees.  Can't do any deep squats. I'm back on Meloxicam  this week.  PERTINENT HISTORY: Using Rt ankle brace and Lt arch support brace (copper elastic) ACL repair Rt knee with medial and lateral menisectomies 2010 Left knee scope years ago with ongoing Baker's cyst  PAIN:  PAIN:  Are you having pain? Yes NPRS scale: 0/10 No pain today - was a 1-2 yesterday after wearing sandals Pain location: Rt  Pain orientation: Right  PAIN TYPE: burning, sharp, and tight Pain description: intermittent   Aggravating factors: unsure Relieving factors: so far the brace is helping   PRECAUTIONS: per MD low impact activiites only due to knee and ankle histories  RED FLAGS: None   WEIGHT BEARING RESTRICTIONS: No  FALLS:  Has patient fallen in last 6 months? No  LIVING ENVIRONMENT: Lives with: lives with their spouse Lives in: House/apartment Stairs: Yes: Internal: 13 steps; on right going up and External: 4 steps; on right going up Has following equipment at home: None  OCCUPATION: recently retired  PLOF: Independent  PATIENT GOALS: figure out how to eliminate inflammation and minimize pain, figure out proper activities  NEXT MD VISIT: July 2  OBJECTIVE:  Note: Objective measures were completed at Evaluation unless otherwise noted.  DIAGNOSTIC FINDINGS:  Bone density May 2025:  IMPRESSION: Normal based on BMD. Fracture risk is unknown due to history of bone building therapy.  PATIENT SURVEYS:  08/03/23: LEFS: 63/80 Eval: LEFS: 46/80  COGNITION: Overall cognitive status: Within functional limits for tasks assessed     SENSATION: Gets tingling/numbness on dorsal aspect of feet near toes  EDEMA:  Observable pitting edema in distal Rt LE when Pt took off ankle brace   MUSCLE LENGTH: Hamstrings: WNL Bil piriformis tightness  POSTURE: increased thoracic kyphosis and Rt hip externally rotated with overpronation on Rt compared to Lt which is also pronated  PALPATION: Very tender Rt posterior tib muscle belly > tendon, with trigger points Tender bil medial gastroc with TP  LOWER EXTREMITY ROM: WFL bil  LOWER EXTREMITY MMT: Rt knee 4-/5 - protective into knee extension Lt knee 4/5 Bil ankles 4-/5 inv/eversion Bil ankle DF 4+/5 Able to perform bil heel raise  GAIT: Distance walked:  Assistive device utilized: None Level of assistance: Complete Independence Comments: overpronation                                                                                                                                  TREATMENT DATE:  08/03/23: NuStep L4 x 6'  Gastroc and soleus stretch 2x30 bil each, using slant board Ant/post rockerboard x2' 1X10 each: bil heel raise, bil heel raise ball b/w heels, single heel  raise, single heel raise leaning into palm on wall - inside leg and outside leg Hands on wall facing wall, static squat with heel raises x10 for soleus bias Prone quad stretch 2x20 with strap Supine HS stretch and ITB stretch with strap 2x20 each  07/29/23 NuStep L1 x3' (low load and time due to had Lt knee injection today) Seated heel/toe raises x1' Seated toe yoga, seated shortfoot, seated towel crunch Supine STM bil soleus, posterior tib, gastroc  6/30/025  Bike L4 x 5 min Bil heel raise holding ball b/w heels x10 Single leg heel raise with anterior UE support x10 each LE Single leg heel raise with lateral UE support - leaning into hand on wall to bias toward invertors of ankle x10 each Squat with heel raise reps x10 - hold back of chair in squat for balance support  Trigger Point Dry Needling  Subsequent Treatment: Instructions provided previously at initial dry needling treatment.   Patient Verbal Consent Given: Yes Education Handout Provided: Previously Provided Muscles Treated: B gastroc/soleus, post tibialis, flexor digitorum Electrical Stimulation Performed: No Treatment Response/Outcome: Utilized skilled palpation to identify bony landmarks and trigger points.  Able to illicit twitch response and muscle elongation.  Soft tissue mobilization to muscles needled to further promote tissue elongation and decreased pain.      PATIENT EDUCATION:  Education details: [email or web address] Person educated: Patient Education method: Programmer, multimedia, Demonstration, Verbal cues, and Handouts Education comprehension: verbalized understanding and returned demonstration  HOME EXERCISE PROGRAM: Access Code: [email or web address] URL:  [email or web address] Date: 08/03/2023 Prepared by: Orvil Jaesean Litzau  Exercises - Supine Piriformis Stretch with Leg Straight  - 1 x daily - 7 x weekly - 1 sets - 2 reps - 30 hold - Supine ITB Stretch with Strap  - 1 x daily - 7 x weekly - 1 sets - 2 reps - 30 hold - Gastroc Stretch on Wall  - 1 x daily - 7 x weekly - 1 sets - 2 reps - 30 hold - Standing Soleus Stretch  - 1 x daily - 7 x weekly - 1 sets - 2 reps - 30 hold - Heel Toe Raises with Counter Support  - 1 x daily - 7 x weekly - 2 sets - 10 reps - Standing Calf Raise With Small Ball at Heels  - 1 x daily - 7 x weekly - 2 sets - 10 reps - Single Leg Heel Raise with Counter Support  - 1 x daily - 7 x weekly - 1-2 sets - 10 reps - Seated Ankle Eversion with Resistance  - 1 x daily - 7 x weekly - 2 sets - 10 reps - Long Sitting Ankle Inversion with Anchored Resistance  - 1 x daily - 7 x weekly - 2 sets - 10 reps - Ankle Inversion with Anchored Resistance at Table  - 1 x daily - 7 x weekly - 2 sets - 10 reps - Single Leg Balance with Clock Reach  - 1 x daily - 3 x weekly - 2 sets - 10 reps - Standing March  - 1 x daily - 7 x weekly - 2 sets - 10 reps - Prone Quadriceps Stretch with Strap  - 1 x daily - 7 x weekly - 3 sets - 10 reps  ASSESSMENT:  CLINICAL IMPRESSION: Patient reporting significant improvement overall.  She had some slight increase in pain after wearing 1 sandals over the weekend but was able to use HEP to improve symptoms back to 0/10.  Her LEFS today  is 63/80 which is a significant improvement from 46/80 at eval.  She is traveling to beach next week so we will see how she does using HEP to minimize symptoms.  She is working with a Psychologist, educational who will incorporate latest HEP into her workouts alongside and after PT ends.  We will discuss tapering schedule after she returns from the beach as long as she has continued to do well.  Eval: Patient is a 63 y.o. female who was seen today for physical therapy evaluation  and treatment for Rt posterior tibial tendonitis. Pt has bil ankle arthritis and presents with Rt hip ER with signif overprontation Rt>Lt.  Her pain is described as chronic, ranging from 0-3/10, and presenting as shooting and sharp along medial aspect of Rt ankle with some tingling/numbness to dorsal aspect of foot/toes.  She has history of bil knee surgeries (Rt ACL and bil scopes).  She is active but maintains a low impact routine of walking and strength training and avoids squatting due to knee history.  Strength of bil LE range from 4-/5 to 4/5, with weakness mostly in Rt knee and ankle.  LEFS score is 46/80 demonstrating signif impact of symptoms on function.  Pt will benefit from skilled PT including DN, Pt education, inflammation reduction modalities and exercise to optimize her posture, mechanics and participation in daily activities.  OBJECTIVE IMPAIRMENTS: Abnormal gait, decreased activity tolerance, decreased knowledge of condition, decreased mobility, decreased strength, increased edema, increased muscle spasms, impaired flexibility, impaired sensation, impaired tone, improper body mechanics, postural dysfunction, and pain.   ACTIVITY LIMITATIONS: sitting, standing, squatting, stairs, transfers, and locomotion level  PARTICIPATION LIMITATIONS: cleaning, laundry, community activity, and yard work  PERSONAL FACTORS: Time since onset of injury/illness/exacerbation and 1 comorbidity: bil knee pain with surgical history are also affecting patient's functional outcome.   REHAB POTENTIAL: Excellent  CLINICAL DECISION MAKING: Stable/uncomplicated  EVALUATION COMPLEXITY: Low   GOALS: Goals reviewed with patient? Yes  SHORT TERM GOALS: Target date: 07/31/23 Pt will be ind with initial HEP without exacerbation of symptoms Baseline: Goal status: MET 6/23  2.  Pt will be educated on edema reduction techniques to use at home Baseline:  Goal status: MET 6/23  3.  Pt will explore options for  orthotic inserts to optimize foot mechanics whether it is OTC or custom made Baseline:  Goal status: MET 6/23 - GOT OTC ORTHOTICS   4.  Pt will report reduced pain by at least 25% with daily activities. Baseline:  Goal status: MET 6/25    LONG TERM GOALS: Target date: 08/28/23  Pt will be ind with advanced HEP and understand importance of continued compliance upon D/C from PT Baseline:  Goal status: ONGOING 6/23  2.  Pt will improve strength of Rt ankle to at least 4+/5 to improve dynamic support of ankle mechanics with walking Baseline:  Goal status: INITIAL  3.  Pt will improve LEFS score to at least 56/80 to demo improved function Baseline: 46/80 Goal status: MET 08/03/23  4.  Pt will report at least 75% improvement in Rt ankle pain with daily activities and exericse. Baseline:  Goal status: ONGOING, 70%     PLAN:  PT FREQUENCY: 2x/week  PT DURATION: 8 weeks  PLANNED INTERVENTIONS: 97110-Therapeutic exercises, 97530- Therapeutic activity, [id number]- Neuromuscular re-education, 97535- Self Care, [id number]- Manual therapy, [id number]- Gait training, [phone]- Aquatic Therapy, [phone]- Electrical stimulation (unattended), [id number]- Electrical stimulation (manual), [id number]- Vasopneumatic device, [id number]- Ionotophoresis 4mg /ml Dexamethasone , [id number] (1-2 muscles), [id number] (3+ muscles)- Dry Needling, Patient/Family  education, Taping, Joint mobilization, and Cryotherapy  PLAN FOR NEXT SESSION: dry needle next time before Pt goes to beach, we will plan on discussing tapering plan of care after beach if Pt did well, review HEP progression, repeat DN to bil calves and use marinating technique in soleus and posterior tib, use caution with therex for bil knee pain, work on balance, progress Rt ankle strength as tol.    Caty Tessler, PT 08/03/23 11:37 AM

## 2023-08-04 NOTE — Therapy (Signed)
 OUTPATIENT PHYSICAL THERAPY LOWER EXTREMITY TREATMENT   Patient Name: Olivia Carr MRN: [id number] DOB:22-Sep-1960, 63 y.o., female Today's Date: 08/05/2023  END OF SESSION:  PT End of Session - 08/05/23 0849     Visit Number 11    Date for PT Re-Evaluation 08/28/23    Authorization Type [employer]    PT Start Time [phone]    PT Stop Time 0930    PT Time Calculation (min) 41 min    Activity Tolerance Patient tolerated treatment well    Behavior During Therapy [facility] for tasks assessed/performed                    Past Medical History:  Diagnosis Date   Allergic rhinitis    sees Dr. Frutoso    Asthma    sees Dr. Frutoso    Benign tumor of pineal gland (HCC)     last MRI 10-10-09, stable, no follow up planned   DUB (dysfunctional uterine bleeding)    GERD (gastroesophageal reflux disease)    History of bone density study 03-22-13   normal    Hypertension    NSVD (normal spontaneous vaginal delivery)    X2   Pneumonia    Routine gynecological examination    sees Dr. Curlee Guan    Past Surgical History:  Procedure Laterality Date   ABDOMINAL HYSTERECTOMY     LAVH   ANTERIOR CRUCIATE LIGAMENT REPAIR     right knee with medial and lateral meniscectomies 09/04/08   COLONOSCOPY  05/06/2021   per Dr. Julianne Gwen), benign polyps, repeat in 5 yrs   DILATION AND CURETTAGE OF UTERUS     x 2   KNEE ARTHROSCOPY Left 2018   LIGAMENT REPAIR Left 08/24/2019   Procedure: LIGAMENT REPAIR;  Surgeon: Cristy Bonner DASEN, MD;  Location: [facility];  Service: Orthopedics;  Laterality: Left;   MYOMECTOMY ABDOMINAL APPROACH     RADIAL HEAD ARTHROPLASTY Left 08/24/2019   Procedure: RADIAL HEAD ARTHROPLASTY;  Surgeon: Cristy Bonner DASEN, MD;  Location: [facility];  Service: Orthopedics;  Laterality: Left;   TONSILLECTOMY     WISDOM TOOTH EXTRACTION     Patient Active Problem List   Diagnosis Date Noted   COVID-19 virus infection 02/06/2020   Coccydynia  03/03/2014   Anxiety 01/19/2013   Perimenopause 01/19/2012   Acute upper respiratory infection 04/06/2010   Asthma with exacerbation 04/06/2010   BENIGN NEOPLASM OF PINEAL GLAND 09/24/2009   GASTROENTERITIS 07/13/2009   SORE THROAT 07/05/2009   FEVER UNSPECIFIED 07/05/2009   ASTHMA 01/22/2008   HYPERTENSION 10/12/2006   ALLERGIC RHINITIS 10/12/2006   GERD 10/08/2006   PNEUMONIA, HX OF 10/08/2006    PCP: Garnette Olmsted, MD  REFERRING PROVIDER: Fonda Olmsted, MD  REFERRING DIAG: posterior tibial tendonitis   THERAPY DIAG:  Pain in right ankle and joints of right foot  Muscle weakness (generalized)  Abnormal posture  Rationale for Evaluation and Treatment: Rehabilitation  ONSET DATE: 6+ months  SUBJECTIVE:   SUBJECTIVE STATEMENT:  Yesterday I felt so good. On my feet all day and went to gym. No nighttime cramps since DN, but did wake up with a left cramp this morning. Maybe from going to gym yesterday. Wore regular Brooks yesterday.   Eval: Has been told she has some arthritis in both feet - has pain and swelling in both ankles/feet.   The right side has ongoing sharp burning medial pain and swelling.  A chronic nuisance. Just started using Rt  ankle brace which has helped and I use an arch support brace on Lt side. I am low impact only for exercise - I walk several times a week 2-3 miles and go to gym several times a week for small group training for strength/stability.  I have bad knees.  Can't do any deep squats. I'm back on Meloxicam  this week.  PERTINENT HISTORY: Using Rt ankle brace and Lt arch support brace (copper elastic) ACL repair Rt knee with medial and lateral menisectomies 2010 Left knee scope years ago with ongoing Baker's cyst  PAIN:  PAIN:  Are you having pain? Yes NPRS scale: 0/10 No pain today - was a 1-2 yesterday after wearing sandals Pain location: Rt  Pain orientation: Right  PAIN TYPE: burning, sharp, and tight Pain description: intermittent   Aggravating factors: unsure Relieving factors: so far the brace is helping   PRECAUTIONS: per MD low impact activiites only due to knee and ankle histories  RED FLAGS: None   WEIGHT BEARING RESTRICTIONS: No  FALLS:  Has patient fallen in last 6 months? No  LIVING ENVIRONMENT: Lives with: lives with their spouse Lives in: House/apartment Stairs: Yes: Internal: 13 steps; on right going up and External: 4 steps; on right going up Has following equipment at home: None  OCCUPATION: recently retired  PLOF: Independent  PATIENT GOALS: figure out how to eliminate inflammation and minimize pain, figure out proper activities  NEXT MD VISIT: July 2  OBJECTIVE:  Note: Objective measures were completed at Evaluation unless otherwise noted.  DIAGNOSTIC FINDINGS:  Bone density May 2025:  IMPRESSION: Normal based on BMD. Fracture risk is unknown due to history of bone building therapy.  PATIENT SURVEYS:  08/03/23: LEFS: 63/80 Eval: LEFS: 46/80  COGNITION: Overall cognitive status: Within functional limits for tasks assessed     SENSATION: Gets tingling/numbness on dorsal aspect of feet near toes  EDEMA:  Observable pitting edema in distal Rt LE when Pt took off ankle brace   MUSCLE LENGTH: Hamstrings: WNL Bil piriformis tightness  POSTURE: increased thoracic kyphosis and Rt hip externally rotated with overpronation on Rt compared to Lt which is also pronated  PALPATION: Very tender Rt posterior tib muscle belly > tendon, with trigger points Tender bil medial gastroc with TP  LOWER EXTREMITY ROM: WFL bil  LOWER EXTREMITY MMT: Rt knee 4-/5 - protective into knee extension Lt knee 4/5 Bil ankles 4-/5 inv/eversion Bil ankle DF 4+/5 Able to perform bil heel raise  GAIT: Distance walked:  Assistive device utilized: None Level of assistance: Complete Independence Comments: overpronation                                                                                                                                  TREATMENT DATE:  08/05/23: Bike L4 x 5'  Gastroc and soleus stretch 2x30 bil each, using slant board 1X10 each: single heel raise leaning into palm on wall - inside leg  and outside leg Eccentric lowering ** Toe taps to steps  Trigger Point Dry Needling  Subsequent Treatment: Instructions provided previously at initial dry needling treatment.   Patient Verbal Consent Given: Yes Education Handout Provided: Previously Provided Muscles Treated: B gastroc/soleus, post tibialis, flexor digitorum, peroneals Electrical Stimulation Performed: No Treatment Response/Outcome: Utilized skilled palpation to identify bony landmarks and trigger points.  Able to illicit twitch response and muscle elongation.  Soft tissue mobilization to muscles needled to further promote tissue elongation and decreased pain.   08/03/23: NuStep L4 x 6'  Gastroc and soleus stretch 2x30 bil each, using slant board Ant/post rockerboard x2' 1X10 each: bil heel raise, bil heel raise ball b/w heels, single heel raise, single heel raise leaning into palm on wall - inside leg and outside leg Hands on wall facing wall, static squat with heel raises x10 for soleus bias Prone quad stretch 2x20 with strap Supine HS stretch and ITB stretch with strap 2x20 each  07/29/23 NuStep L1 x3' (low load and time due to had Lt knee injection today) Seated heel/toe raises x1' Seated toe yoga, seated shortfoot, seated towel crunch Supine STM bil soleus, posterior tib, gastroc  6/30/025  Bike L4 x 5 min Bil heel raise holding ball b/w heels x10 Single leg heel raise with anterior UE support x10 each LE Single leg heel raise with lateral UE support - leaning into hand on wall to bias toward invertors of ankle x10 each Squat with heel raise reps x10 - hold back of chair in squat for balance support  Trigger Point Dry Needling  Subsequent Treatment: Instructions provided previously at  initial dry needling treatment.   Patient Verbal Consent Given: Yes Education Handout Provided: Previously Provided Muscles Treated: B gastroc/soleus, post tibialis, flexor digitorum Electrical Stimulation Performed: No Treatment Response/Outcome: Utilized skilled palpation to identify bony landmarks and trigger points.  Able to illicit twitch response and muscle elongation.  Soft tissue mobilization to muscles needled to further promote tissue elongation and decreased pain.      PATIENT EDUCATION:  Education details: [email or web address] Person educated: Patient Education method: Programmer, multimedia, Demonstration, Verbal cues, and Handouts Education comprehension: verbalized understanding and returned demonstration  HOME EXERCISE PROGRAM: Access Code: [email or web address] URL: [email or web address] Date: 08/03/2023 Prepared by: Orvil Beuhring  Exercises - Supine Piriformis Stretch with Leg Straight  - 1 x daily - 7 x weekly - 1 sets - 2 reps - 30 hold - Supine ITB Stretch with Strap  - 1 x daily - 7 x weekly - 1 sets - 2 reps - 30 hold - Gastroc Stretch on Wall  - 1 x daily - 7 x weekly - 1 sets - 2 reps - 30 hold - Standing Soleus Stretch  - 1 x daily - 7 x weekly - 1 sets - 2 reps - 30 hold - Heel Toe Raises with Counter Support  - 1 x daily - 7 x weekly - 2 sets - 10 reps - Standing Calf Raise With Small Ball at Heels  - 1 x daily - 7 x weekly - 2 sets - 10 reps - Single Leg Heel Raise with Counter Support  - 1 x daily - 7 x weekly - 1-2 sets - 10 reps - Seated Ankle Eversion with Resistance  - 1 x daily - 7 x weekly - 2 sets - 10 reps - Long Sitting Ankle Inversion with Anchored Resistance  - 1 x daily - 7 x weekly - 2 sets - 10 reps - Ankle Inversion with  Anchored Resistance at Table  - 1 x daily - 7 x weekly - 2 sets - 10 reps - Single Leg Balance with Clock Reach  - 1 x daily - 3 x weekly - 2 sets - 10 reps - Standing March  - 1 x daily - 7 x weekly - 2 sets - 10 reps - Prone Quadriceps  Stretch with Strap  - 1 x daily - 7 x weekly - 3 sets - 10 reps  ASSESSMENT:  CLINICAL IMPRESSION: Patient demonstrates significant improvement in tissue tension since last visit with this PT. She reports no cramping until this morning. She was on her feet all day yesterday and went to the gym. She also reports she was able to wear her original Brooks shoes yesterday. We discussed her pace with heel raises and incorporating a slower eccentric phase. She continues to demonstrate trigger points in B lower legs, but significantly reduced  overall. Excellent response to manual therapy DN again today. Plan to assess her response to vacation and then wean visits upon return.  Eval: Patient is a 63 y.o. female who was seen today for physical therapy evaluation and treatment for Rt posterior tibial tendonitis. Pt has bil ankle arthritis and presents with Rt hip ER with signif overprontation Rt>Lt.  Her pain is described as chronic, ranging from 0-3/10, and presenting as shooting and sharp along medial aspect of Rt ankle with some tingling/numbness to dorsal aspect of foot/toes.  She has history of bil knee surgeries (Rt ACL and bil scopes).  She is active but maintains a low impact routine of walking and strength training and avoids squatting due to knee history.  Strength of bil LE range from 4-/5 to 4/5, with weakness mostly in Rt knee and ankle.  LEFS score is 46/80 demonstrating signif impact of symptoms on function.  Pt will benefit from skilled PT including DN, Pt education, inflammation reduction modalities and exercise to optimize her posture, mechanics and participation in daily activities.  OBJECTIVE IMPAIRMENTS: Abnormal gait, decreased activity tolerance, decreased knowledge of condition, decreased mobility, decreased strength, increased edema, increased muscle spasms, impaired flexibility, impaired sensation, impaired tone, improper body mechanics, postural dysfunction, and pain.   ACTIVITY  LIMITATIONS: sitting, standing, squatting, stairs, transfers, and locomotion level  PARTICIPATION LIMITATIONS: cleaning, laundry, community activity, and yard work  PERSONAL FACTORS: Time since onset of injury/illness/exacerbation and 1 comorbidity: bil knee pain with surgical history are also affecting patient's functional outcome.   REHAB POTENTIAL: Excellent  CLINICAL DECISION MAKING: Stable/uncomplicated  EVALUATION COMPLEXITY: Low   GOALS: Goals reviewed with patient? Yes  SHORT TERM GOALS: Target date: 07/31/23 Pt will be ind with initial HEP without exacerbation of symptoms Baseline: Goal status: MET 6/23  2.  Pt will be educated on edema reduction techniques to use at home Baseline:  Goal status: MET 6/23  3.  Pt will explore options for orthotic inserts to optimize foot mechanics whether it is OTC or custom made Baseline:  Goal status: MET 6/23 - GOT OTC ORTHOTICS   4.  Pt will report reduced pain by at least 25% with daily activities. Baseline:  Goal status: MET 6/25    LONG TERM GOALS: Target date: 08/28/23  Pt will be ind with advanced HEP and understand importance of continued compliance upon D/C from PT Baseline:  Goal status: ONGOING 6/23  2.  Pt will improve strength of Rt ankle to at least 4+/5 to improve dynamic support of ankle mechanics with walking Baseline:  Goal status: INITIAL  3.  Pt will improve LEFS score to at least 56/80 to demo improved function Baseline: 46/80 Goal status: MET 08/03/23  4.  Pt will report at least 75% improvement in Rt ankle pain with daily activities and exericse. Baseline:  Goal status: ONGOING, 70%     PLAN:  PT FREQUENCY: 2x/week  PT DURATION: 8 weeks  PLANNED INTERVENTIONS: 97110-Therapeutic exercises, 97530- Therapeutic activity, [id number]- Neuromuscular re-education, 97535- Self Care, [id number]- Manual therapy, [phone]- Gait training, [phone]- Aquatic Therapy, [phone]- Electrical stimulation (unattended), [phone]-  Electrical stimulation (manual), [id number]- Vasopneumatic device, [id number]- Ionotophoresis 4mg /ml Dexamethasone , [id number] (1-2 muscles), [id number] (3+ muscles)- Dry Needling, Patient/Family education, Taping, Joint mobilization, and Cryotherapy  PLAN FOR NEXT SESSION: Assess response to vacation and begin tapering plan of care  Assess goals.   Mliss Cummins, PT  08/05/23 11:37 AM

## 2023-08-05 ENCOUNTER — Encounter: Payer: Self-pay | Admitting: Physical Therapy

## 2023-08-05 ENCOUNTER — Other Ambulatory Visit: Payer: Self-pay | Admitting: Family Medicine

## 2023-08-05 ENCOUNTER — Ambulatory Visit: Admitting: Physical Therapy

## 2023-08-05 DIAGNOSIS — M25571 Pain in right ankle and joints of right foot: Secondary | ICD-10-CM | POA: Diagnosis not present

## 2023-08-05 DIAGNOSIS — M6281 Muscle weakness (generalized): Secondary | ICD-10-CM

## 2023-08-05 DIAGNOSIS — R293 Abnormal posture: Secondary | ICD-10-CM

## 2023-08-10 ENCOUNTER — Telehealth (INDEPENDENT_AMBULATORY_CARE_PROVIDER_SITE_OTHER): Payer: Self-pay | Admitting: Physician Assistant

## 2023-08-10 ENCOUNTER — Other Ambulatory Visit (INDEPENDENT_AMBULATORY_CARE_PROVIDER_SITE_OTHER): Payer: Self-pay | Admitting: Physician Assistant

## 2023-08-10 DIAGNOSIS — H6993 Unspecified Eustachian tube disorder, bilateral: Secondary | ICD-10-CM

## 2023-08-10 DIAGNOSIS — H6990 Unspecified Eustachian tube disorder, unspecified ear: Secondary | ICD-10-CM

## 2023-08-10 NOTE — Telephone Encounter (Signed)
 Patient called and stated she needs a referral to [facility] to be sent  in order to schedule audio before seeing Dr.Patel on 7/24.

## 2023-08-10 NOTE — Telephone Encounter (Signed)
 Patient called in concerned that she cannot get an Audio done  before her next appointment through us  or [facility].  Please advise.  [phone]

## 2023-08-10 NOTE — Telephone Encounter (Signed)
 Thanks I spoke with her, she is going to keep the appointment and is on a waiting list for audiology.

## 2023-08-10 NOTE — Telephone Encounter (Signed)
 Order placed

## 2023-08-17 ENCOUNTER — Ambulatory Visit: Admitting: Physical Therapy

## 2023-08-17 ENCOUNTER — Encounter: Payer: Self-pay | Admitting: Physical Therapy

## 2023-08-17 DIAGNOSIS — R293 Abnormal posture: Secondary | ICD-10-CM

## 2023-08-17 DIAGNOSIS — M6281 Muscle weakness (generalized): Secondary | ICD-10-CM

## 2023-08-17 DIAGNOSIS — M25571 Pain in right ankle and joints of right foot: Secondary | ICD-10-CM

## 2023-08-17 NOTE — Therapy (Signed)
 OUTPATIENT PHYSICAL THERAPY LOWER EXTREMITY TREATMENT   Patient Name: Olivia Carr MRN: [id number] DOB:[birth year], 63 y.o., female Today's Date: 08/17/2023  END OF SESSION:  PT End of Session - 08/17/23 0800     Visit Number 12    Date for PT Re-Evaluation 08/28/23    Authorization Type [employer]    PT Start Time 0800    PT Stop Time 0842    PT Time Calculation (min) 42 min    Activity Tolerance Patient tolerated treatment well    Behavior During Therapy WFL for tasks assessed/performed                     Past Medical History:  Diagnosis Date   Allergic rhinitis    sees Dr. Frutoso    Asthma    sees Dr. Frutoso    Benign tumor of pineal gland (HCC)     last MRI 10-10-09, stable, no follow up planned   DUB (dysfunctional uterine bleeding)    GERD (gastroesophageal reflux disease)    History of bone density study 03-22-13   normal    Hypertension    NSVD (normal spontaneous vaginal delivery)    X2   Pneumonia    Routine gynecological examination    sees Dr. Curlee Guan    Past Surgical History:  Procedure Laterality Date   ABDOMINAL HYSTERECTOMY     LAVH   ANTERIOR CRUCIATE LIGAMENT REPAIR     right knee with medial and lateral meniscectomies 09/04/08   COLONOSCOPY  05/06/2021   per Dr. Julianne Gwen), benign polyps, repeat in 5 yrs   DILATION AND CURETTAGE OF UTERUS     x 2   KNEE ARTHROSCOPY Left 2018   LIGAMENT REPAIR Left 08/24/2019   Procedure: LIGAMENT REPAIR;  Surgeon: Cristy Bonner DASEN, MD;  Location: [facility];  Service: Orthopedics;  Laterality: Left;   MYOMECTOMY ABDOMINAL APPROACH     RADIAL HEAD ARTHROPLASTY Left 08/24/2019   Procedure: RADIAL HEAD ARTHROPLASTY;  Surgeon: Cristy Bonner DASEN, MD;  Location: [facility];  Service: Orthopedics;  Laterality: Left;   TONSILLECTOMY     WISDOM TOOTH EXTRACTION     Patient Active Problem List   Diagnosis Date Noted   COVID-19 virus infection 02/06/2020    Coccydynia 03/03/2014   Anxiety 01/19/2013   Perimenopause 01/19/2012   Acute upper respiratory infection 04/06/2010   Asthma with exacerbation 04/06/2010   BENIGN NEOPLASM OF PINEAL GLAND 09/24/2009   GASTROENTERITIS 07/13/2009   SORE THROAT 07/05/2009   FEVER UNSPECIFIED 07/05/2009   ASTHMA 01/22/2008   HYPERTENSION 10/12/2006   ALLERGIC RHINITIS 10/12/2006   GERD 10/08/2006   PNEUMONIA, HX OF 10/08/2006    PCP: Garnette Olmsted, MD  REFERRING PROVIDER: Fonda Olmsted, MD  REFERRING DIAG: posterior tibial tendonitis   THERAPY DIAG:  Pain in right ankle and joints of right foot  Muscle weakness (generalized)  Abnormal posture  Rationale for Evaluation and Treatment: Rehabilitation  ONSET DATE: 6+ months  SUBJECTIVE:   SUBJECTIVE STATEMENT:  Fell on her knees early last week and so now has a little swelling around the ankles due to that.  No cramps.   Eval: Has been told she has some arthritis in both feet - has pain and swelling in both ankles/feet.   The right side has ongoing sharp burning medial pain and swelling.  A chronic nuisance. Just started using Rt ankle brace which has helped and I use an arch support brace on Lt side.  I am low impact only for exercise - I walk several times a week 2-3 miles and go to gym several times a week for small group training for strength/stability.  I have bad knees.  Can't do any deep squats. I'm back on Meloxicam  this week.  PERTINENT HISTORY: Using Rt ankle brace and Lt arch support brace (copper elastic) ACL repair Rt knee with medial and lateral menisectomies 2010 Left knee scope years ago with ongoing Baker's cyst  PAIN:  PAIN:  Are you having pain? Yes NPRS scale: 0/10 No pain today  Pain location: Rt  Pain orientation: Right  PAIN TYPE: burning, sharp, and tight Pain description: intermittent  Aggravating factors: unsure Relieving factors: so far the brace is helping   PRECAUTIONS: per MD low impact activiites only  due to knee and ankle histories  RED FLAGS: None   WEIGHT BEARING RESTRICTIONS: No  FALLS:  Has patient fallen in last 6 months? No  LIVING ENVIRONMENT: Lives with: lives with their spouse Lives in: House/apartment Stairs: Yes: Internal: 13 steps; on right going up and External: 4 steps; on right going up Has following equipment at home: None  OCCUPATION: recently retired  PLOF: Independent  PATIENT GOALS: figure out how to eliminate inflammation and minimize pain, figure out proper activities  NEXT MD VISIT: July 2  OBJECTIVE:  Note: Objective measures were completed at Evaluation unless otherwise noted.  DIAGNOSTIC FINDINGS:  Bone density May 2025:  IMPRESSION: Normal based on BMD. Fracture risk is unknown due to history of bone building therapy.  PATIENT SURVEYS:  08/03/23: LEFS: 63/80 Eval: LEFS: 46/80  COGNITION: Overall cognitive status: Within functional limits for tasks assessed     SENSATION: Gets tingling/numbness on dorsal aspect of feet near toes  EDEMA:  Observable pitting edema in distal Rt LE when Pt took off ankle brace   MUSCLE LENGTH: Hamstrings: WNL Bil piriformis tightness  POSTURE: increased thoracic kyphosis and Rt hip externally rotated with overpronation on Rt compared to Lt which is also pronated  PALPATION: Very tender Rt posterior tib muscle belly > tendon, with trigger points Tender bil medial gastroc with TP  LOWER EXTREMITY ROM: WFL bil  LOWER EXTREMITY MMT: Rt knee 4-/5 - protective into knee extension Lt knee 4/5 Bil ankles 4-/5 inv/eversion Bil ankle DF 4+/5 Able to perform bil heel raise  08/21/23  B ankles 5/5  GAIT: Distance walked:  Assistive device utilized: None Level of assistance: Complete Independence Comments: overpronation                                                                                                                                 TREATMENT DATE:  08/17/23: Bike L4 x 5'  Gastroc  and soleus stretch 2x30 bil each, using slant board Rockerboard  ant/post x 1.5 min 1x 10 ea: B heel raises, with ball b/w heels 1X10 each: single heel raise leaning into palm on wall - inside leg  and outside leg with Eccentric lowering Hands on wall facing wall, static squat with heel raises 2x10 for soleus bias Toe taps - 1st and 2nd step to fatigue SL RDL with chair and UE support Seated ankle eversion x 10 with knees and ankles together Prone quad stretch x 1 min ea with strap Supine HS stretch and ITB stretch with strap 2x30 each  08/05/23: Bike L4 x 5'  Gastroc and soleus stretch 2x30 bil each, using slant board 1X10 each: single heel raise leaning into palm on wall - inside leg and outside leg Eccentric lowering  Toe taps to steps  Trigger Point Dry Needling  Subsequent Treatment: Instructions provided previously at initial dry needling treatment.   Patient Verbal Consent Given: Yes Education Handout Provided: Previously Provided Muscles Treated: B gastroc/soleus, post tibialis, flexor digitorum, peroneals Electrical Stimulation Performed: No Treatment Response/Outcome: Utilized skilled palpation to identify bony landmarks and trigger points.  Able to illicit twitch response and muscle elongation.  Soft tissue mobilization to muscles needled to further promote tissue elongation and decreased pain.   08/03/23: NuStep L4 x 6'  Gastroc and soleus stretch 2x30 bil each, using slant board Ant/post rockerboard x2' 1X10 each: bil heel raise, bil heel raise ball b/w heels, single heel raise, single heel raise leaning into palm on wall - inside leg and outside leg Hands on wall facing wall, static squat with heel raises x10 for soleus bias Prone quad stretch 2x20 with strap Supine HS stretch and ITB stretch with strap 2x20 each     PATIENT EDUCATION:  Education details: [email or web address] Person educated: Patient Education method: Programmer, multimedia, Facilities manager, Verbal cues, and  Handouts Education comprehension: verbalized understanding and returned demonstration  HOME EXERCISE PROGRAM: Access Code: [email or web address] URL: [email or web address] Date: 08/03/2023 Prepared by: Orvil Beuhring  Exercises - Supine Piriformis Stretch with Leg Straight  - 1 x daily - 7 x weekly - 1 sets - 2 reps - 30 hold - Supine ITB Stretch with Strap  - 1 x daily - 7 x weekly - 1 sets - 2 reps - 30 hold - Gastroc Stretch on Wall  - 1 x daily - 7 x weekly - 1 sets - 2 reps - 30 hold - Standing Soleus Stretch  - 1 x daily - 7 x weekly - 1 sets - 2 reps - 30 hold - Heel Toe Raises with Counter Support  - 1 x daily - 7 x weekly - 2 sets - 10 reps - Standing Calf Raise With Small Ball at Heels  - 1 x daily - 7 x weekly - 2 sets - 10 reps - Single Leg Heel Raise with Counter Support  - 1 x daily - 7 x weekly - 1-2 sets - 10 reps - Seated Ankle Eversion with Resistance  - 1 x daily - 7 x weekly - 2 sets - 10 reps - Long Sitting Ankle Inversion with Anchored Resistance  - 1 x daily - 7 x weekly - 2 sets - 10 reps - Ankle Inversion with Anchored Resistance at Table  - 1 x daily - 7 x weekly - 2 sets - 10 reps - Single Leg Balance with Clock Reach  - 1 x daily - 3 x weekly - 2 sets - 10 reps - Standing March  - 1 x daily - 7 x weekly - 2 sets - 10 reps - Prone Quadriceps Stretch with Strap  - 1 x daily - 7 x weekly - 3 sets - 10 reps  ASSESSMENT:  CLINICAL IMPRESSION: Patient tolerated her vacation very well regarding her ankles and calves, however she fell on her knees which has increased her swelling some. She tolerated TE well today. Ankle strength has improved to 5/5 B. Single leg balance is still challenging B. Plan to DN next visit and then see one more time the following week.  Eval: Patient is a 63 y.o. female who was seen today for physical therapy evaluation and treatment for Rt posterior tibial tendonitis. Pt has bil ankle arthritis and presents with Rt hip ER with signif  overprontation Rt>Lt.  Her pain is described as chronic, ranging from 0-3/10, and presenting as shooting and sharp along medial aspect of Rt ankle with some tingling/numbness to dorsal aspect of foot/toes.  She has history of bil knee surgeries (Rt ACL and bil scopes).  She is active but maintains a low impact routine of walking and strength training and avoids squatting due to knee history.  Strength of bil LE range from 4-/5 to 4/5, with weakness mostly in Rt knee and ankle.  LEFS score is 46/80 demonstrating signif impact of symptoms on function.  Pt will benefit from skilled PT including DN, Pt education, inflammation reduction modalities and exercise to optimize her posture, mechanics and participation in daily activities.  OBJECTIVE IMPAIRMENTS: Abnormal gait, decreased activity tolerance, decreased knowledge of condition, decreased mobility, decreased strength, increased edema, increased muscle spasms, impaired flexibility, impaired sensation, impaired tone, improper body mechanics, postural dysfunction, and pain.   ACTIVITY LIMITATIONS: sitting, standing, squatting, stairs, transfers, and locomotion level  PARTICIPATION LIMITATIONS: cleaning, laundry, community activity, and yard work  PERSONAL FACTORS: Time since onset of injury/illness/exacerbation and 1 comorbidity: bil knee pain with surgical history are also affecting patient's functional outcome.   REHAB POTENTIAL: Excellent  CLINICAL DECISION MAKING: Stable/uncomplicated  EVALUATION COMPLEXITY: Low   GOALS: Goals reviewed with patient? Yes  SHORT TERM GOALS: Target date: 07/31/23 Pt will be ind with initial HEP without exacerbation of symptoms Baseline: Goal status: MET 6/23  2.  Pt will be educated on edema reduction techniques to use at home Baseline:  Goal status: MET 6/23  3.  Pt will explore options for orthotic inserts to optimize foot mechanics whether it is OTC or custom made Baseline:  Goal status: MET 6/23 - GOT  OTC ORTHOTICS   4.  Pt will report reduced pain by at least 25% with daily activities. Baseline:  Goal status: MET 6/25    LONG TERM GOALS: Target date: 08/28/23  Pt will be ind with advanced HEP and understand importance of continued compliance upon D/C from PT Baseline:  Goal status: ONGOING 6/23  2.  Pt will improve strength of Rt ankle to at least 4+/5 to improve dynamic support of ankle mechanics with walking Baseline:  Goal status: MET 08/17/23  3.  Pt will improve LEFS score to at least 56/80 to demo improved function Baseline: 46/80 Goal status: MET 08/03/23  4.  Pt will report at least 75% improvement in Rt ankle pain with daily activities and exericse. Baseline:  Goal status: MET 08/17/23     PLAN:  PT FREQUENCY: 2x/week  PT DURATION: 8 weeks  PLANNED INTERVENTIONS: 97110-Therapeutic exercises, 97530- Therapeutic activity, 97112- Neuromuscular re-education, 97535- Self Care, [id number]- Manual therapy, [phone]- Gait training, [phone]- Aquatic Therapy, [phone]- Electrical stimulation (unattended), [phone]- Electrical stimulation (manual), [id number]- Vasopneumatic device, [id number]- Ionotophoresis 4mg /ml Dexamethasone , 79439 (1-2 muscles), [id number] (3+ muscles)- Dry Needling, Patient/Family education, Taping, Joint mobilization, and Cryotherapy  PLAN FOR NEXT SESSION: DN, Finalize  HEP and D/C to HEP after one more visit  Mliss Cummins, PT  08/17/23 8:45 AM

## 2023-08-18 NOTE — Therapy (Signed)
 OUTPATIENT PHYSICAL THERAPY LOWER EXTREMITY TREATMENT   Patient Name: Olivia Carr MRN: [id number] DOB:[birth year], 63 y.o., female Today's Date: 08/19/2023  END OF SESSION:  PT End of Session - 08/19/23 0800     Visit Number 13    Date for PT Re-Evaluation 08/28/23    Authorization Type [employer]    PT Start Time 0800    PT Stop Time 0845    PT Time Calculation (min) 45 min    Activity Tolerance Patient tolerated treatment well    Behavior During Therapy WFL for tasks assessed/performed                      Past Medical History:  Diagnosis Date   Allergic rhinitis    sees Dr. Frutoso    Asthma    sees Dr. Frutoso    Benign tumor of pineal gland (HCC)     last MRI 10-10-09, stable, no follow up planned   DUB (dysfunctional uterine bleeding)    GERD (gastroesophageal reflux disease)    History of bone density study 03-22-13   normal    Hypertension    NSVD (normal spontaneous vaginal delivery)    X2   Pneumonia    Routine gynecological examination    sees Dr. Curlee Guan    Past Surgical History:  Procedure Laterality Date   ABDOMINAL HYSTERECTOMY     LAVH   ANTERIOR CRUCIATE LIGAMENT REPAIR     right knee with medial and lateral meniscectomies 09/04/08   COLONOSCOPY  05/06/2021   per Dr. Julianne Gwen), benign polyps, repeat in 5 yrs   DILATION AND CURETTAGE OF UTERUS     x 2   KNEE ARTHROSCOPY Left 2018   LIGAMENT REPAIR Left 08/24/2019   Procedure: LIGAMENT REPAIR;  Surgeon: Cristy Bonner DASEN, MD;  Location: [facility];  Service: Orthopedics;  Laterality: Left;   MYOMECTOMY ABDOMINAL APPROACH     RADIAL HEAD ARTHROPLASTY Left 08/24/2019   Procedure: RADIAL HEAD ARTHROPLASTY;  Surgeon: Cristy Bonner DASEN, MD;  Location: [facility];  Service: Orthopedics;  Laterality: Left;   TONSILLECTOMY     WISDOM TOOTH EXTRACTION     Patient Active Problem List   Diagnosis Date Noted   COVID-19 virus infection 02/06/2020    Coccydynia 03/03/2014   Anxiety 01/19/2013   Perimenopause 01/19/2012   Acute upper respiratory infection 04/06/2010   Asthma with exacerbation 04/06/2010   BENIGN NEOPLASM OF PINEAL GLAND 09/24/2009   GASTROENTERITIS 07/13/2009   SORE THROAT 07/05/2009   FEVER UNSPECIFIED 07/05/2009   ASTHMA 01/22/2008   HYPERTENSION 10/12/2006   ALLERGIC RHINITIS 10/12/2006   GERD 10/08/2006   PNEUMONIA, HX OF 10/08/2006    PCP: Garnette Olmsted, MD  REFERRING PROVIDER: Fonda Olmsted, MD  REFERRING DIAG: posterior tibial tendonitis   THERAPY DIAG:  Pain in right ankle and joints of right foot  Muscle weakness (generalized)  Abnormal posture  Rationale for Evaluation and Treatment: Rehabilitation  ONSET DATE: 6+ months  SUBJECTIVE:   SUBJECTIVE STATEMENT:  I feel good today. Swelling is down from Monday.    Eval: Has been told she has some arthritis in both feet - has pain and swelling in both ankles/feet.   The right side has ongoing sharp burning medial pain and swelling.  A chronic nuisance. Just started using Rt ankle brace which has helped and I use an arch support brace on Lt side. I am low impact only for exercise - I walk several times  a week 2-3 miles and go to gym several times a week for small group training for strength/stability.  I have bad knees.  Can't do any deep squats. I'm back on Meloxicam  this week.  PERTINENT HISTORY: Using Rt ankle brace and Lt arch support brace (copper elastic) ACL repair Rt knee with medial and lateral menisectomies 2010 Left knee scope years ago with ongoing Baker's cyst  PAIN:  PAIN:  Are you having pain? Yes NPRS scale: 0/10 No pain today  Pain location: Rt  Pain orientation: Right  PAIN TYPE: burning, sharp, and tight Pain description: intermittent  Aggravating factors: unsure Relieving factors: so far the brace is helping   PRECAUTIONS: per MD low impact activiites only due to knee and ankle histories  RED  FLAGS: None   WEIGHT BEARING RESTRICTIONS: No  FALLS:  Has patient fallen in last 6 months? No  LIVING ENVIRONMENT: Lives with: lives with their spouse Lives in: House/apartment Stairs: Yes: Internal: 13 steps; on right going up and External: 4 steps; on right going up Has following equipment at home: None  OCCUPATION: recently retired  PLOF: Independent  PATIENT GOALS: figure out how to eliminate inflammation and minimize pain, figure out proper activities  NEXT MD VISIT: July 2  OBJECTIVE:  Note: Objective measures were completed at Evaluation unless otherwise noted.  DIAGNOSTIC FINDINGS:  Bone density May 2025:  IMPRESSION: Normal based on BMD. Fracture risk is unknown due to history of bone building therapy.  PATIENT SURVEYS:  08/03/23: LEFS: 63/80 Eval: LEFS: 46/80  COGNITION: Overall cognitive status: Within functional limits for tasks assessed     SENSATION: Gets tingling/numbness on dorsal aspect of feet near toes  EDEMA:  Observable pitting edema in distal Rt LE when Pt took off ankle brace   MUSCLE LENGTH: Hamstrings: WNL Bil piriformis tightness  POSTURE: increased thoracic kyphosis and Rt hip externally rotated with overpronation on Rt compared to Lt which is also pronated  PALPATION: Very tender Rt posterior tib muscle belly > tendon, with trigger points Tender bil medial gastroc with TP  LOWER EXTREMITY ROM: WFL bil  LOWER EXTREMITY MMT: Rt knee 4-/5 - protective into knee extension Lt knee 4/5 Bil ankles 4-/5 inv/eversion Bil ankle DF 4+/5 Able to perform bil heel raise  08/21/23  B ankles 5/5  GAIT: Distance walked:  Assistive device utilized: None Level of assistance: Complete Independence Comments: overpronation                                                                                                                                 TREATMENT DATE:  08/19/23: Bike L4 x 5'  Gastroc and soleus stretch 2x30 bil each, using  slant board 1x 10 ea: B heel raises, 2x10 with ball b/w heels Hands on wall facing wall, static squat with heel raises 2x10 for soleus bias   Trigger Point Dry Needling  Subsequent Treatment: Instructions provided previously at initial dry needling  treatment.   Patient Verbal Consent Given: Yes Education Handout Provided: Previously Provided Muscles Treated: B gastroc/soleus, post tibialis, flexor digitorum, peroneals Electrical Stimulation Performed: No Treatment Response/Outcome: Utilized skilled palpation to identify bony landmarks and trigger points.  Able to illicit twitch response and muscle elongation.  Soft tissue mobilization to muscles needled to further promote tissue elongation and decreased pain.    08/17/23: Bike L4 x 5'  Gastroc and soleus stretch 2x30 bil each, using slant board Rockerboard  ant/post x 1.5 min 1x 10 ea: B heel raises, with ball b/w heels 1X10 each: single heel raise leaning into palm on wall - inside leg and outside leg with Eccentric lowering Hands on wall facing wall, static squat with heel raises 2x10 for soleus bias Toe taps - 1st and 2nd step to fatigue SL RDL with chair and UE support Seated ankle eversion x 10 with knees and ankles together Prone quad stretch x 1 min ea with strap Supine HS stretch and ITB stretch with strap 2x30 each  08/05/23: Bike L4 x 5'  Gastroc and soleus stretch 2x30 bil each, using slant board 1X10 each: single heel raise leaning into palm on wall - inside leg and outside leg Eccentric lowering  Toe taps to steps  Trigger Point Dry Needling  Subsequent Treatment: Instructions provided previously at initial dry needling treatment.   Patient Verbal Consent Given: Yes Education Handout Provided: Previously Provided Muscles Treated: B gastroc/soleus, post tibialis, flexor digitorum, peroneals Electrical Stimulation Performed: No Treatment Response/Outcome: Utilized skilled palpation to identify bony landmarks and  trigger points.  Able to illicit twitch response and muscle elongation.  Soft tissue mobilization to muscles needled to further promote tissue elongation and decreased pain.   08/03/23: NuStep L4 x 6'  Gastroc and soleus stretch 2x30 bil each, using slant board Ant/post rockerboard x2' 1X10 each: bil heel raise, bil heel raise ball b/w heels, single heel raise, single heel raise leaning into palm on wall - inside leg and outside leg Hands on wall facing wall, static squat with heel raises x10 for soleus bias Prone quad stretch 2x20 with strap Supine HS stretch and ITB stretch with strap 2x20 each     PATIENT EDUCATION:  Education details: [email or web address] Person educated: Patient Education method: Programmer, multimedia, Facilities manager, Verbal cues, and Handouts Education comprehension: verbalized understanding and returned demonstration  HOME EXERCISE PROGRAM: Access Code: [email or web address] URL: [email or web address] Date: 08/03/2023 Prepared by: Orvil Beuhring  Exercises - Supine Piriformis Stretch with Leg Straight  - 1 x daily - 7 x weekly - 1 sets - 2 reps - 30 hold - Supine ITB Stretch with Strap  - 1 x daily - 7 x weekly - 1 sets - 2 reps - 30 hold - Gastroc Stretch on Wall  - 1 x daily - 7 x weekly - 1 sets - 2 reps - 30 hold - Standing Soleus Stretch  - 1 x daily - 7 x weekly - 1 sets - 2 reps - 30 hold - Heel Toe Raises with Counter Support  - 1 x daily - 7 x weekly - 2 sets - 10 reps - Standing Calf Raise With Small Ball at Heels  - 1 x daily - 7 x weekly - 2 sets - 10 reps - Single Leg Heel Raise with Counter Support  - 1 x daily - 7 x weekly - 1-2 sets - 10 reps - Seated Ankle Eversion with Resistance  - 1 x daily - 7 x weekly - 2 sets - 10  reps - Long Sitting Ankle Inversion with Anchored Resistance  - 1 x daily - 7 x weekly - 2 sets - 10 reps - Ankle Inversion with Anchored Resistance at Table  - 1 x daily - 7 x weekly - 2 sets - 10 reps - Single Leg Balance with Clock Reach   - 1 x daily - 3 x weekly - 2 sets - 10 reps - Standing March  - 1 x daily - 7 x weekly - 2 sets - 10 reps - Prone Quadriceps Stretch with Strap  - 1 x daily - 7 x weekly - 3 sets - 10 reps  ASSESSMENT:  CLINICAL IMPRESSION: Patient continues to report improvements. She still has some residual trigger points in gastroc/soleus muscles and R peroneals. Much improvement in flexor digitorum B. Good response to DN. Patient has met LTGs and is ready for discharge after next 1-2 visits.  Eval: Patient is a 63 y.o. female who was seen today for physical therapy evaluation and treatment for Rt posterior tibial tendonitis. Pt has bil ankle arthritis and presents with Rt hip ER with signif overprontation Rt>Lt.  Her pain is described as chronic, ranging from 0-3/10, and presenting as shooting and sharp along medial aspect of Rt ankle with some tingling/numbness to dorsal aspect of foot/toes.  She has history of bil knee surgeries (Rt ACL and bil scopes).  She is active but maintains a low impact routine of walking and strength training and avoids squatting due to knee history.  Strength of bil LE range from 4-/5 to 4/5, with weakness mostly in Rt knee and ankle.  LEFS score is 46/80 demonstrating signif impact of symptoms on function.  Pt will benefit from skilled PT including DN, Pt education, inflammation reduction modalities and exercise to optimize her posture, mechanics and participation in daily activities.  OBJECTIVE IMPAIRMENTS: Abnormal gait, decreased activity tolerance, decreased knowledge of condition, decreased mobility, decreased strength, increased edema, increased muscle spasms, impaired flexibility, impaired sensation, impaired tone, improper body mechanics, postural dysfunction, and pain.   ACTIVITY LIMITATIONS: sitting, standing, squatting, stairs, transfers, and locomotion level  PARTICIPATION LIMITATIONS: cleaning, laundry, community activity, and yard work  PERSONAL FACTORS: Time since  onset of injury/illness/exacerbation and 1 comorbidity: bil knee pain with surgical history are also affecting patient's functional outcome.   REHAB POTENTIAL: Excellent  CLINICAL DECISION MAKING: Stable/uncomplicated  EVALUATION COMPLEXITY: Low   GOALS: Goals reviewed with patient? Yes  SHORT TERM GOALS: Target date: 07/31/23 Pt will be ind with initial HEP without exacerbation of symptoms Baseline: Goal status: MET 6/23  2.  Pt will be educated on edema reduction techniques to use at home Baseline:  Goal status: MET 6/23  3.  Pt will explore options for orthotic inserts to optimize foot mechanics whether it is OTC or custom made Baseline:  Goal status: MET 6/23 - GOT OTC ORTHOTICS   4.  Pt will report reduced pain by at least 25% with daily activities. Baseline:  Goal status: MET 6/25    LONG TERM GOALS: Target date: 08/28/23  Pt will be ind with advanced HEP and understand importance of continued compliance upon D/C from PT Baseline:  Goal status: ONGOING 6/23  2.  Pt will improve strength of Rt ankle to at least 4+/5 to improve dynamic support of ankle mechanics with walking Baseline:  Goal status: MET 08/17/23  3.  Pt will improve LEFS score to at least 56/80 to demo improved function Baseline: 46/80 Goal status: MET 08/03/23  4.  Pt will report at least 75% improvement in Rt ankle pain with daily activities and exericse. Baseline:  Goal status: MET 08/17/23     PLAN:  PT FREQUENCY: 2x/week  PT DURATION: 8 weeks  PLANNED INTERVENTIONS: 97110-Therapeutic exercises, 97530- Therapeutic activity, 97112- Neuromuscular re-education, 97535- Self Care, [id number]- Manual therapy, [phone]- Gait training, [phone]- Aquatic Therapy, [phone]- Electrical stimulation (unattended), [phone]- Electrical stimulation (manual), [id number]- Vasopneumatic device, [id number]- Ionotophoresis 4mg /ml Dexamethasone , [id number] (1-2 muscles), [id number] (3+ muscles)- Dry Needling, Patient/Family education, Taping, Joint  mobilization, and Cryotherapy  PLAN FOR NEXT SESSION: DN, Finalize HEP and D/C to HEP after one more visit  Mliss Cummins, PT  08/19/23 8:47 AM

## 2023-08-19 ENCOUNTER — Ambulatory Visit: Admitting: Physical Therapy

## 2023-08-19 ENCOUNTER — Encounter: Payer: Self-pay | Admitting: Physical Therapy

## 2023-08-19 DIAGNOSIS — M25571 Pain in right ankle and joints of right foot: Secondary | ICD-10-CM

## 2023-08-19 DIAGNOSIS — R293 Abnormal posture: Secondary | ICD-10-CM

## 2023-08-19 DIAGNOSIS — M6281 Muscle weakness (generalized): Secondary | ICD-10-CM

## 2023-08-20 ENCOUNTER — Encounter (INDEPENDENT_AMBULATORY_CARE_PROVIDER_SITE_OTHER): Payer: Self-pay | Admitting: Otolaryngology

## 2023-08-20 ENCOUNTER — Ambulatory Visit (INDEPENDENT_AMBULATORY_CARE_PROVIDER_SITE_OTHER): Admitting: Otolaryngology

## 2023-08-20 VITALS — BP 167/90 | HR 66 | Ht 65.0 in | Wt 210.0 lb

## 2023-08-20 DIAGNOSIS — H6993 Unspecified Eustachian tube disorder, bilateral: Secondary | ICD-10-CM | POA: Diagnosis not present

## 2023-08-20 DIAGNOSIS — H6693 Otitis media, unspecified, bilateral: Secondary | ICD-10-CM

## 2023-08-20 MED ORDER — OFLOXACIN 0.3 % OT SOLN: 4.0000 [drp] | Freq: Two times a day (BID) | OTIC | 0 refills | Status: AC

## 2023-08-20 NOTE — Addendum Note (Signed)
 Addended by: Jamarie Joplin on: 08/20/2023 01:47 PM   Modules accepted: Orders

## 2023-08-20 NOTE — Progress Notes (Signed)
 Otolaryngology Procedure Visit:   Procedure: Bilateral ear microscopy with bilateral myringotomy and tympanostomy tube placement (CPT [phone]) - 50 Pre-procedure diagnosis:  Bilateral recurrent otitis media Bilateral eustachian tube dysfunction Post-procedure diagnosis: same Indication: Patient is a 63 y.o. female with pre-procedure diagnoses above. 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 She is expecting to fly for the forseeable future. We discussed options: 1. Observation 2. Nasal sprays 3. Tympanostomy tube. Risks discussed, and given patient is already on medical management, patient opted for tympanostomy tube placement.  We had a long discussion about benefits and risks of Tympanostomy tube placement including pain, bleeding, infection, early or late extrusion, TM perforation, otorrhea, injury to middle or external ear structures, nature of tympanostomy tubes, cholesteatoma, lack of improvement, hearing loss, among others. Written Consent was obtained prior to proceeding.  Findings: Right Ear: well aerated Left ear: well aerated Successful tympanostomy tube placement bilaterally  Complications: None apparent  Procedure details: Patient was placed semi recumbent on the exam chair. The right ear was addressed first with binocular microscopy and any cerumen was cleaned from the ear canal. The tympanic membrane was visualized, with findings as above. A focal spot over the inferior TM (due to anterior overhang) was anesthetized with topical phenol. A myringotomy incision was then made radially. No effusion was encountered. The middle ear cleft was not suctioned. The PE tube was placed and ciprodex drops were applied. A cotton ball was placed at the meatus. The same procedure with findings above was repeated on the contralateral side except myringotomy was placed antero-inferiorly.  Patient tolerated the  procedure well  - Follow up 3 months with PA; ofloxacin  drops 4 drops BID x5d

## 2023-08-24 ENCOUNTER — Ambulatory Visit: Admitting: Physical Therapy

## 2023-08-25 NOTE — Therapy (Signed)
 OUTPATIENT PHYSICAL THERAPY LOWER EXTREMITY TREATMENT   Patient Name: Olivia Carr MRN: [id number] DOB:19-May-1960, 63 y.o., female Today's Date: 08/26/2023  END OF SESSION:  PT End of Session - 08/26/23 0803     Visit Number 14    Date for PT Re-Evaluation 08/28/23    Authorization Type [employer]    PT Start Time 0803    PT Stop Time 0843    PT Time Calculation (min) 40 min    Activity Tolerance Patient tolerated treatment well    Behavior During Therapy [facility] for tasks assessed/performed                       Past Medical History:  Diagnosis Date   Allergic rhinitis    sees Dr. Frutoso    Asthma    sees Dr. Frutoso    Benign tumor of pineal gland [facility])     last MRI 10-10-09, stable, no follow up planned   DUB (dysfunctional uterine bleeding)    GERD (gastroesophageal reflux disease)    History of bone density study 03-22-13   normal    Hypertension    NSVD (normal spontaneous vaginal delivery)    X2   Pneumonia    Routine gynecological examination    sees Dr. Curlee Guan    Past Surgical History:  Procedure Laterality Date   ABDOMINAL HYSTERECTOMY     LAVH   ANTERIOR CRUCIATE LIGAMENT REPAIR     right knee with medial and lateral meniscectomies 09/04/08   COLONOSCOPY  05/06/2021   per Dr. Julianne Gwen), benign polyps, repeat in 5 yrs   DILATION AND CURETTAGE OF UTERUS     x 2   KNEE ARTHROSCOPY Left 2018   LIGAMENT REPAIR Left 08/24/2019   Procedure: LIGAMENT REPAIR;  Surgeon: Cristy Bonner DASEN, MD;  Location: [facility];  Service: Orthopedics;  Laterality: Left;   MYOMECTOMY ABDOMINAL APPROACH     RADIAL HEAD ARTHROPLASTY Left 08/24/2019   Procedure: RADIAL HEAD ARTHROPLASTY;  Surgeon: Cristy Bonner DASEN, MD;  Location: [facility];  Service: Orthopedics;  Laterality: Left;   TONSILLECTOMY     WISDOM TOOTH EXTRACTION     Patient Active Problem List   Diagnosis Date Noted   COVID-19 virus infection 02/06/2020    Coccydynia 03/03/2014   Anxiety 01/19/2013   Perimenopause 01/19/2012   Acute upper respiratory infection 04/06/2010   Asthma with exacerbation 04/06/2010   BENIGN NEOPLASM OF PINEAL GLAND 09/24/2009   GASTROENTERITIS 07/13/2009   SORE THROAT 07/05/2009   FEVER UNSPECIFIED 07/05/2009   ASTHMA 01/22/2008   HYPERTENSION 10/12/2006   ALLERGIC RHINITIS 10/12/2006   GERD 10/08/2006   PNEUMONIA, HX OF 10/08/2006    PCP: Garnette Olmsted, MD  REFERRING PROVIDER: Fonda Olmsted, MD  REFERRING DIAG: posterior tibial tendonitis   THERAPY DIAG:  Pain in right ankle and joints of right foot  Muscle weakness (generalized)  Abnormal posture  Rationale for Evaluation and Treatment: Rehabilitation  ONSET DATE: 6+ months  SUBJECTIVE:   SUBJECTIVE STATEMENT:  Tweaked my L knee on Monday.  Avoided anything that would bother it the past two days. Better today. I was doing well before then. Walking and going to the gym. Returns to ortho on 8/13  Eval: Has been told she has some arthritis in both feet - has pain and swelling in both ankles/feet.   The right side has ongoing sharp burning medial pain and swelling.  A chronic nuisance. Just started using Rt ankle  brace which has helped and I use an arch support brace on Lt side. I am low impact only for exercise - I walk several times a week 2-3 miles and go to gym several times a week for small group training for strength/stability.  I have bad knees.  Can't do any deep squats. I'm back on Meloxicam  this week.  PERTINENT HISTORY: Using Rt ankle brace and Lt arch support brace (copper elastic) ACL repair Rt knee with medial and lateral menisectomies 2010 Left knee scope years ago with ongoing Baker's cyst  PAIN:  PAIN:  Are you having pain? Yes NPRS scale: 0/10 No pain today  Pain location: Rt ankle/knee Pain orientation: Right  PAIN TYPE: burning, sharp, and tight Pain description: intermittent  Aggravating factors: unsure Relieving  factors: so far the brace is helping   PRECAUTIONS: per MD low impact activiites only due to knee and ankle histories  RED FLAGS: None   WEIGHT BEARING RESTRICTIONS: No  FALLS:  Has patient fallen in last 6 months? No  LIVING ENVIRONMENT: Lives with: lives with their spouse Lives in: House/apartment Stairs: Yes: Internal: 13 steps; on right going up and External: 4 steps; on right going up Has following equipment at home: None  OCCUPATION: recently retired  PLOF: Independent  PATIENT GOALS: figure out how to eliminate inflammation and minimize pain, figure out proper activities  NEXT MD VISIT: Aug 13  OBJECTIVE:  Note: Objective measures were completed at Evaluation unless otherwise noted.  DIAGNOSTIC FINDINGS:  Bone density May 2025:  IMPRESSION: Normal based on BMD. Fracture risk is unknown due to history of bone building therapy.  PATIENT SURVEYS:  08/03/23: LEFS: 63/80 Eval: LEFS: 46/80  COGNITION: Overall cognitive status: Within functional limits for tasks assessed     SENSATION: Gets tingling/numbness on dorsal aspect of feet near toes  EDEMA:  Observable pitting edema in distal Rt LE when Pt took off ankle brace   MUSCLE LENGTH: Hamstrings: WNL Bil piriformis tightness  POSTURE: increased thoracic kyphosis and Rt hip externally rotated with overpronation on Rt compared to Lt which is also pronated  PALPATION: Very tender Rt posterior tib muscle belly > tendon, with trigger points Tender bil medial gastroc with TP  LOWER EXTREMITY ROM: WFL bil  LOWER EXTREMITY MMT: Rt knee 4-/5 - protective into knee extension Lt knee 4/5 Bil ankles 4-/5 inv/eversion Bil ankle DF 4+/5 Able to perform bil heel raise  08/21/23  B ankles 5/5  GAIT: Distance walked:  Assistive device utilized: None Level of assistance: Complete Independence Comments: overpronation                                                                                                                                  TREATMENT DATE:  08/26/23 Bike L4 x 5'  Gastroc and soleus stretch 2x30 bil each, using slant board Rockerboard  ant/post x 1.5 min 2 x 10 ea: B heel raises with ball  b/w heels 1X10 each: single heel raise leaning into palm on wall - inside leg and outside leg with Eccentric lowering Hands on wall facing wall, static squat with heel raises 2x10 for soleus bias Toe taps - 1st and 2nd step to fatigue B Heel raises on stairs Prone quad stretch x 1 min ea with strap Supine HS stretch and ITB stretch with strap x 30 sec ea  Manual: STM to ant tibialis and peroneals B  08/19/23: Bike L4 x 5'  Gastroc and soleus stretch 2x30 bil each, using slant board 1x 10 ea: B heel raises, 2x10 with ball b/w heels Hands on wall facing wall, static squat with heel raises 2x10 for soleus bias   Trigger Point Dry Needling  Subsequent Treatment: Instructions provided previously at initial dry needling treatment.   Patient Verbal Consent Given: Yes Education Handout Provided: Previously Provided Muscles Treated: B gastroc/soleus, post tibialis, flexor digitorum, peroneals Electrical Stimulation Performed: No Treatment Response/Outcome: Utilized skilled palpation to identify bony landmarks and trigger points.  Able to illicit twitch response and muscle elongation.  Soft tissue mobilization to muscles needled to further promote tissue elongation and decreased pain.    08/17/23: Bike L4 x 5'  Gastroc and soleus stretch 2x30 bil each, using slant board Rockerboard  ant/post x 1.5 min 1x 10 ea: B heel raises, with ball b/w heels 1X10 each: single heel raise leaning into palm on wall - inside leg and outside leg with Eccentric lowering Hands on wall facing wall, static squat with heel raises 2x10 for soleus bias Toe taps - 1st and 2nd step to fatigue SL RDL with chair and UE support Seated ankle eversion x 10 with knees and ankles together Prone quad stretch x 1 min ea  with strap Supine HS stretch and ITB stretch with strap 2x30 each  08/05/23: Bike L4 x 5'  Gastroc and soleus stretch 2x30 bil each, using slant board 1X10 each: single heel raise leaning into palm on wall - inside leg and outside leg Eccentric lowering  Toe taps to steps  Trigger Point Dry Needling  Subsequent Treatment: Instructions provided previously at initial dry needling treatment.   Patient Verbal Consent Given: Yes Education Handout Provided: Previously Provided Muscles Treated: B gastroc/soleus, post tibialis, flexor digitorum, peroneals Electrical Stimulation Performed: No Treatment Response/Outcome: Utilized skilled palpation to identify bony landmarks and trigger points.  Able to illicit twitch response and muscle elongation.  Soft tissue mobilization to muscles needled to further promote tissue elongation and decreased pain.   08/03/23: NuStep L4 x 6'  Gastroc and soleus stretch 2x30 bil each, using slant board Ant/post rockerboard x2' 1X10 each: bil heel raise, bil heel raise ball b/w heels, single heel raise, single heel raise leaning into palm on wall - inside leg and outside leg Hands on wall facing wall, static squat with heel raises x10 for soleus bias Prone quad stretch 2x20 with strap Supine HS stretch and ITB stretch with strap 2x20 each     PATIENT EDUCATION:  Education details: [email or web address] Person educated: Patient Education method: Programmer, multimedia, Facilities manager, Verbal cues, and Handouts Education comprehension: verbalized understanding and returned demonstration  HOME EXERCISE PROGRAM: Access Code: [email or web address] URL: [email or web address] Date: 08/03/2023 Prepared by: Orvil Beuhring  Exercises - Supine Piriformis Stretch with Leg Straight  - 1 x daily - 7 x weekly - 1 sets - 2 reps - 30 hold - Supine ITB Stretch with Strap  - 1 x daily - 7 x weekly - 1 sets - 2 reps -  30 hold - Gastroc Stretch on Wall  - 1 x daily - 7 x weekly - 1 sets - 2  reps - 30 hold - Standing Soleus Stretch  - 1 x daily - 7 x weekly - 1 sets - 2 reps - 30 hold - Heel Toe Raises with Counter Support  - 1 x daily - 7 x weekly - 2 sets - 10 reps - Standing Calf Raise With Small Ball at Heels  - 1 x daily - 7 x weekly - 2 sets - 10 reps - Single Leg Heel Raise with Counter Support  - 1 x daily - 7 x weekly - 1-2 sets - 10 reps - Seated Ankle Eversion with Resistance  - 1 x daily - 7 x weekly - 2 sets - 10 reps - Long Sitting Ankle Inversion with Anchored Resistance  - 1 x daily - 7 x weekly - 2 sets - 10 reps - Ankle Inversion with Anchored Resistance at Table  - 1 x daily - 7 x weekly - 2 sets - 10 reps - Single Leg Balance with Clock Reach  - 1 x daily - 3 x weekly - 2 sets - 10 reps - Standing March  - 1 x daily - 7 x weekly - 2 sets - 10 reps - Prone Quadriceps Stretch with Strap  - 1 x daily - 7 x weekly - 3 sets - 10 reps  ASSESSMENT:  CLINICAL IMPRESSION: Patient tweaked her L knee 2 days ago,but pain has resolved this morning.  Patient has met all LTGs. She has been able to return to her normal walking and workout prior to injurying knee on Monday. She tolerated all TE today with no pain. Patient reporting her balance is much better with single leg activities. Plan to place patient on hold for 30 days due to recent flare up in knee which previously would cause swelling in to ankles and subsequent pain. If patient does not return, plan to discharge to HEP.   Eval: Patient is a 63 y.o. female who was seen today for physical therapy evaluation and treatment for Rt posterior tibial tendonitis. Pt has bil ankle arthritis and presents with Rt hip ER with signif overprontation Rt>Lt.  Her pain is described as chronic, ranging from 0-3/10, and presenting as shooting and sharp along medial aspect of Rt ankle with some tingling/numbness to dorsal aspect of foot/toes.  She has history of bil knee surgeries (Rt ACL and bil scopes).  She is active but maintains a low  impact routine of walking and strength training and avoids squatting due to knee history.  Strength of bil LE range from 4-/5 to 4/5, with weakness mostly in Rt knee and ankle.  LEFS score is 46/80 demonstrating signif impact of symptoms on function.  Pt will benefit from skilled PT including DN, Pt education, inflammation reduction modalities and exercise to optimize her posture, mechanics and participation in daily activities.  OBJECTIVE IMPAIRMENTS: Abnormal gait, decreased activity tolerance, decreased knowledge of condition, decreased mobility, decreased strength, increased edema, increased muscle spasms, impaired flexibility, impaired sensation, impaired tone, improper body mechanics, postural dysfunction, and pain.   ACTIVITY LIMITATIONS: sitting, standing, squatting, stairs, transfers, and locomotion level  PARTICIPATION LIMITATIONS: cleaning, laundry, community activity, and yard work  PERSONAL FACTORS: Time since onset of injury/illness/exacerbation and 1 comorbidity: bil knee pain with surgical history are also affecting patient's functional outcome.   REHAB POTENTIAL: Excellent  CLINICAL DECISION MAKING: Stable/uncomplicated  EVALUATION COMPLEXITY: Low   GOALS:  Goals reviewed with patient? Yes  SHORT TERM GOALS: Target date: 07/31/23 Pt will be ind with initial HEP without exacerbation of symptoms Baseline: Goal status: MET 6/23  2.  Pt will be educated on edema reduction techniques to use at home Baseline:  Goal status: MET 6/23  3.  Pt will explore options for orthotic inserts to optimize foot mechanics whether it is OTC or custom made Baseline:  Goal status: MET 6/23 - GOT OTC ORTHOTICS   4.  Pt will report reduced pain by at least 25% with daily activities. Baseline:  Goal status: MET 6/25    LONG TERM GOALS: Target date: 08/28/23  Pt will be ind with advanced HEP and understand importance of continued compliance upon D/C from PT Baseline:  Goal status: ONGOING  6/23  2.  Pt will improve strength of Rt ankle to at least 4+/5 to improve dynamic support of ankle mechanics with walking Baseline:  Goal status: MET 08/17/23  3.  Pt will improve LEFS score to at least 56/80 to demo improved function Baseline: 46/80 Goal status: MET 08/03/23  4.  Pt will report at least 75% improvement in Rt ankle pain with daily activities and exericse. Baseline:  Goal status: MET 08/17/23     PLAN:  PT FREQUENCY: 2x/week  PT DURATION: 8 weeks  PLANNED INTERVENTIONS: 97110-Therapeutic exercises, 97530- Therapeutic activity, 97112- Neuromuscular re-education, 97535- Self Care, 02859- Manual therapy, [phone]- Gait training, [phone]- Aquatic Therapy, [phone]- Electrical stimulation (unattended), [phone]- Electrical stimulation (manual), [id number]- Vasopneumatic device, [id number]- Ionotophoresis 4mg /ml Dexamethasone , 20560 (1-2 muscles), 20561 (3+ muscles)- Dry Needling, Patient/Family education, Taping, Joint mobilization, and Cryotherapy  PLAN FOR NEXT SESSION: On hold until 09/24/23, then D/C.  Mliss Cummins, PT  08/26/23 9:25 AM

## 2023-08-26 ENCOUNTER — Ambulatory Visit: Admitting: Physical Therapy

## 2023-08-26 ENCOUNTER — Encounter: Payer: Self-pay | Admitting: Physical Therapy

## 2023-08-26 DIAGNOSIS — M25571 Pain in right ankle and joints of right foot: Secondary | ICD-10-CM | POA: Diagnosis not present

## 2023-08-26 DIAGNOSIS — R293 Abnormal posture: Secondary | ICD-10-CM

## 2023-08-26 DIAGNOSIS — M6281 Muscle weakness (generalized): Secondary | ICD-10-CM

## 2023-08-31 ENCOUNTER — Ambulatory Visit: Admitting: Physical Therapy

## 2023-08-31 ENCOUNTER — Telehealth (INDEPENDENT_AMBULATORY_CARE_PROVIDER_SITE_OTHER): Payer: Self-pay

## 2023-09-03 NOTE — Telephone Encounter (Signed)
 Complete

## 2023-09-11 ENCOUNTER — Ambulatory Visit: Admitting: Obstetrics

## 2023-09-14 ENCOUNTER — Ambulatory Visit (INDEPENDENT_AMBULATORY_CARE_PROVIDER_SITE_OTHER): Admitting: Otolaryngology

## 2023-09-14 ENCOUNTER — Encounter (INDEPENDENT_AMBULATORY_CARE_PROVIDER_SITE_OTHER): Payer: Self-pay | Admitting: Otolaryngology

## 2023-09-14 VITALS — BP 191/95 | HR 72 | Ht 65.0 in

## 2023-09-14 DIAGNOSIS — H938X3 Other specified disorders of ear, bilateral: Secondary | ICD-10-CM | POA: Diagnosis not present

## 2023-09-14 DIAGNOSIS — H6993 Unspecified Eustachian tube disorder, bilateral: Secondary | ICD-10-CM | POA: Diagnosis not present

## 2023-09-14 NOTE — Progress Notes (Signed)
 Dear Dr. Johnny, Here is my assessment for our mutual patient, Olivia Carr. Thank you for allowing me the opportunity to care for your patient. Please do not hesitate to contact me should you have any other questions. Sincerely, Dr. Eldora Blanch  Otolaryngology Clinic Note Referring provider: Dr. Johnny HPI:  Olivia Carr is a 63 y.o. female kindly referred by Dr. Johnny. Seen in follow up for ETD s/p tympanostomy tube placement.  Initially seen by Chyrl Cohen (multiple visits since April) -- noted sinus infection in January and then started to have ETD symptoms in March when traveling on a plane with primarily left sided symptoms. Has tried prednisone , antibiotics, nasal sprays with some improvement but continued bilateral fullness R>L. Noted type B tymps in April. Chyrl noted that despite using afrin, flonase , she has had frequent recurrence of symptoms every time she flies. She notes that for the foreseeable future, due to family out of state, she expects to fly once every few weeks. Chyrl offered a tympanostomy tubes and we did end up placing tympanostomy tubes.  She returns for follow up --------------------------------------------------------- 09/14/2023 Returns for follow up. Reports that the day of tubes, she felt like her ears felt good but then started doing drops and felt full again. She reports that her fullness is now improving and she is wondering if there is anything else to be done currently. No ear pain, drainage. Some muffling but otherwise no vertigo, hearing change. She continues with her AR management.   Personal or FHx of bleeding dz or anesthesia difficulty: no   Tobacco: no  Independent Review of Additional Tests or Records:  Audio 05/19/2023: AD/AS B/A tymps; WRT 100% AU at 50d with mild CHL    06/19/2023 Audio - WRT 100% at 50dB AU; A/A tymps, normal hearing thresholds   PMH/Meds/All/SocHx/FamHx/ROS:   Past Medical History:  Diagnosis Date   Allergic rhinitis     sees Dr. Frutoso    Asthma    sees Dr. Frutoso    Benign tumor of pineal gland [facility])     last MRI 10-10-09, stable, no follow up planned   DUB (dysfunctional uterine bleeding)    GERD (gastroesophageal reflux disease)    History of bone density study 03-22-13   normal    Hypertension    NSVD (normal spontaneous vaginal delivery)    X2   Pneumonia    Routine gynecological examination    sees Dr. Curlee Guan      Past Surgical History:  Procedure Laterality Date   ABDOMINAL HYSTERECTOMY     LAVH   ANTERIOR CRUCIATE LIGAMENT REPAIR     right knee with medial and lateral meniscectomies 09/04/08   COLONOSCOPY  05/06/2021   per Dr. Julianne Gwen), benign polyps, repeat in 5 yrs   DILATION AND CURETTAGE OF UTERUS     x 2   KNEE ARTHROSCOPY Left 2018   LIGAMENT REPAIR Left 08/24/2019   Procedure: LIGAMENT REPAIR;  Surgeon: Cristy Bonner DASEN, MD;  Location: [facility];  Service: Orthopedics;  Laterality: Left;   MYOMECTOMY ABDOMINAL APPROACH     RADIAL HEAD ARTHROPLASTY Left 08/24/2019   Procedure: RADIAL HEAD ARTHROPLASTY;  Surgeon: Cristy Bonner DASEN, MD;  Location: [facility];  Service: Orthopedics;  Laterality: Left;   TONSILLECTOMY     WISDOM TOOTH EXTRACTION      Family History  Problem Relation Age of Onset   Hypertension Mother    Pancreatic cancer Father 43   Heart disease Maternal Grandmother  Colon cancer Maternal Grandfather    Heart disease Paternal Grandmother    Heart disease Paternal Grandfather    Immunocompromised Brother    Other Other        bowel disease   Diabetes Other    Hypertension Other    Coronary artery disease Other    Breast cancer Neg Hx    BRCA 1/2 Neg Hx      Social Connections: Socially Integrated (02/26/2023)   Social Connection and Isolation Panel    Frequency of Communication with Friends and Family: More than three times a week    Frequency of Social Gatherings with Friends and Family: Twice a week     Attends Religious Services: More than 4 times per year    Active Member of [employer] or Organizations: Yes    Attends Engineer, structural: More than 4 times per year    Marital Status: Living with partner      Current Outpatient Medications:    albuterol  (VENTOLIN  HFA) 108 (90 Base) MCG/ACT inhaler, Inhale 1-2 puffs into the lungs every 6 (six) hours as needed for wheezing or shortness of breath., Disp: , Rfl:    Calcium Carbonate-Vit D-Min (CALCIUM 1200 PO), Take 1 tablet by mouth every evening. , Disp: , Rfl:    cetirizine (ZYRTEC) 10 MG tablet, Take 10 mg by mouth daily., Disp: , Rfl:    EPINEPHrine  0.3 mg/0.3 mL IJ SOAJ injection, SMARTSIG:1 Auto-Injector Injection PRN, Disp: , Rfl:    esomeprazole  (NEXIUM ) 40 MG capsule, Take 1 capsule (40 mg total) by mouth daily., Disp: 90 capsule, Rfl: 3   fluticasone  (FLONASE ) 50 MCG/ACT nasal spray, USE 2 SPRAYS IN EACH NOSTRIL DAILY, Disp: 48 g, Rfl: 3   metoprolol  succinate (TOPROL -XL) 25 MG 24 hr tablet, Take 1 tablet (25 mg total) by mouth daily., Disp: 90 tablet, Rfl: 3   montelukast  (SINGULAIR ) 10 MG tablet, Take 10 mg by mouth at bedtime., Disp: , Rfl:    potassium chloride  (KLOR-CON  M) 10 MEQ tablet, Take 1 tablet (10 mEq total) by mouth daily., Disp: 90 tablet, Rfl: 3   Physical Exam:   BP (!) 191/95 (BP Location: Right Arm, Patient Position: Sitting, Cuff Size: Large)   Pulse 72   Ht 5' 5 (1.651 m)   SpO2 94%   BMI 34.95 kg/m   Salient findings:  CN II-XII intact  Bilateral EAC clear and TM with PE tubes in place, patent, dry Weber 512: mid Rinne 512: AC > BC b/l  Anterior rhinoscopy: Septum intact; bilateral inferior turbinates without significant hypertrophy No respiratory distress or stridor  Seprately Identifiable Procedures:  Prior to initiating any procedures, risks/benefits/alternatives were explained to the patient and verbal consent obtained. None  Impression & Plans:  Olivia Carr is a 63 y.o. female  with:  1. Dysfunction of both eustachian tubes   2. Ear fullness, bilateral    Noted continued intermittent symptoms R>L despite PE tube placement. Symptoms were worst with ear drops but now have improved. We discussed options - observation, trial of steroids (unclear benefit), removal of BTT (would not advise currently given improving) After discussion, we decided to observe. If she is still having issues in next few weeks, can discuss BTT removal  F/u 8 weeks with audio  See below regarding exact medications prescribed this encounter including dosages and route: No orders of the defined types were placed in this encounter.     Thank you for allowing me the opportunity to care for your patient.  Please do not hesitate to contact me should you have any other questions.  Sincerely, Eldora Blanch, MD Otolaryngologist (ENT), [facility] Phone: [phone] Fax: [phone]  09/20/2023, 10:02 AM   I have personally spent 31 minutes involved in face-to-face and non-face-to-face activities for this patient on the day of the visit.  Professional time spent excludes any procedures performed but includes the following activities, in addition to those noted in the documentation: preparing to see the patient (review of outside documentation and results), performing a medically appropriate examination, counseling,, documenting in the electronic health record

## 2023-09-25 ENCOUNTER — Ambulatory Visit: Admitting: Obstetrics

## 2023-09-25 ENCOUNTER — Other Ambulatory Visit: Payer: Self-pay | Admitting: Obstetrics

## 2023-09-25 ENCOUNTER — Encounter: Payer: Self-pay | Admitting: Obstetrics

## 2023-09-25 VITALS — BP 138/86 | HR 56 | Ht 64.0 in | Wt 210.0 lb

## 2023-09-25 DIAGNOSIS — R159 Full incontinence of feces: Secondary | ICD-10-CM | POA: Diagnosis not present

## 2023-09-25 DIAGNOSIS — M533 Sacrococcygeal disorders, not elsewhere classified: Secondary | ICD-10-CM

## 2023-09-25 DIAGNOSIS — Z87440 Personal history of urinary (tract) infections: Secondary | ICD-10-CM

## 2023-09-25 DIAGNOSIS — R351 Nocturia: Secondary | ICD-10-CM

## 2023-09-25 DIAGNOSIS — N3946 Mixed incontinence: Secondary | ICD-10-CM | POA: Diagnosis not present

## 2023-09-25 DIAGNOSIS — N816 Rectocele: Secondary | ICD-10-CM | POA: Diagnosis not present

## 2023-09-25 DIAGNOSIS — N952 Postmenopausal atrophic vaginitis: Secondary | ICD-10-CM

## 2023-09-25 LAB — POCT URINALYSIS DIP (CLINITEK)
Bilirubin, UA: NEGATIVE
Blood, UA: NEGATIVE
Glucose, UA: NEGATIVE mg/dL
Ketones, POC UA: NEGATIVE mg/dL
Leukocytes, UA: NEGATIVE
Nitrite, UA: NEGATIVE
POC PROTEIN,UA: NEGATIVE
Spec Grav, UA: 1.02 (ref 1.010–1.025)
Urobilinogen, UA: 0.2 U/dL — AB
pH, UA: 5.5 (ref 5.0–8.0)

## 2023-09-25 MED ORDER — GEMTESA 75 MG PO TABS
75.0000 mg | ORAL_TABLET | Freq: Every day | ORAL | 2 refills | Status: DC
Start: 2023-09-25 — End: 2023-10-05

## 2023-09-25 MED ORDER — GEMTESA 75 MG PO TABS: 75.0000 mg | ORAL_TABLET | Freq: Every day | ORAL | Status: DC

## 2023-09-25 MED ORDER — ESTRADIOL 0.1 MG/GM VA CREA: TOPICAL_CREAM | VAGINAL | 3 refills | Status: AC

## 2023-09-25 MED ORDER — DICLOFENAC SODIUM 1 % EX GEL: 2.0000 g | Freq: Four times a day (QID) | CUTANEOUS | 0 refills | Status: AC

## 2023-09-25 NOTE — Progress Notes (Signed)
 New Patient Evaluation and Consultation  Referring Provider: Glennon Almarie POUR, MD PCP: Johnny Garnette LABOR, MD Date of Service: 09/25/2023  SUBJECTIVE Chief Complaint: New Patient (Initial Visit) Olivia Carr is a 63 y.o. female here today for mixed urinary incontinence.)  History of Present Illness: Olivia Carr is a 63 y.o. White or Caucasian female seen in consultation at the request of Dr Glennon for evaluation of urinary incontinence and prolapsed bladder.    Most bothered by urinary incontinence, worsened this year after respiratory/asthma issues with coughing and urinary frequency, urgency, and sensation of incomplete emptying. Started pad use due to intermittent leakage since 01/2023. Reports being on prednisone  recently  Trying to lose weight and net loss of 5lbs Takes potassium supplementation, started nature path supplementation with reduction of urinary/frequency UTI possibly associated with intercourse, increased frequency since early 56s Underwent menopause around [age].  UTIs symptoms: dysuria, blood in urine Azo with relief, usually calls for antibiotics after home test.  Reports several urinary tract infections last years Denies hospitalization for kidney infections or kidney stones.  Tried self directed pelvic floor exercises since pregnancy without relief  Review of records significant for: Asthma  Urinary Symptoms: Leaks urine with cough/ sneeze and with urgency, UTIs, respiratory meds and steroids, incomplete voiding Leaks 4 time(s) per week with urgency, more bothersome Leaks 4-5x/day when she has URI around 3x/year, lasts around 2-4 weeks Pad use: 1-3 pads per day.   Patient is bothered by UI symptoms.  Day time voids 8.  Nocturia: 1-2 times per night to void. Reports snoring, denies sleep apnea. Started around 30-40s Reports ankle and knee swelling, attributed to sciatic nerve irritation Stops drinks fluids within 3 hours of  bedtime  Voiding dysfunction:  does not empty bladder well.  Patient does not use a catheter to empty bladder.  When urinating, patient feels the need to urinate multiple times in a row and to push on her belly or vagina to empty bladder Drinks: 64oz water per day, 8oz coffee (started 2-3 years ago), 12oz soda pepsi/coke zero  Tried cutting out coffee, inconsistent relief but has increased caffeine take in the past   UTIs: 2 UTI's in the last year.   Denies history of kidney or bladder stones, pyelonephritis, bladder cancer, and kidney cancer Reports history of blood in urine during UTIs episodes only.  No results found for the last 90 days.  Pelvic Organ Prolapse Symptoms:                  Patient Admits to a feeling of a bulge at the vaginal opening. It has been present for 1 years.  Patient Denies seeing a bulge.  This bulge is not bothersome.  Bowel Symptom: Bowel movements: 1-2 time(s) per day Stool consistency: soft  Straining: no.  Splinting: no.  Incomplete evacuation: no.  Patient Admits to accidental bowel leakage / fecal incontinence  Occurs: 2-4 time(s) per year  Consistency with leakage: liquid Bowel regimen: none Last colonoscopy: Results poylp and diverticulosis, 5 year repeat HM Colonoscopy          Upcoming     Colonoscopy (Every 10 Years) Next due on 05/07/2031    05/06/2021  HM COLONOSCOPY   Only the first 1 history entries have been loaded, but more history exists.                Sexual Function Sexually active: yes.  Sexual orientation: Straight Pain with sex: No  Pelvic Pain Denies pelvic pain  Past Medical History:  Past Medical History:  Diagnosis Date   Allergic rhinitis    sees Dr. Frutoso    Asthma    sees Dr. Frutoso    Benign tumor of pineal gland [facility])     last MRI 10-10-09, stable, no follow up planned   DUB (dysfunctional uterine bleeding)    GERD (gastroesophageal reflux disease)    History of bone density study 03-22-13    normal    Hypertension    NSVD (normal spontaneous vaginal delivery)    X2   Pneumonia    Routine gynecological examination    sees Dr. Curlee Guan      Past Surgical History:   Past Surgical History:  Procedure Laterality Date   ABDOMINAL HYSTERECTOMY     LAVH   ANTERIOR CRUCIATE LIGAMENT REPAIR     right knee with medial and lateral meniscectomies 09/04/08   COLONOSCOPY  05/06/2021   per Dr. Julianne Gwen), benign polyps, repeat in 5 yrs   COLONOSCOPY  2025   DILATION AND CURETTAGE OF UTERUS     x 2   EUSTACHIAN TUBE DILATION     KNEE ARTHROSCOPY Left 2018   LIGAMENT REPAIR Left 08/24/2019   Procedure: LIGAMENT REPAIR;  Surgeon: Cristy Bonner DASEN, MD;  Location: [facility];  Service: Orthopedics;  Laterality: Left;   MYOMECTOMY ABDOMINAL APPROACH     RADIAL HEAD ARTHROPLASTY Left 08/24/2019   Procedure: RADIAL HEAD ARTHROPLASTY;  Surgeon: Cristy Bonner DASEN, MD;  Location: [facility];  Service: Orthopedics;  Laterality: Left;   TONSILLECTOMY     WISDOM TOOTH EXTRACTION       Past OB/GYN History: OB History  Gravida Para Term Preterm AB Living  3 2 2  1 2   SAB IAB Ectopic Multiple Live Births  1    2    # Outcome Date GA Lbr Len/2nd Weight Sex Type Anes PTL Lv  3 SAB           2 Term     F Vag-Spont  N LIV  1 Term     F Vag-Spont  N LIV    Vaginal deliveries: largest infant 8lb4oz, episiotomy with 3rd degree laceration repaired. Forceps/ Vacuum deliveries: forceps and vacuum use due to nuchal cord, Cesarean section: 0 Menopausal: Yes, at age [age], Denies vaginal bleeding since menopause Contraception: s/p menopause and hysterectomy for fibroids at 63yo. Last pap smear was 03/15/20.  Any history of abnormal pap smears: no. No results found for: DIAGPAP, HPVHIGH, ADEQPAP  Medications: Patient has a current medication list which includes the following prescription(s): albuterol , calcium carbonate-vit d-min, cetirizine, diclofenac   sodium, epinephrine , esomeprazole , [START ON 09/28/2023] estradiol , fluticasone , metoprolol  succinate, montelukast , potassium chloride , gemtesa , and gemtesa .   Allergies: Patient is allergic to nitrofurantoin, other, and sulfa antibiotics.   Social History:  Social History   Tobacco Use   Smoking status: Never   Smokeless tobacco: Never  Vaping Use   Vaping status: Never Used  Substance Use Topics   Alcohol use: Yes    Alcohol/week: 7.0 standard drinks of alcohol    Types: 7 Standard drinks or equivalent per week    Comment: glass of wine each day   Drug use: No    Relationship status: married Patient lives with her husband.   Patient is not employed. Regular exercise: Yes: walking and full body stretch/cardio History of abuse: No  Family History:   Family History  Problem Relation Age of Onset   Hypertension  Mother    Pancreatic cancer Father 18   Immunocompromised Brother    Heart disease Maternal Grandmother    Colon cancer Maternal Grandfather    Heart disease Paternal Grandmother    Heart disease Paternal Grandfather    Other Other        bowel disease   Diabetes Other    Hypertension Other    Coronary artery disease Other    Renal cancer Maternal Uncle    Breast cancer Neg Hx    BRCA 1/2 Neg Hx    Uterine cancer Neg Hx    Bladder Cancer Neg Hx      Review of Systems: Review of Systems  Constitutional:  Negative for fever, malaise/fatigue and weight loss.  Respiratory:  Negative for cough, shortness of breath and wheezing.   Cardiovascular:  Positive for leg swelling. Negative for chest pain and palpitations.  Gastrointestinal:  Negative for abdominal pain and blood in stool.  Genitourinary:  Negative for hematuria.  Skin:  Negative for rash.  Neurological:  Negative for dizziness, weakness and headaches.  Endo/Heme/Allergies:  Does not bruise/bleed easily.  Psychiatric/Behavioral:  Negative for depression. The patient is not nervous/anxious.       OBJECTIVE Physical Exam: Vitals:   09/25/23 0819  BP: 138/86  Pulse: (!) 56  Weight: 210 lb (95.3 kg)  Height: 5' 4 (1.626 m)   Physical Exam Constitutional:      General: She is not in acute distress.    Appearance: Normal appearance.  Genitourinary:     Bladder and urethral meatus normal.     No lesions in the vagina.     Right Labia: No rash, tenderness, lesions, skin changes or Bartholin's cyst.    Left Labia: No tenderness, lesions, skin changes, Bartholin's cyst or rash.    No vaginal discharge, erythema, tenderness, bleeding, ulceration or granulation tissue.     Anterior, posterior and apical vaginal prolapse present.    Moderate vaginal atrophy present.     Right Adnexa: not tender, not full and no mass present.    Left Adnexa: not tender, not full and no mass present.    Cervix is absent.     Uterus is absent.     Urethral meatus caruncle not present.    Urethral stress urinary incontinence with cough stress test present.     No urethral prolapse, tenderness, mass, hypermobility or discharge present.     Bladder is not tender, urgency on palpation not present and masses not present.      Pelvic Floor: Levator muscle strength is 2/5.    Levator ani not tender, obturator internus not tender, no asymmetrical contractions present and no pelvic spasms present.    Anal wink absent and BC reflex absent.     Symmetrical pelvic sensation. Rectum:     External hemorrhoid present.     No rectal mass, tenderness, abnormal anal tone or rectovaginal septum nodularity.  Cardiovascular:     Rate and Rhythm: Normal rate.  Pulmonary:     Effort: Pulmonary effort is normal. No respiratory distress.  Abdominal:     General: There is no distension.     Palpations: Abdomen is soft. There is no mass.     Tenderness: There is no abdominal tenderness.     Hernia: No hernia is present.   Musculoskeletal:       Legs:  Neurological:     Mental Status: She is alert.  Vitals  reviewed. Exam conducted with a chaperone present.  POP-Q:   POP-Q  1                                            Aa   1                                           Ba  -3                                              C   3                                            Gh  5                                            Pb  7                                            tvl   0                                            Ap  0                                            Bp                                                 D     Post-Void Residual (PVR) by Bladder Scan: In order to evaluate bladder emptying, we discussed obtaining a postvoid residual and patient agreed to this procedure.  Procedure: The ultrasound unit was placed on the patient's abdomen in the suprapubic region after the patient had voided.    Post Void Residual - 09/25/23 0833       Post Void Residual   Post Void Residual 88 mL           Laboratory Results: Lab Results  Component Value Date   COLORU yellow 09/25/2023   CLARITYU clear 09/25/2023   GLUCOSEUR negative 09/25/2023   BILIRUBINUR negative 09/25/2023   KETONESU neg 09/25/2020   SPECGRAV 1.020 09/25/2023   RBCUR negative 09/25/2023   PHUR 5.5 09/25/2023   PROTEINUR Negative 09/25/2020   UROBILINOGEN 0.2 (A) 09/25/2023   LEUKOCYTESUR Negative 09/25/2023    Lab Results  Component Value Date   CREATININE 0.75 01/02/2023   CREATININE 0.78 12/31/2021   CREATININE 0.74 12/28/2020    Lab  Results  Component Value Date   HGBA1C 5.9 01/02/2023    Lab Results  Component Value Date   HGB 13.6 01/02/2023     ASSESSMENT AND PLAN Ms. Brant is a 63 y.o. with:  1. Urinary incontinence, mixed   2. Pelvic organ prolapse quantification stage 2 rectocele   3. Vaginal atrophy   4. Incontinence of feces, unspecified fecal incontinence type   5. Nocturia   6. SI (sacroiliac) pain   7. History of recurrent UTI (urinary tract infection)      Urinary incontinence, mixed Assessment & Plan: - POCT UA + uro, PVR 88mL - urgency > stress, positive CST - We discussed the symptoms of overactive bladder (OAB), which include urinary urgency, urinary frequency, nocturia, with or without urge incontinence.  While we do not know the exact etiology of OAB, several treatment options exist. We discussed management including behavioral therapy (decreasing bladder irritants, urge suppression strategies, timed voids, bladder retraining), physical therapy, medication; for refractory cases posterior tibial nerve stimulation, sacral neuromodulation, and intravesical botulinum toxin injection.  For anticholinergic medications, we discussed the potential side effects of anticholinergics including dry eyes, dry mouth, constipation, cognitive impairment and urinary retention. For Beta-3 agonist medication, we discussed the potential side effect of elevated blood pressure which is more likely to occur in individuals with uncontrolled hypertension. - samples and Rx to start Gemtesa , switch to Mirabegron if cost prohibitive - discussed avoidance of anti-cholinergics due to potassium supplementation - encouraged fluid management, caffeine reduction and continue weight reduction - For treatment of stress urinary incontinence,  non-surgical options include expectant management, weight loss, physical therapy, as well as a pessary.  Surgical options include a midurethral sling, Burch urethropexy, and transurethral injection of a bulking agent. - referral to pelvic floor PT and start low dose vaginal estrogen  Orders: -     POCT URINALYSIS DIP (CLINITEK) -     Estradiol ; Place 0.5g nightly for two weeks then twice a week after  Dispense: 30 g; Refill: 3 -     Gemtesa ; Take 1 tablet (75 mg total) by mouth daily.  Dispense: 14 tablet -     Gemtesa ; Take 1 tablet (75 mg total) by mouth daily.  Dispense: 30 tablet; Refill: 2 -     AMB referral to  rehabilitation  Pelvic organ prolapse quantification stage 2 rectocele Assessment & Plan: - minimal symptoms noted since onset of symptoms - For treatment of pelvic organ prolapse, we discussed options for management including expectant management, conservative management, and surgical management, such as Kegels, a pessary, pelvic floor physical therapy, and specific surgical procedures. - referral to pelvic floor PT - start low dose vaginal estrogen - encouraged to continue weight reduction and splinting to assess bladder and bowel symptoms  Orders: -     AMB referral to rehabilitation  Vaginal atrophy Assessment & Plan: - For symptomatic vaginal atrophy options include lubrication with a water-based lubricant, personal hygiene measures and barrier protection against wetness, and estrogen replacement in the form of vaginal cream, vaginal tablets, or a time-released vaginal ring.   - Rx to start  low dose vaginal estrogen   Incontinence of feces, unspecified fecal incontinence type Assessment & Plan: - h/o 3rd degree laceration repaired with forceps and VAVD - Treatment options include anti-diarrhea medication (loperamide/ Imodium OTC or prescription lomotil), fiber supplements, physical therapy, and possible sacral neuromodulation or surgery.   - trial of fiber supplementation and titration as needed to optimize stool consistency - referral to pelvic  floor PT  Orders: -     AMB referral to rehabilitation  Nocturia Assessment & Plan: - avoid fluid intake 3 hours before bedtime - elevated feet during the day or use compression socks to reduce lower extremity swelling - switch bedtime dosing of medications to dinnertime - due to snoring, consider sleep study to r/o sleep apnea    SI (sacroiliac) pain Assessment & Plan: - encouraged pelvic binder, continue weight reduction and sleep with pillow between her legs on her side - discussed Voltaren  gel up to 2g 4x/day PRN pain - The  origin of muscle spasm can be multifactorial, including primary, reactive to a different pain source, trauma, or even part of a centralized pain syndrome.Treatment options include physical therapy, local or oral pain meds, injections or centrally acting pain medications.   - referral to pelvic floor PT   History of recurrent UTI (urinary tract infection) Assessment & Plan: - For treatment of recurrent urinary tract infections, we discussed diagnosis of UTI and encouraged office testing with urine culture to r/o LUTS vs. Resistant uropathogen. - management of recurrent UTIs including prophylaxis with a daily low dose antibiotic, transvaginal estrogen therapy, D-mannose, and cranberry supplements.  We discussed the role of diagnostic testing such as cystoscopy and upper tract imaging.   - start with low dose vaginal estrogen and reassess symptoms - continue pyridium  or Ibuprofen use as needed with UTI symptoms    Other orders -     Diclofenac  Sodium; Apply 2 g topically 4 (four) times daily.  Dispense: 50 g; Refill: 0  Time spent: I spent 80 minutes dedicated to the care of this patient on the date of this encounter to include pre-visit review of records, face-to-face time with the patient discussing Stage II pelvic organ prolapse, mixed urinary incontinence, vaginal atrophy, fecal incontinence, nocturia, SI pain, history of rUTI, and post visit documentation and ordering medication/ testing.   Lianne ONEIDA Gillis, MD

## 2023-09-25 NOTE — Assessment & Plan Note (Signed)
-   h/o 3rd degree laceration repaired with forceps and VAVD - Treatment options include anti-diarrhea medication (loperamide/ Imodium OTC or prescription lomotil), fiber supplements, physical therapy, and possible sacral neuromodulation or surgery.   - trial of fiber supplementation and titration as needed to optimize stool consistency - referral to pelvic floor PT

## 2023-09-25 NOTE — Assessment & Plan Note (Signed)
-   For symptomatic vaginal atrophy options include lubrication with a water-based lubricant, personal hygiene measures and barrier protection against wetness, and estrogen replacement in the form of vaginal cream, vaginal tablets, or a time-released vaginal ring.   - Rx to start low dose vaginal estrogen

## 2023-09-25 NOTE — Assessment & Plan Note (Signed)
-   encouraged pelvic binder, continue weight reduction and sleep with pillow between her legs on her side - discussed Voltaren  gel up to 2g 4x/day PRN pain - The origin of muscle spasm can be multifactorial, including primary, reactive to a different pain source, trauma, or even part of a centralized pain syndrome.Treatment options include physical therapy, local or oral pain meds, injections or centrally acting pain medications.   - referral to pelvic floor PT

## 2023-09-25 NOTE — Patient Instructions (Addendum)
 We discussed the symptoms of overactive bladder (OAB), which include urinary urgency, urinary frequency, night-time urination, with or without urge incontinence.  We discussed management including behavioral therapy (decreasing bladder irritants by following a bladder diet, urge suppression strategies, timed voids, bladder retraining), physical therapy, medication; and for refractory cases posterior tibial nerve stimulation, sacral neuromodulation, and intravesical botulinum toxin injection.   For Beta-3 agonist medication, we discussed the potential side effect of elevated blood pressure which is more likely to occur in individuals with uncontrolled hypertension. You were given samples for Gemtesa  75 mg.  It can take a month to start working so give it time, but if you have bothersome side effects call sooner and we can try a different medication.  Call us  if you have trouble filling the prescription or if it's not covered by your insurance.  If gemtesa  is not covered, please call the office and we can change the prescription to Mirabegron 25mg . Please start checking your blood pressure.  Please call [phone] to schedule the earliest appointment for pelvic floor PT.  For treatment of stress urinary incontinence, which is leakage with physical activity/movement/strainging/coughing, we discussed expectant management versus nonsurgical options versus surgery. Nonsurgical options include weight loss, physical therapy, as well as a pessary.  Surgical options include a midurethral sling, which is a synthetic mesh sling that acts like a hammock under the urethra to prevent leakage of urine, a Burch urethropexy, and transurethral injection of a bulking agent.   For vaginal atrophy (thinning of the vaginal tissue that can cause dryness and burning) and UTI prevention we discussed estrogen replacement in the form of vaginal cream.   Start vaginal estrogen therapy nightly for two weeks then 2 times weekly  at night. This can be placed with your finger or an applicator inside the vagina and around the urethra.  Please let us  know if the prescription is too expensive and we can look for alternative options.   Is vaginal estrogen therapy safe for me? Vaginal estrogen preparations act on the vaginal skin, and only a very tiny amount is absorbed into the bloodstream (0.01%).  They work in a similar way to hand or face cream.  There is minimal absorption and they are therefore perfectly safe. If you have had breast cancer and have persistent troublesome symptoms which aren't settling with vaginal moisturisers and lubricants, local estrogen treatment may be a possibility, but consultation with your oncologist should take place first.   You have a stage 2 (out of 4) prolapse.  We discussed the fact that it is not life threatening but there are several treatment options. For treatment of pelvic organ prolapse, we discussed options for management including expectant management, conservative management, and surgical management, such as Kegels, a pessary, pelvic floor physical therapy, and specific surgical procedures.     Accidental Bowel Leakage:  - Treatment options include anti-diarrhea medication (loperamide/ Imodium OTC or prescription lomotil), fiber supplements, physical therapy, and possible sacral neuromodulation or surgery.

## 2023-09-25 NOTE — Assessment & Plan Note (Signed)
-   For treatment of recurrent urinary tract infections, we discussed diagnosis of UTI and encouraged office testing with urine culture to r/o LUTS vs. Resistant uropathogen. - management of recurrent UTIs including prophylaxis with a daily low dose antibiotic, transvaginal estrogen therapy, D-mannose, and cranberry supplements.  We discussed the role of diagnostic testing such as cystoscopy and upper tract imaging.   - start with low dose vaginal estrogen and reassess symptoms - continue pyridium  or Ibuprofen use as needed with UTI symptoms

## 2023-09-25 NOTE — Assessment & Plan Note (Addendum)
-   minimal symptoms noted since onset of symptoms - For treatment of pelvic organ prolapse, we discussed options for management including expectant management, conservative management, and surgical management, such as Kegels, a pessary, pelvic floor physical therapy, and specific surgical procedures. - referral to pelvic floor PT - start low dose vaginal estrogen - encouraged to continue weight reduction and splinting to assess bladder and bowel symptoms

## 2023-09-25 NOTE — Assessment & Plan Note (Addendum)
-   POCT UA + uro, PVR 88mL - urgency > stress, positive CST - We discussed the symptoms of overactive bladder (OAB), which include urinary urgency, urinary frequency, nocturia, with or without urge incontinence.  While we do not know the exact etiology of OAB, several treatment options exist. We discussed management including behavioral therapy (decreasing bladder irritants, urge suppression strategies, timed voids, bladder retraining), physical therapy, medication; for refractory cases posterior tibial nerve stimulation, sacral neuromodulation, and intravesical botulinum toxin injection.  For anticholinergic medications, we discussed the potential side effects of anticholinergics including dry eyes, dry mouth, constipation, cognitive impairment and urinary retention. For Beta-3 agonist medication, we discussed the potential side effect of elevated blood pressure which is more likely to occur in individuals with uncontrolled hypertension. - samples and Rx to start Gemtesa , switch to Mirabegron if cost prohibitive - discussed avoidance of anti-cholinergics due to potassium supplementation - encouraged fluid management, caffeine reduction and continue weight reduction - For treatment of stress urinary incontinence,  non-surgical options include expectant management, weight loss, physical therapy, as well as a pessary.  Surgical options include a midurethral sling, Burch urethropexy, and transurethral injection of a bulking agent. - referral to pelvic floor PT and start low dose vaginal estrogen

## 2023-09-25 NOTE — Assessment & Plan Note (Signed)
-   avoid fluid intake 3 hours before bedtime - elevated feet during the day or use compression socks to reduce lower extremity swelling - switch bedtime dosing of medications to dinnertime - due to snoring, consider sleep study to r/o sleep apnea

## 2023-09-30 ENCOUNTER — Other Ambulatory Visit: Payer: Self-pay

## 2023-09-30 ENCOUNTER — Encounter: Payer: Self-pay | Admitting: Physical Therapy

## 2023-09-30 ENCOUNTER — Ambulatory Visit: Attending: Obstetrics | Admitting: Physical Therapy

## 2023-09-30 DIAGNOSIS — R278 Other lack of coordination: Secondary | ICD-10-CM | POA: Diagnosis present

## 2023-09-30 DIAGNOSIS — M6281 Muscle weakness (generalized): Secondary | ICD-10-CM | POA: Diagnosis present

## 2023-09-30 NOTE — Patient Instructions (Signed)
 The first picture shows that there is no effect on the pelvic floor with gravity eliminated. The next three show that with a wedge pillow or a few pillows from home under your pelvis the pelvic floor is inverted and may relax and allows gravity to help return prolapsed areas more inward to help relieve symptoms. Do this 15-20 mins every evening when symptoms tend to be worse. Stop if you have pain or negative symptoms.    About Pelvic Support Problems Pelvic Support Problems Explained Ligaments, muscles, and connective tissue normally hold your bladder, uterus, and other organs in their proper places in your pelvis. When these tissues become weak, a problem with pelvic support may result. Weak support can cause one or more of the pelvic organs to drop down into the vagina. An organ may even drop so far that is partially exposed outside the body.  Pelvic support problems are named by the change in the organ. The main types of pelvic support problems are:  Cystocele: When the bladder drops down into your vagina.  Enterocele: When your small intestine drops between your vagina and rectum.  Rectocele: When your rectum bulges into the vaginal wall.  Uterine prolapse: When your uterus drops into your vagina.  Vaginal prolapse: When the top part of the vagina begins to droop. This sometimes happens after a hysterectomy (removal of the uterus).  Causes Pelvic support problems can be caused by many conditions. They may begin after you give birth, especially if you had a large baby. During childbirth, the muscles and skin of the birth canal (vagina) are stretched and sometimes torn. They heal over time but are not always exactly the same. A long pushing stage of labor may also weaken these tissues as well as very rapid births as the tissues do not have time to stretch so they tear.  Also, after menopause, there are changes in the vaginal walls resulting from a decrease in estrogen. Estrogen helps to keep the  tissues toned. Low levels of estrogen weaken the vaginal walls and may cause the bladder to shift from its normal position. As women get older, the loss of muscle tone and the relaxation of muscles may cause the uterus or other organs to drop.  Over time, conditions like chronic coughing, chronic constipation, doing a lot of heavy lifting, straining to pass stool, and obesity, can also weaken the pelvic support muscles.  Diagnosing Pelvic Support Problems Your health care provider will ask about your symptoms and do a pelvic examination. Your provider may also do a rectal exam during your pelvic exam. Your provider may ask you to: 1. Bear down and push (like you are having a bowel movement) so he or she can see if your bladder or other part of your body protrudes into the vagina. 2. Contract the muscles of your pelvis to check the strength of your pelvic muscles.  3. Do several types of urine, nerve and muscle tests of the pelvis and around the bladder to see what type of treatment is best for you.   Symptoms Symptoms of pelvic support problems depend on the organ involved, but may include:  urine leakage  stain or fecal loss after a bowel movement trouble having bowel movements  ache in the lower abdomen, groin, or lower back  bladder infection  a feeling of heaviness, pulling, or fullness in the pelvis, or a feeling that something is falling out of the vagina  an organ protruding from your vaginal opening  feeling  the need to support the organs or perineal area to empty bladder or bowels painful sexual intercourse.  Many women feel pelvic pressure or trouble holding their urine immediately after childbirth. For some, these symptoms go away permanently, in others they return as they get older.  Treatment Options A prolapsed organ cannot repair itself. Contact your health care provider as soon as you notice symptoms of a problem. Treatment depends on what the specific problem is and how far  advanced it is.  The symptoms caused by some pelvic support problems may simply be treated with changes in diet, medicine to soften the stool, weight loss, or avoiding strenuous activities. You may also do pelvic floor exercises to help strengthen your pelvic muscles.  Some cases of prolapse may require a special support device made from plastic or rubber called a pessary that fits into the vagina to support the uterus, vagina, or bladder. A pessary can also help women who leak urine when coughing, straining, or exercising. In mild cases, a tampon or vaginal diaphragm may be used instead of a pessary.  Talk to your doctor or health care provider about these options. In serious cases, surgery may be needed to put the organs back into their proper place. The uterus may be removed because of the pressure it puts on the bladder.  Your doctor will know what surgery will be best for you. How can I prevent pelvic support problems?  You can help prevent pelvic support problems by:  maintaining a healthy lifestyle  continuing to do pelvic floor exercises after you deliver a baby  maintaining a healthy weight  avoiding a lot of heavy lifting and lifting with your legs (not from your waist)  treating constipation and avoid getting       [facility] [address] [place], [place] [address] Phone # [phone] Fax [phone]

## 2023-09-30 NOTE — Therapy (Signed)
 OUTPATIENT PHYSICAL THERAPY FEMALE PELVIC EVALUATION   Patient Name: Joselynne Killam MRN: [id number] DOB:January 07, 1961, 63 y.o., female Today's Date: 09/30/2023  END OF SESSION:  PT End of Session - 09/30/23 0811     Visit Number 1    Date for PT Re-Evaluation 03/01/24    Authorization Type [employer]    PT Start Time 0800    PT Stop Time 0840    PT Time Calculation (min) 40 min    Activity Tolerance Patient tolerated treatment well    Behavior During Therapy WFL for tasks assessed/performed          Past Medical History:  Diagnosis Date   Allergic rhinitis    sees Dr. Frutoso    Asthma    sees Dr. Frutoso    Benign tumor of pineal gland (HCC)     last MRI 10-10-09, stable, no follow up planned   DUB (dysfunctional uterine bleeding)    GERD (gastroesophageal reflux disease)    History of bone density study 03-22-13   normal    Hypertension    NSVD (normal spontaneous vaginal delivery)    X2   Pneumonia    Routine gynecological examination    sees Dr. Curlee Guan    Past Surgical History:  Procedure Laterality Date   ABDOMINAL HYSTERECTOMY     LAVH   ANTERIOR CRUCIATE LIGAMENT REPAIR     right knee with medial and lateral meniscectomies 09/04/08   COLONOSCOPY  05/06/2021   per Dr. Julianne Gwen), benign polyps, repeat in 5 yrs   COLONOSCOPY  2025   DILATION AND CURETTAGE OF UTERUS     x 2   EUSTACHIAN TUBE DILATION     KNEE ARTHROSCOPY Left 2018   LIGAMENT REPAIR Left 08/24/2019   Procedure: LIGAMENT REPAIR;  Surgeon: Cristy Bonner DASEN, MD;  Location: [facility];  Service: Orthopedics;  Laterality: Left;   MYOMECTOMY ABDOMINAL APPROACH     RADIAL HEAD ARTHROPLASTY Left 08/24/2019   Procedure: RADIAL HEAD ARTHROPLASTY;  Surgeon: Cristy Bonner DASEN, MD;  Location: [facility];  Service: Orthopedics;  Laterality: Left;   TONSILLECTOMY     WISDOM TOOTH EXTRACTION     Patient Active Problem List   Diagnosis Date Noted   Urinary  incontinence, mixed 09/25/2023   Pelvic organ prolapse quantification stage 2 rectocele 09/25/2023   Vaginal atrophy 09/25/2023   Incontinence of feces 09/25/2023   Nocturia 09/25/2023   History of recurrent UTI (urinary tract infection) 09/25/2023   COVID-19 virus infection 02/06/2020   SI (sacroiliac) pain 03/03/2014   Anxiety 01/19/2013   Perimenopause 01/19/2012   Acute upper respiratory infection 04/06/2010   Asthma with exacerbation 04/06/2010   BENIGN NEOPLASM OF PINEAL GLAND 09/24/2009   GASTROENTERITIS 07/13/2009   SORE THROAT 07/05/2009   FEVER UNSPECIFIED 07/05/2009   ASTHMA 01/22/2008   HYPERTENSION 10/12/2006   ALLERGIC RHINITIS 10/12/2006   GERD 10/08/2006   PNEUMONIA, HX OF 10/08/2006    PCP: Johnny garnette LABOR, MD  REFERRING PROVIDER: Guadlupe Lianne DASEN, MD   REFERRING DIAG:  N39.46 (ICD-10-CM) - Urinary incontinence, mixed  N81.6 (ICD-10-CM) - Pelvic organ prolapse quantification stage 2 rectocele  R15.9 (ICD-10-CM) - Incontinence of feces, unspecified fecal incontinence type    THERAPY DIAG:  Other lack of coordination - Plan: PT plan of care cert/re-cert  Muscle weakness (generalized) - Plan: PT plan of care cert/re-cert  Rationale for Evaluation and Treatment: Rehabilitation  ONSET DATE: 2025  SUBJECTIVE:  SUBJECTIVE STATEMENT: Patient reports she had a terrible cough increasing the urinary leakage. The prolapse bladder did not realize since her GYN visit.  Fluid intake: water, soda, coffee, juice  PAIN:  Are you having pain? No  PRECAUTIONS: None  RED FLAGS: None   WEIGHT BEARING RESTRICTIONS: No  FALLS:  Has patient fallen in last 6 months? No  OCCUPATION: retired  ACTIVITY LEVEL : gym 2 days per week for full body workout, walking 1-2 times per week  PLOF:  Independent  PATIENT GOALS: reduce leakage, manage prolapse, reduce frequency  PERTINENT HISTORY:  Hypertension; Myomectomy with abdominal approach; Abdominal Hysterctomy Sexual abuse: No  BOWEL MOVEMENT: Pain with bowel movement: No Type of bowel movement:Strain none Fully empty rectum: Yes: goes multiple times per day   URINATION: Pain with urination: No Fully empty bladder: Yes: due to having to go several times, stand up then has to urinate Stream: Strong Urgency: Yes , has to gt to the bathroom quickly Frequency: day-10 times; night- 2 times Leakage: Urge to void, Walking to the bathroom, and Coughing Pads: Yes: 1 per day for security  INTERCOURSE:  Ability to have vaginal penetration Yes  Pain with intercourse: none  PREGNANCY: Vaginal deliveries 2 Tearing Yes: 3rd degree tear  PROLAPSE: Pressure and Bulge   OBJECTIVE:  Note: Objective measures were completed at Evaluation unless otherwise noted.  DIAGNOSTIC FINDINGS:  none  PATIENT SURVEYS:  PFIQ-7: 33 UIQ-7 33  COGNITION: Overall cognitive status: Within functional limits for tasks assessed     SENSATION: Light touch:    FUNCTIONAL TESTS:  Single leg stance : right 3 sec, left 4 sec   POSTURE: rounded shoulders, forward head, increased thoracic kyphosis, and posterior pelvic tilt   LUMBARAROM/PROM:  A/PROM A/PROM  eval  Flexion full  Extension Decreased by 25%  Right lateral flexion Decreased by 25%  Left lateral flexion Decreased by 25%  Right rotation Decreased by 25%  Left rotation Decreased by 25%   (Blank rows = not tested)  LOWER EXTREMITY ROM:  Passive ROM Right eval Left eval  Hip internal rotation 20 0   (Blank rows = not tested)  LOWER EXTREMITY MMT:  MMT Right eval Left eval  Hip flexion 5/5 4/5  Hip extension 4/5 4/5  Hip abduction 4/5 4/5   (Blank rows = not tested) PALPATION:  Pelvic Alignment: ASIS are equal  Abdominal: difficulty with contracting the  abdominals                External Perineal Exam: scar along the perineal body is restricted                              Internal Pelvic Floor: tightness along the posterior vaginal canal  Patient confirms identification and approves PT to assess internal pelvic floor and treatment Yes No emotional/communication barriers or cognitive limitation. Patient is motivated to learn. Patient understands and agrees with treatment goals and plan. PT explains patient will be examined in standing, sitting, and lying down to see how their muscles and joints work. When they are ready, they will be asked to remove their underwear so PT can examine their perineum. The patient is also given the option of providing their own chaperone as one is not provided in our facility. The patient also has the right and is explained the right to defer or refuse any part of the evaluation or treatment including the internal exam. With the patient's  consent, PT will use one gloved finger to gently assess the muscles of the pelvic floor, seeing how well it contracts and relaxes and if there is muscle symmetry. After, the patient will get dressed and PT and patient will discuss exam findings and plan of care. PT and patient discuss plan of care, schedule, attendance policy and HEP activities.   PELVIC MMT:   MMT eval  Vaginal 1/5 with gluteal contraction  (Blank rows = not tested)        TONE: Average tone of pelvic floor  PROLAPSE: Posterior wall weakness just above the introitus; anterior wall weakness just above the introitus  TODAY'S TREATMENT:                                                                                                                              DATE: 09/30/23  EVAL Examination completed, findings reviewed, pt educated on POC, HEP, and female pelvic floor anatomy, reasoning with pelvic floor assessment internally with pt consent. Pt motivated to participate in PT and agreeable to attempt  recommendations.     PATIENT EDUCATION:  09/30/23 Education details: educated patient on how to reset her prolapse with laying down and legs elevated; educated patient on what a prolapse is Person educated: Patient Education method: Programmer, multimedia, Demonstration, Actor cues, Verbal cues, and Handouts Education comprehension: verbalized understanding, returned demonstration, verbal cues required, tactile cues required, and needs further education  HOME EXERCISE PROGRAM: See above.   ASSESSMENT:  CLINICAL IMPRESSION: Patient is a 63 y.o. female who was seen today for physical therapy evaluation and treatment for rectocele and urinary leakage. Patient reports her symptoms have become progressively worse this year. She reports urinate leakage with  urge to void, walking to the bathroom, and coughing. She will have to urinate a second time after urination. She has urgency and has to rush to the bathroom after she has the urge to urinate. Pelvic floor strength is 1/5 and uses her gluteals to compensate. She has difficulty with contracting her abdominals. She has restrictions along the perineal body where she tore with a third degree tear during vaginal birth. Her posture consists of increased thoracic kyphosis, posterior pelvic tilt, forward head and rounded shoulders. Anterior wall weakness and posterior wall weakness just above the introitus. Patient will benefit from skilled therapy to improve pelvic floor strength and coordination to reduce leakage, urgency and manage her prolapse.   OBJECTIVE IMPAIRMENTS: decreased coordination, decreased ROM, decreased strength, increased fascial restrictions, and postural dysfunction.   ACTIVITY LIMITATIONS: continence and toileting  PARTICIPATION LIMITATIONS: cleaning, laundry, shopping, and community activity  PERSONAL FACTORS: Time since onset of injury/illness/exacerbation and 1-2 comorbidities: Hypertension; Myomectomy with abdominal approach; Abdominal  Hysterctomy are also affecting patient's functional outcome.   REHAB POTENTIAL: Excellent  CLINICAL DECISION MAKING: Evolving/moderate complexity  EVALUATION COMPLEXITY: Moderate   GOALS: Goals reviewed with patient? Yes  SHORT TERM GOALS: Target date: 10/28/23  Patient is independent with her initial HEP to  improve continence.  Baseline: Goal status: INITIAL  2.  Patient is able to perform diaphragmatic breathing to elongate the pelvic floor for urgency.  Baseline:  Goal status: INITIAL  3.  Patient educated on the urge to void to reduce urgency when in pubic.  Baseline:  Goal status: INITIAL  4.  Patient educated on what a prolapse is understand how to lay down with legs elevated to reduce prolapse during middle of day and after strenuous activity.  Baseline:  Goal status: INITIAL   LONG TERM GOALS: Target date: 03/02/23  Patient independent with advanced HEP for core and pelvic floor to reduce urinary leakage and urgency.  Baseline:  Goal status: INITIAL  2.  Patient educated on pressure management during daily activities to reduce strain on the prolapse and prevent further progression.  Baseline:  Goal status: INITIAL  3.  Patient is able to have the urge to urinate and slowly work to the bathroom without leaking urine due to using her urge suppression techniques.  Baseline:  Goal status: INITIAL  4.  Patient is able to cough without leaking urine due to the ability to quickly contract the pelvic floor and pelvic floor strength is >/= 3/5.  Baseline:  Goal status: INITIAL   PLAN:  PT FREQUENCY: 1-2x/week  PT DURATION: other: 5 months  PLANNED INTERVENTIONS: 97110-Therapeutic exercises, 97530- Therapeutic activity, 97112- Neuromuscular re-education, [id number]- Self Care, [id number]- Manual therapy, [id number]- Electrical stimulation (unattended), [phone] (1-2 muscles), [id number] (3+ muscles)- Dry Needling, Patient/Family education, Joint mobilization, Spinal mobilization,  Cryotherapy, Moist heat, and Biofeedback  PLAN FOR NEXT SESSION: diaphragmatic breathing, abdominal contraction, electrical stimulation to pelvic floor to work on contraction, manual work to perineal body and posterior vaginal canal   Channing Pereyra, PT 09/30/23 11:15 AM

## 2023-10-01 ENCOUNTER — Encounter: Payer: Self-pay | Admitting: Obstetrics

## 2023-10-01 DIAGNOSIS — N3946 Mixed incontinence: Secondary | ICD-10-CM

## 2023-10-05 ENCOUNTER — Encounter: Payer: Self-pay | Admitting: Physical Therapy

## 2023-10-05 ENCOUNTER — Ambulatory Visit: Admitting: Physical Therapy

## 2023-10-05 DIAGNOSIS — M6281 Muscle weakness (generalized): Secondary | ICD-10-CM

## 2023-10-05 DIAGNOSIS — R278 Other lack of coordination: Secondary | ICD-10-CM | POA: Diagnosis not present

## 2023-10-05 MED ORDER — MIRABEGRON ER 25 MG PO TB24: 25.0000 mg | ORAL_TABLET | Freq: Every day | ORAL | 0 refills | Status: DC

## 2023-10-05 NOTE — Therapy (Signed)
 OUTPATIENT PHYSICAL THERAPY FEMALE PELVIC TREATMENT   Patient Name: Olivia Carr MRN: [id number] DOB:09/15/1960, 63 y.o., female Today's Date: 10/05/2023  END OF SESSION:  PT End of Session - 10/05/23 1402     Visit Number 2    Date for PT Re-Evaluation 03/01/24    Authorization Type [employer]    PT Start Time 1400    PT Stop Time 1440    PT Time Calculation (min) 40 min    Activity Tolerance Patient tolerated treatment well    Behavior During Therapy WFL for tasks assessed/performed          Past Medical History:  Diagnosis Date   Allergic rhinitis    sees Dr. Frutoso    Asthma    sees Dr. Frutoso    Benign tumor of pineal gland (HCC)     last MRI 10-10-09, stable, no follow up planned   DUB (dysfunctional uterine bleeding)    GERD (gastroesophageal reflux disease)    History of bone density study 03-22-13   normal    Hypertension    NSVD (normal spontaneous vaginal delivery)    X2   Pneumonia    Routine gynecological examination    sees Dr. Curlee Guan    Past Surgical History:  Procedure Laterality Date   ABDOMINAL HYSTERECTOMY     LAVH   ANTERIOR CRUCIATE LIGAMENT REPAIR     right knee with medial and lateral meniscectomies 09/04/08   COLONOSCOPY  05/06/2021   per Dr. Julianne Gwen), benign polyps, repeat in 5 yrs   COLONOSCOPY  2025   DILATION AND CURETTAGE OF UTERUS     x 2   EUSTACHIAN TUBE DILATION     KNEE ARTHROSCOPY Left 2018   LIGAMENT REPAIR Left 08/24/2019   Procedure: LIGAMENT REPAIR;  Surgeon: Cristy Bonner DASEN, MD;  Location: [facility];  Service: Orthopedics;  Laterality: Left;   MYOMECTOMY ABDOMINAL APPROACH     RADIAL HEAD ARTHROPLASTY Left 08/24/2019   Procedure: RADIAL HEAD ARTHROPLASTY;  Surgeon: Cristy Bonner DASEN, MD;  Location: [facility];  Service: Orthopedics;  Laterality: Left;   TONSILLECTOMY     WISDOM TOOTH EXTRACTION     Patient Active Problem List   Diagnosis Date Noted   Urinary incontinence,  mixed 09/25/2023   Pelvic organ prolapse quantification stage 2 rectocele 09/25/2023   Vaginal atrophy 09/25/2023   Incontinence of feces 09/25/2023   Nocturia 09/25/2023   History of recurrent UTI (urinary tract infection) 09/25/2023   COVID-19 virus infection 02/06/2020   SI (sacroiliac) pain 03/03/2014   Anxiety 01/19/2013   Perimenopause 01/19/2012   Acute upper respiratory infection 04/06/2010   Asthma with exacerbation 04/06/2010   BENIGN NEOPLASM OF PINEAL GLAND 09/24/2009   GASTROENTERITIS 07/13/2009   SORE THROAT 07/05/2009   FEVER UNSPECIFIED 07/05/2009   ASTHMA 01/22/2008   HYPERTENSION 10/12/2006   ALLERGIC RHINITIS 10/12/2006   GERD 10/08/2006   PNEUMONIA, HX OF 10/08/2006    PCP: Johnny garnette LABOR, MD  REFERRING PROVIDER: Guadlupe Lianne DASEN, MD   REFERRING DIAG:  N39.46 (ICD-10-CM) - Urinary incontinence, mixed  N81.6 (ICD-10-CM) - Pelvic organ prolapse quantification stage 2 rectocele  R15.9 (ICD-10-CM) - Incontinence of feces, unspecified fecal incontinence type    THERAPY DIAG:  Other lack of coordination  Muscle weakness (generalized)  Rationale for Evaluation and Treatment: Rehabilitation  ONSET DATE: 2025  SUBJECTIVE:  SUBJECTIVE STATEMENT: I am feeling more informed. I am on my second week of [place]. I have slept through the night 1 night.  Fluid intake: water, soda, coffee, juice  PAIN:  Are you having pain? No  PRECAUTIONS: None  RED FLAGS: None   WEIGHT BEARING RESTRICTIONS: No  FALLS:  Has patient fallen in last 6 months? No  OCCUPATION: retired  ACTIVITY LEVEL : gym 2 days per week for full body workout, walking 1-2 times per week  PLOF: Independent  PATIENT GOALS: reduce leakage, manage prolapse, reduce frequency  PERTINENT HISTORY:  Hypertension;  Myomectomy with abdominal approach; Abdominal Hysterctomy Sexual abuse: No  BOWEL MOVEMENT: Pain with bowel movement: No Type of bowel movement:Strain none Fully empty rectum: Yes: goes multiple times per day   URINATION: Pain with urination: No Fully empty bladder: Yes: due to having to go several times, stand up then has to urinate Stream: Strong Urgency: Yes , has to gt to the bathroom quickly Frequency: day-10 times; night- 2 times Leakage: Urge to void, Walking to the bathroom, and Coughing Pads: Yes: 1 per day for security  INTERCOURSE:  Ability to have vaginal penetration Yes  Pain with intercourse: none  PREGNANCY: Vaginal deliveries 2 Tearing Yes: 3rd degree tear  PROLAPSE: Pressure and Bulge   OBJECTIVE:  Note: Objective measures were completed at Evaluation unless otherwise noted.  DIAGNOSTIC FINDINGS:  none  PATIENT SURVEYS:  PFIQ-7: 33 UIQ-7 33  COGNITION: Overall cognitive status: Within functional limits for tasks assessed     SENSATION: Light touch:    FUNCTIONAL TESTS:  Single leg stance : right 3 sec, left 4 sec   POSTURE: rounded shoulders, forward head, increased thoracic kyphosis, and posterior pelvic tilt   LUMBARAROM/PROM:  A/PROM A/PROM  eval  Flexion full  Extension Decreased by 25%  Right lateral flexion Decreased by 25%  Left lateral flexion Decreased by 25%  Right rotation Decreased by 25%  Left rotation Decreased by 25%   (Blank rows = not tested)  LOWER EXTREMITY ROM:  Passive ROM Right eval Left eval  Hip internal rotation 20 0   (Blank rows = not tested)  LOWER EXTREMITY MMT:  MMT Right eval Left eval  Hip flexion 5/5 4/5  Hip extension 4/5 4/5  Hip abduction 4/5 4/5   (Blank rows = not tested) PALPATION:  Pelvic Alignment: ASIS are equal  Abdominal: difficulty with contracting the abdominals                External Perineal Exam: scar along the perineal body is restricted                               Internal Pelvic Floor: tightness along the posterior vaginal canal  Patient confirms identification and approves PT to assess internal pelvic floor and treatment Yes No emotional/communication barriers or cognitive limitation. Patient is motivated to learn. Patient understands and agrees with treatment goals and plan. PT explains patient will be examined in standing, sitting, and lying down to see how their muscles and joints work. When they are ready, they will be asked to remove their underwear so PT can examine their perineum. The patient is also given the option of providing their own chaperone as one is not provided in our facility. The patient also has the right and is explained the right to defer or refuse any part of the evaluation or treatment including the internal exam. With the patient's  consent, PT will use one gloved finger to gently assess the muscles of the pelvic floor, seeing how well it contracts and relaxes and if there is muscle symmetry. After, the patient will get dressed and PT and patient will discuss exam findings and plan of care. PT and patient discuss plan of care, schedule, attendance policy and HEP activities.   PELVIC MMT:   MMT eval  Vaginal 1/5 with gluteal contraction  (Blank rows = not tested)        TONE: Average tone of pelvic floor  PROLAPSE: Posterior wall weakness just above the introitus; anterior wall weakness just above the introitus  TODAY'S TREATMENT:   10/05/23 Neuromuscular re-education: Pelvic floor contraction training: [place] stimulation to the pelvic floor 5 sec on and off, 50% duty cycle, 2 sec ramp up with pelvic floor contraction in supine when on, then with hip adduction with pelvic floor contraction, small heel slides with pelvic floor contraction and hip adduction resistance.  No stimulation with bridge with ball squeeze with pelvic floor contraction Hip adduction with pelvic floor contraction 15 x each side with pelvic floor  contraction                                                                                                                               DATE: 09/30/23  EVAL Examination completed, findings reviewed, pt educated on POC, HEP, and female pelvic floor anatomy, reasoning with pelvic floor assessment internally with pt consent. Pt motivated to participate in PT and agreeable to attempt recommendations.     PATIENT EDUCATION:  10/05/23 Education details: Access Code: [email or web address] Person educated: Patient Education method: Explanation, Demonstration, Actor cues, Verbal cues, and Handouts Education comprehension: verbalized understanding, returned demonstration, verbal cues required, tactile cues required, and needs further education  HOME EXERCISE PROGRAM: 10/05/23 Access Code: [email or web address] URL: [email or web address] Date: 10/05/2023 Prepared by: Channing Pereyra  Exercises - Pelvic Floor Contractions in [place] with Adduction  - 1 x daily - 7 x weekly - 1 sets - 10 reps - Supine Bridge with Mini Swiss Ball Between Knees  - 1 x daily - 7 x weekly - 1 sets - 10 reps - Sidelying Hip Adduction  - 1 x daily - 7 x weekly - 1 sets - 10 reps - Supine Heel Slide with Small Ball  - 1 x daily - 7 x weekly - 1 sets - 10 reps   ASSESSMENT:  CLINICAL IMPRESSION: Patient is a 63 y.o. female who was seen today for physical therapy  treatment for rectocele and urinary leakage. Patient was able to feel the pelvic floor contraction for the first time by the end of the treatment. She is understanding how to breath out with pelvic floor contraction.  Patient will benefit from skilled therapy to improve pelvic floor strength and coordination to reduce leakage, urgency and manage her prolapse.   OBJECTIVE IMPAIRMENTS: decreased coordination, decreased ROM, decreased strength, increased fascial restrictions, and postural dysfunction.  ACTIVITY LIMITATIONS: continence and toileting  PARTICIPATION LIMITATIONS:  cleaning, laundry, shopping, and community activity  PERSONAL FACTORS: Time since onset of injury/illness/exacerbation and 1-2 comorbidities: Hypertension; Myomectomy with abdominal approach; Abdominal Hysterctomy are also affecting patient's functional outcome.   REHAB POTENTIAL: Excellent  CLINICAL DECISION MAKING: Evolving/moderate complexity  EVALUATION COMPLEXITY: Moderate   GOALS: Goals reviewed with patient? Yes  SHORT TERM GOALS: Target date: 10/28/23  Patient is independent with her initial HEP to improve continence.  Baseline: Goal status: INITIAL  2.  Patient is able to perform diaphragmatic breathing to elongate the pelvic floor for urgency.  Baseline:  Goal status: INITIAL  3.  Patient educated on the urge to void to reduce urgency when in pubic.  Baseline:  Goal status: INITIAL  4.  Patient educated on what a prolapse is understand how to lay down with legs elevated to reduce prolapse during middle of day and after strenuous activity.  Baseline:  Goal status: INITIAL   LONG TERM GOALS: Target date: 03/02/23  Patient independent with advanced HEP for core and pelvic floor to reduce urinary leakage and urgency.  Baseline:  Goal status: INITIAL  2.  Patient educated on pressure management during daily activities to reduce strain on the prolapse and prevent further progression.  Baseline:  Goal status: INITIAL  3.  Patient is able to have the urge to urinate and slowly work to the bathroom without leaking urine due to using her urge suppression techniques.  Baseline:  Goal status: INITIAL  4.  Patient is able to cough without leaking urine due to the ability to quickly contract the pelvic floor and pelvic floor strength is >/= 3/5.  Baseline:  Goal status: INITIAL   PLAN:  PT FREQUENCY: 1-2x/week  PT DURATION: other: 5 months  PLANNED INTERVENTIONS: 97110-Therapeutic exercises, 97530- Therapeutic activity, 97112- Neuromuscular re-education, [phone]- Self  Care, [id number]- Manual therapy, [id number]- Electrical stimulation (unattended), [phone] (1-2 muscles), [phone] (3+ muscles)- Dry Needling, Patient/Family education, Joint mobilization, Spinal mobilization, Cryotherapy, Moist heat, and Biofeedback  PLAN FOR NEXT SESSION: diaphragmatic breathing, abdominal contraction, electrical stimulation to pelvic floor to work on contraction, manual work to perineal body and posterior vaginal canal   Channing Pereyra, PT 10/05/23 2:45 PM

## 2023-10-09 ENCOUNTER — Ambulatory Visit: Admitting: Physical Therapy

## 2023-10-09 ENCOUNTER — Encounter: Payer: Self-pay | Admitting: Physical Therapy

## 2023-10-09 DIAGNOSIS — M6281 Muscle weakness (generalized): Secondary | ICD-10-CM

## 2023-10-09 DIAGNOSIS — R278 Other lack of coordination: Secondary | ICD-10-CM

## 2023-10-09 NOTE — Therapy (Signed)
 OUTPATIENT PHYSICAL THERAPY FEMALE PELVIC TREATMENT   Patient Name: Olivia Carr MRN: [id number] DOB:August 04, 1960, 63 y.o., female Today's Date: 10/09/2023  END OF SESSION:  PT End of Session - 10/09/23 1102     Visit Number 3    Date for PT Re-Evaluation 03/01/24    Authorization Type [employer]    PT Start Time 1100    PT Stop Time 1140    PT Time Calculation (min) 40 min    Activity Tolerance Patient tolerated treatment well    Behavior During Therapy WFL for tasks assessed/performed          Past Medical History:  Diagnosis Date   Allergic rhinitis    sees Dr. Frutoso    Asthma    sees Dr. Frutoso    Benign tumor of pineal gland (HCC)     last MRI 10-10-09, stable, no follow up planned   DUB (dysfunctional uterine bleeding)    GERD (gastroesophageal reflux disease)    History of bone density study 03-22-13   normal    Hypertension    NSVD (normal spontaneous vaginal delivery)    X2   Pneumonia    Routine gynecological examination    sees Dr. Curlee Guan    Past Surgical History:  Procedure Laterality Date   ABDOMINAL HYSTERECTOMY     LAVH   ANTERIOR CRUCIATE LIGAMENT REPAIR     right knee with medial and lateral meniscectomies 09/04/08   COLONOSCOPY  05/06/2021   per Dr. Julianne Gwen), benign polyps, repeat in 5 yrs   COLONOSCOPY  2025   DILATION AND CURETTAGE OF UTERUS     x 2   EUSTACHIAN TUBE DILATION     KNEE ARTHROSCOPY Left 2018   LIGAMENT REPAIR Left 08/24/2019   Procedure: LIGAMENT REPAIR;  Surgeon: Cristy Bonner DASEN, MD;  Location: [facility];  Service: Orthopedics;  Laterality: Left;   MYOMECTOMY ABDOMINAL APPROACH     RADIAL HEAD ARTHROPLASTY Left 08/24/2019   Procedure: RADIAL HEAD ARTHROPLASTY;  Surgeon: Cristy Bonner DASEN, MD;  Location: [facility];  Service: Orthopedics;  Laterality: Left;   TONSILLECTOMY     WISDOM TOOTH EXTRACTION     Patient Active Problem List   Diagnosis Date Noted   Urinary  incontinence, mixed 09/25/2023   Pelvic organ prolapse quantification stage 2 rectocele 09/25/2023   Vaginal atrophy 09/25/2023   Incontinence of feces 09/25/2023   Nocturia 09/25/2023   History of recurrent UTI (urinary tract infection) 09/25/2023   COVID-19 virus infection 02/06/2020   SI (sacroiliac) pain 03/03/2014   Anxiety 01/19/2013   Perimenopause 01/19/2012   Acute upper respiratory infection 04/06/2010   Asthma with exacerbation 04/06/2010   BENIGN NEOPLASM OF PINEAL GLAND 09/24/2009   GASTROENTERITIS 07/13/2009   SORE THROAT 07/05/2009   FEVER UNSPECIFIED 07/05/2009   ASTHMA 01/22/2008   HYPERTENSION 10/12/2006   ALLERGIC RHINITIS 10/12/2006   GERD 10/08/2006   PNEUMONIA, HX OF 10/08/2006    PCP: Johnny garnette LABOR, MD  REFERRING PROVIDER: Guadlupe Lianne DASEN, MD   REFERRING DIAG:  N39.46 (ICD-10-CM) - Urinary incontinence, mixed  N81.6 (ICD-10-CM) - Pelvic organ prolapse quantification stage 2 rectocele  R15.9 (ICD-10-CM) - Incontinence of feces, unspecified fecal incontinence type    THERAPY DIAG:  Other lack of coordination  Muscle weakness (generalized)  Rationale for Evaluation and Treatment: Rehabilitation  ONSET DATE: 2025  SUBJECTIVE:  SUBJECTIVE STATEMENT: I may think I know where the muscle is.  Fluid intake: water, soda, coffee, juice  PAIN:  Are you having pain? No  PRECAUTIONS: None  RED FLAGS: None   WEIGHT BEARING RESTRICTIONS: No  FALLS:  Has patient fallen in last 6 months? No  OCCUPATION: retired  ACTIVITY LEVEL : gym 2 days per week for full body workout, walking 1-2 times per week  PLOF: Independent  PATIENT GOALS: reduce leakage, manage prolapse, reduce frequency  PERTINENT HISTORY:  Hypertension; Myomectomy with abdominal approach; Abdominal  Hysterctomy Sexual abuse: No  BOWEL MOVEMENT: Pain with bowel movement: No Type of bowel movement:Strain none Fully empty rectum: Yes: goes multiple times per day   URINATION: Pain with urination: No Fully empty bladder: Yes: due to having to go several times, stand up then has to urinate Stream: Strong Urgency: Yes , has to gt to the bathroom quickly Frequency: day-10 times; night- 2 times Leakage: Urge to void, Walking to the bathroom, and Coughing Pads: Yes: 1 per day for security  INTERCOURSE:  Ability to have vaginal penetration Yes  Pain with intercourse: none  PREGNANCY: Vaginal deliveries 2 Tearing Yes: 3rd degree tear  PROLAPSE: Pressure and Bulge   OBJECTIVE:  Note: Objective measures were completed at Evaluation unless otherwise noted.  DIAGNOSTIC FINDINGS:  none  PATIENT SURVEYS:  PFIQ-7: 33 UIQ-7 33  COGNITION: Overall cognitive status: Within functional limits for tasks assessed     SENSATION: Light touch:    FUNCTIONAL TESTS:  Single leg stance : right 3 sec, left 4 sec   POSTURE: rounded shoulders, forward head, increased thoracic kyphosis, and posterior pelvic tilt   LUMBARAROM/PROM:  A/PROM A/PROM  eval  Flexion full  Extension Decreased by 25%  Right lateral flexion Decreased by 25%  Left lateral flexion Decreased by 25%  Right rotation Decreased by 25%  Left rotation Decreased by 25%   (Blank rows = not tested)  LOWER EXTREMITY ROM:  Passive ROM Right eval Left eval  Hip internal rotation 20 0   (Blank rows = not tested)  LOWER EXTREMITY MMT:  MMT Right eval Left eval  Hip flexion 5/5 4/5  Hip extension 4/5 4/5  Hip abduction 4/5 4/5   (Blank rows = not tested) PALPATION:  Pelvic Alignment: ASIS are equal  Abdominal: difficulty with contracting the abdominals                External Perineal Exam: scar along the perineal body is restricted                              Internal Pelvic Floor: tightness along  the posterior vaginal canal  Patient confirms identification and approves PT to assess internal pelvic floor and treatment Yes No emotional/communication barriers or cognitive limitation. Patient is motivated to learn. Patient understands and agrees with treatment goals and plan. PT explains patient will be examined in standing, sitting, and lying down to see how their muscles and joints work. When they are ready, they will be asked to remove their underwear so PT can examine their perineum. The patient is also given the option of providing their own chaperone as one is not provided in our facility. The patient also has the right and is explained the right to defer or refuse any part of the evaluation or treatment including the internal exam. With the patient's consent, PT will use one gloved finger to gently assess the muscles  of the pelvic floor, seeing how well it contracts and relaxes and if there is muscle symmetry. After, the patient will get dressed and PT and patient will discuss exam findings and plan of care. PT and patient discuss plan of care, schedule, attendance policy and HEP activities.   PELVIC MMT:   MMT eval 10/09/23  Vaginal 1/5 with gluteal contraction 2/5  (Blank rows = not tested)        TONE: Average tone of pelvic floor  PROLAPSE: Posterior wall weakness just above the introitus; anterior wall weakness just above the introitus  TODAY'S TREATMENT:   10/09/23 Manual: Soft tissue mobilization: Manual release of the perineal body to improve pelvic floor contraction Internal pelvic floor techniques: No emotional/communication barriers or cognitive limitation. Patient is motivated to learn. Patient understands and agrees with treatment goals and plan. PT explains patient will be examined in standing, sitting, and lying down to see how their muscles and joints work. When they are ready, they will be asked to remove their underwear so PT can examine their perineum. The patient  is also given the option of providing their own chaperone as one is not provided in our facility. The patient also has the right and is explained the right to defer or refuse any part of the evaluation or treatment including the internal exam. With the patient's consent, PT will use one gloved finger to gently assess the muscles of the pelvic floor, seeing how well it contracts and relaxes and if there is muscle symmetry. After, the patient will get dressed and PT and patient will discuss exam findings and plan of care. PT and patient discuss plan of care, schedule, attendance policy and HEP activities.  Therapist using a gloved finger in the vaginal canal working on the posterior vaginal canal, iliococcygeus, transverse perineum to elongate the tissue to improve contraction Neuromuscular re-education: Pelvic floor contraction training: Standing hip adduction with pelvic floor contraction 10x each Press hand into thigh with pelvic floor contraction and abdominal contraction 10 x Sitting pelvic floor contraction 10 x Supine with therapist finger in the vaginal canal to give patient tactile cues to the pelvic floor    10/05/23 Neuromuscular re-education: Pelvic floor contraction training: [place] stimulation to the pelvic floor 5 sec on and off, 50% duty cycle, 2 sec ramp up with pelvic floor contraction in supine when on, then with hip adduction with pelvic floor contraction, small heel slides with pelvic floor contraction and hip adduction resistance.  No stimulation with bridge with ball squeeze with pelvic floor contraction Hip adduction with pelvic floor contraction 15 x each side with pelvic floor contraction                                                                                                                               DATE: 09/30/23  EVAL Examination completed, findings reviewed, pt educated on POC, HEP, and female pelvic floor anatomy, reasoning with pelvic floor assessment  internally with pt consent. Pt motivated to participate in PT and agreeable to attempt recommendations.     PATIENT EDUCATION:  10/09/23 Education details: Access Code: [email or web address] Person educated: Patient Education method: Explanation, Demonstration, Actor cues, Verbal cues, and Handouts Education comprehension: verbalized understanding, returned demonstration, verbal cues required, tactile cues required, and needs further education  HOME EXERCISE PROGRAM: 10/09/23 Access Code: [email or web address] URL: [email or web address] Date: 10/09/2023 Prepared by: Channing Pereyra  Exercises - - Supine Heel Slide with Small Ball  - 1 x daily - 7 x weekly - 3 sets - 10 reps - Supine Bridge with Mini Swiss Ball Between Knees  - 1 x daily - 1 x weekly - 1 sets - 10 reps - Sidelying Hip Adduction  - 1 x daily - 3 x weekly - 1 sets - 10 reps - Standing Hip Adduction  - 1 x daily - 3 x weekly - 1 sets - 10 reps - Seated Abdominal Press into [employer]  - 1 x daily - 3 x weekly - 1 sets - 10 reps    ASSESSMENT:  CLINICAL IMPRESSION: Patient is a 63 y.o. female who was seen today for physical therapy  treatment for rectocele and urinary leakage. Pelvic floor strength is 2/5. Patient is able to feel the pelvic floor contraction. Patient is learning how to contract the pelvic floor with breathing out.  Patient will benefit from skilled therapy to improve pelvic floor strength and coordination to reduce leakage, urgency and manage her prolapse.   OBJECTIVE IMPAIRMENTS: decreased coordination, decreased ROM, decreased strength, increased fascial restrictions, and postural dysfunction.   ACTIVITY LIMITATIONS: continence and toileting  PARTICIPATION LIMITATIONS: cleaning, laundry, shopping, and community activity  PERSONAL FACTORS: Time since onset of injury/illness/exacerbation and 1-2 comorbidities: Hypertension; Myomectomy with abdominal approach; Abdominal Hysterctomy are also affecting patient's  functional outcome.   REHAB POTENTIAL: Excellent  CLINICAL DECISION MAKING: Evolving/moderate complexity  EVALUATION COMPLEXITY: Moderate   GOALS: Goals reviewed with patient? Yes  SHORT TERM GOALS: Target date: 10/28/23  Patient is independent with her initial HEP to improve continence.  Baseline: Goal status: Met 10/09/23  2.  Patient is able to perform diaphragmatic breathing to elongate the pelvic floor for urgency.  Baseline:  Goal status: INITIAL  3.  Patient educated on the urge to void to reduce urgency when in pubic.  Baseline:  Goal status: INITIAL  4.  Patient educated on what a prolapse is understand how to lay down with legs elevated to reduce prolapse during middle of day and after strenuous activity.  Baseline:  Goal status: INITIAL   LONG TERM GOALS: Target date: 03/02/23  Patient independent with advanced HEP for core and pelvic floor to reduce urinary leakage and urgency.  Baseline:  Goal status: INITIAL  2.  Patient educated on pressure management during daily activities to reduce strain on the prolapse and prevent further progression.  Baseline:  Goal status: INITIAL  3.  Patient is able to have the urge to urinate and slowly work to the bathroom without leaking urine due to using her urge suppression techniques.  Baseline:  Goal status: INITIAL  4.  Patient is able to cough without leaking urine due to the ability to quickly contract the pelvic floor and pelvic floor strength is >/= 3/5.  Baseline:  Goal status: INITIAL   PLAN:  PT FREQUENCY: 1-2x/week  PT DURATION: other: 5 months  PLANNED INTERVENTIONS: 97110-Therapeutic exercises, 97530- Therapeutic activity, [id number]- Neuromuscular re-education, 97535- Self Care, [id number]- Manual therapy, [id number]- Electrical stimulation (  unattended), 20560 (1-2 muscles), 20561 (3+ muscles)- Dry Needling, Patient/Family education, Joint mobilization, Spinal mobilization, Cryotherapy, Moist heat, and  Biofeedback  PLAN FOR NEXT SESSION: diaphragmatic breathing, abdominal contraction, prolapse management  Channing Pereyra, PT 10/09/23 11:48 AM

## 2023-10-12 ENCOUNTER — Telehealth: Payer: Self-pay

## 2023-10-12 NOTE — Telephone Encounter (Signed)
 Copied from CRM #[id number]. Topic: Clinical - Medication Question >> Oct 12, 2023 10:40 AM Treva T wrote: Reason for CRM: Received call from patient, states she is currently traveling internationally, and doe not have her blood pressure medication with her.   Patient is inquiring if provider can prescribe a temporary refill internationally, or does she need to seek medical advice/attention of where she is currently internationally. She denies any current symptoms at the time of call.   Reports [place]. She has back up medications for only a couple days, which she will be without until she returns, after she take those she has.  Medication:  metoprolol  succinate (TOPROL -XL) 25 MG 24 hr tablet  Patient is currently is in [place], but will be leaving tomorrow, but still can be reached at [phone].  Patient is aware of same day call back.

## 2023-10-12 NOTE — Telephone Encounter (Signed)
 Spoke with pt advised that Dr Johnny is not able to prescribe Rx overseas, pt advised to go to a local UC to get a prescription to last her through her return home on 10/22/23, pt voiced understanding

## 2023-10-14 ENCOUNTER — Telehealth: Payer: Self-pay

## 2023-10-14 NOTE — Telephone Encounter (Signed)
 Patient called stating she's still taking gemtesa  samples but she will be running out soon and she's going out of the country. She has gotten approved to start Mirabegron  but she doesn't want to start a new medication while being out of the country and she requested more samples to last her until she comes back. Olivia Carr said she will start her mirabegron  when she gets home.  Addendum: Patient has picked up samples from the office.

## 2023-10-26 ENCOUNTER — Telehealth: Payer: Self-pay | Admitting: Family Medicine

## 2023-10-26 NOTE — Telephone Encounter (Unsigned)
 Copied from CRM [id number]. Topic: Clinical - Medication Question >> Oct 26, 2023  3:01 PM Rea C wrote: Reason for CRM: Patient needs a guidance on a new medication that was prescribed by your uro-gynecologist, Dr. Talitha. Patient is concerned about the prescription interacting with her blood pressure medication. Patient did try to submit an inquiry in MyChart but it didn't allow her to do it.   Gentesa (new script) patient did 4 weeks of samples. However, her insurance does not cover that medication. So, they would need to appeal if they want her to stay on it.   Mirabegron  ER is the generic drug/best alternative.  The potassium chloride  and metoprolol  may have some interactions with the medication that her other physician provided. And, she doesn't want to switch over without consulting Dr. Johnny. Patient said if she needs to schedule an appointment, please let her know.   [phone] (M) - Patient would also like a message in MyChart if she is not reached by phone as a back up.

## 2023-10-28 NOTE — Telephone Encounter (Signed)
 Spoke with Olivia Carr regarding Dr Johnny advise, voiced understanding

## 2023-10-28 NOTE — Telephone Encounter (Signed)
 That should be fine.  I don't think her BP would be affected much

## 2023-11-10 ENCOUNTER — Other Ambulatory Visit: Payer: Self-pay | Admitting: Obstetrics

## 2023-11-10 DIAGNOSIS — N3946 Mixed incontinence: Secondary | ICD-10-CM

## 2023-11-18 ENCOUNTER — Ambulatory Visit: Attending: Obstetrics | Admitting: Physical Therapy

## 2023-11-18 ENCOUNTER — Encounter: Payer: Self-pay | Admitting: Physical Therapy

## 2023-11-18 DIAGNOSIS — M6281 Muscle weakness (generalized): Secondary | ICD-10-CM | POA: Insufficient documentation

## 2023-11-18 DIAGNOSIS — R278 Other lack of coordination: Secondary | ICD-10-CM | POA: Insufficient documentation

## 2023-11-18 NOTE — Therapy (Signed)
 OUTPATIENT PHYSICAL THERAPY FEMALE PELVIC TREATMENT   Patient Name: Olivia Carr MRN: [id number] DOB:[birth year], 63 y.o., female Today's Date: 11/18/2023  END OF SESSION:  PT End of Session - 11/18/23 1402     Visit Number 4    Date for Recertification  03/01/24    Authorization Type [employer]    PT Start Time 1400    PT Stop Time 1440    PT Time Calculation (min) 40 min    Activity Tolerance Patient tolerated treatment well    Behavior During Therapy WFL for tasks assessed/performed          Past Medical History:  Diagnosis Date   Allergic rhinitis    sees Dr. Frutoso    Asthma    sees Dr. Frutoso    Benign tumor of pineal gland (HCC)     last MRI 10-10-09, stable, no follow up planned   DUB (dysfunctional uterine bleeding)    GERD (gastroesophageal reflux disease)    History of bone density study 03-22-13   normal    Hypertension    NSVD (normal spontaneous vaginal delivery)    X2   Pneumonia    Routine gynecological examination    sees Dr. Curlee Guan    Past Surgical History:  Procedure Laterality Date   ABDOMINAL HYSTERECTOMY     LAVH   ANTERIOR CRUCIATE LIGAMENT REPAIR     right knee with medial and lateral meniscectomies 09/04/08   COLONOSCOPY  05/06/2021   per Dr. Julianne Gwen), benign polyps, repeat in 5 yrs   COLONOSCOPY  2025   DILATION AND CURETTAGE OF UTERUS     x 2   EUSTACHIAN TUBE DILATION     KNEE ARTHROSCOPY Left 2018   LIGAMENT REPAIR Left 08/24/2019   Procedure: LIGAMENT REPAIR;  Surgeon: Cristy Bonner DASEN, MD;  Location: [facility];  Service: Orthopedics;  Laterality: Left;   MYOMECTOMY ABDOMINAL APPROACH     RADIAL HEAD ARTHROPLASTY Left 08/24/2019   Procedure: RADIAL HEAD ARTHROPLASTY;  Surgeon: Cristy Bonner DASEN, MD;  Location: [facility];  Service: Orthopedics;  Laterality: Left;   TONSILLECTOMY     WISDOM TOOTH EXTRACTION     Patient Active Problem List   Diagnosis Date Noted   Urinary  incontinence, mixed 09/25/2023   Pelvic organ prolapse quantification stage 2 rectocele 09/25/2023   Vaginal atrophy 09/25/2023   Incontinence of feces 09/25/2023   Nocturia 09/25/2023   History of recurrent UTI (urinary tract infection) 09/25/2023   COVID-19 virus infection 02/06/2020   SI (sacroiliac) pain 03/03/2014   Anxiety 01/19/2013   Perimenopause 01/19/2012   Acute upper respiratory infection 04/06/2010   Asthma with exacerbation 04/06/2010   BENIGN NEOPLASM OF PINEAL GLAND 09/24/2009   GASTROENTERITIS 07/13/2009   SORE THROAT 07/05/2009   FEVER UNSPECIFIED 07/05/2009   ASTHMA 01/22/2008   HYPERTENSION 10/12/2006   ALLERGIC RHINITIS 10/12/2006   GERD 10/08/2006   PNEUMONIA, HX OF 10/08/2006    PCP: Johnny garnette LABOR, MD  REFERRING PROVIDER: Guadlupe Lianne DASEN, MD   REFERRING DIAG:  N39.46 (ICD-10-CM) - Urinary incontinence, mixed  N81.6 (ICD-10-CM) - Pelvic organ prolapse quantification stage 2 rectocele  R15.9 (ICD-10-CM) - Incontinence of feces, unspecified fecal incontinence type    THERAPY DIAG:  Other lack of coordination  Muscle weakness (generalized)  Rationale for Evaluation and Treatment: Rehabilitation  ONSET DATE: 2025  SUBJECTIVE:  SUBJECTIVE STATEMENT: The new medication is 75% more effective than the Gemtessa. Since I have been back from vacation, I have no inflammation in my legs. I am 75% better. I have 1-2 instances of urgency. I have had 2 full bladder leakage. Patient is reducing the amount of caffeine.  Fluid intake: water, soda, coffee, juice  PAIN:  Are you having pain? No  PRECAUTIONS: None  RED FLAGS: None   WEIGHT BEARING RESTRICTIONS: No  FALLS:  Has patient fallen in last 6 months? No  OCCUPATION: retired  ACTIVITY LEVEL : gym 2 days per week for  full body workout, walking 1-2 times per week  PLOF: Independent  PATIENT GOALS: reduce leakage, manage prolapse, reduce frequency  PERTINENT HISTORY:  Hypertension; Myomectomy with abdominal approach; Abdominal Hysterctomy Sexual abuse: No  BOWEL MOVEMENT: Pain with bowel movement: No Type of bowel movement:Strain none Fully empty rectum: Yes: goes multiple times per day   URINATION: Pain with urination: No Fully empty bladder: Yes: due to having to go several times, stand up then has to urinate Stream: Strong Urgency: Yes , has to gt to the bathroom quickly Frequency: day-10 times; night- 2 times Leakage: Urge to void, Walking to the bathroom, and Coughing Pads: Yes: 1 per day for security  INTERCOURSE:  Ability to have vaginal penetration Yes  Pain with intercourse: none  PREGNANCY: Vaginal deliveries 2 Tearing Yes: 3rd degree tear  PROLAPSE: Pressure and Bulge   OBJECTIVE:  Note: Objective measures were completed at Evaluation unless otherwise noted.  DIAGNOSTIC FINDINGS:  none  PATIENT SURVEYS:  PFIQ-7: 33 UIQ-7 33  COGNITION: Overall cognitive status: Within functional limits for tasks assessed     SENSATION: Light touch:    FUNCTIONAL TESTS:  Single leg stance : right 3 sec, left 4 sec   POSTURE: rounded shoulders, forward head, increased thoracic kyphosis, and posterior pelvic tilt   LUMBARAROM/PROM:  A/PROM A/PROM  eval  Flexion full  Extension Decreased by 25%  Right lateral flexion Decreased by 25%  Left lateral flexion Decreased by 25%  Right rotation Decreased by 25%  Left rotation Decreased by 25%   (Blank rows = not tested)  LOWER EXTREMITY ROM:  Passive ROM Right eval Left eval  Hip internal rotation 20 0   (Blank rows = not tested)  LOWER EXTREMITY MMT:  MMT Right eval Left eval  Hip flexion 5/5 4/5  Hip extension 4/5 4/5  Hip abduction 4/5 4/5   (Blank rows = not tested) PALPATION:  Pelvic Alignment: ASIS are  equal  Abdominal: difficulty with contracting the abdominals                External Perineal Exam: scar along the perineal body is restricted                              Internal Pelvic Floor: tightness along the posterior vaginal canal  Patient confirms identification and approves PT to assess internal pelvic floor and treatment Yes No emotional/communication barriers or cognitive limitation. Patient is motivated to learn. Patient understands and agrees with treatment goals and plan. PT explains patient will be examined in standing, sitting, and lying down to see how their muscles and joints work. When they are ready, they will be asked to remove their underwear so PT can examine their perineum. The patient is also given the option of providing their own chaperone as one is not provided in our facility. The patient  also has the right and is explained the right to defer or refuse any part of the evaluation or treatment including the internal exam. With the patient's consent, PT will use one gloved finger to gently assess the muscles of the pelvic floor, seeing how well it contracts and relaxes and if there is muscle symmetry. After, the patient will get dressed and PT and patient will discuss exam findings and plan of care. PT and patient discuss plan of care, schedule, attendance policy and HEP activities.   PELVIC MMT:   MMT eval 10/09/23  Vaginal 1/5 with gluteal contraction 2/5  (Blank rows = not tested)        TONE: Average tone of pelvic floor  PROLAPSE: Posterior wall weakness just above the introitus; anterior wall weakness just above the introitus  TODAY'S TREATMENT:   11/18/23 Manual: Myofascial release: Suction cup to thoracic lumbar area to reduce lumbar lordosis  Spinal mobilization: PA and rotational mobilization to T8-L5 grade 3 Neuromuscular re-education: Core facilitation: Sit to stand with core engaged, lifting pelvic floor and keeping the distance between the  pubic bone and rib cage Form correction: Quadruped lean buttocks to the side back and forth to stretch the SI joint Open book 10 x each way to increase trunk rotation Down training: Educated patient on the urge to void to deter the urgency and she was able to return demonstration. Educated her to do the urge to void while sleeping. Discussed with patient to drink water before going to bed instead of juice.  Therapeutic activities: Functional strengthening activities: Sit to stand with keeping the distance between the rib cage and pubic bone to reduce pressure on the pelvic floor Squatting with keeping upright posture and pushing upward through her feet with pelvic floor contraction Self-care: Showed patient video of a anterior wall prolapse to assist with her understanding what is happening of the bladder    10/09/23 Manual: Soft tissue mobilization: Manual release of the perineal body to improve pelvic floor contraction Internal pelvic floor techniques: No emotional/communication barriers or cognitive limitation. Patient is motivated to learn. Patient understands and agrees with treatment goals and plan. PT explains patient will be examined in standing, sitting, and lying down to see how their muscles and joints work. When they are ready, they will be asked to remove their underwear so PT can examine their perineum. The patient is also given the option of providing their own chaperone as one is not provided in our facility. The patient also has the right and is explained the right to defer or refuse any part of the evaluation or treatment including the internal exam. With the patient's consent, PT will use one gloved finger to gently assess the muscles of the pelvic floor, seeing how well it contracts and relaxes and if there is muscle symmetry. After, the patient will get dressed and PT and patient will discuss exam findings and plan of care. PT and patient discuss plan of care, schedule,  attendance policy and HEP activities.  Therapist using a gloved finger in the vaginal canal working on the posterior vaginal canal, iliococcygeus, transverse perineum to elongate the tissue to improve contraction Neuromuscular re-education: Pelvic floor contraction training: Standing hip adduction with pelvic floor contraction 10x each Press hand into thigh with pelvic floor contraction and abdominal contraction 10 x Sitting pelvic floor contraction 10 x Supine with therapist finger in the vaginal canal to give patient tactile cues to the pelvic floor    10/05/23 Neuromuscular re-education: Pelvic floor  contraction training: [place] stimulation to the pelvic floor 5 sec on and off, 50% duty cycle, 2 sec ramp up with pelvic floor contraction in supine when on, then with hip adduction with pelvic floor contraction, small heel slides with pelvic floor contraction and hip adduction resistance.  No stimulation with bridge with ball squeeze with pelvic floor contraction Hip adduction with pelvic floor contraction 15 x each side with pelvic floor contraction    PATIENT EDUCATION:  10/09/23 Education details: Access Code: [email or web address] Person educated: Patient Education method: Explanation, Demonstration, Actor cues, Verbal cues, and Handouts Education comprehension: verbalized understanding, returned demonstration, verbal cues required, tactile cues required, and needs further education  HOME EXERCISE PROGRAM: 10/09/23 Access Code: [email or web address] URL: [email or web address] Date: 10/09/2023 Prepared by: Channing Pereyra  Exercises - - Supine Heel Slide with Small Ball  - 1 x daily - 7 x weekly - 3 sets - 10 reps - Supine Bridge with Mini Swiss Ball Between Knees  - 1 x daily - 1 x weekly - 1 sets - 10 reps - Sidelying Hip Adduction  - 1 x daily - 3 x weekly - 1 sets - 10 reps - Standing Hip Adduction  - 1 x daily - 3 x weekly - 1 sets - 10 reps - Seated Abdominal Press into [employer]  - 1 x  daily - 3 x weekly - 1 sets - 10 reps    ASSESSMENT:  CLINICAL IMPRESSION: Patient is a 63 y.o. female who was seen today for physical therapy  treatment for rectocele and urinary leakage. Prolapse is 40% better. Patient feels she can contract the pelvic floor now.   Patient was educated on how to keep the rib cage from the pubic bone with squatting and going from sit to stand. She has urgency with going from sit to stand and educated on the urge to void. She has increased in thoracic kyphosis that increased pressure on the bladder due to decreased distance from rib cage and pubic bone. Patient will benefit from skilled therapy to improve pelvic floor strength and coordination to reduce leakage, urgency and manage her prolapse.   OBJECTIVE IMPAIRMENTS: decreased coordination, decreased ROM, decreased strength, increased fascial restrictions, and postural dysfunction.   ACTIVITY LIMITATIONS: continence and toileting  PARTICIPATION LIMITATIONS: cleaning, laundry, shopping, and community activity  PERSONAL FACTORS: Time since onset of injury/illness/exacerbation and 1-2 comorbidities: Hypertension; Myomectomy with abdominal approach; Abdominal Hysterctomy are also affecting patient's functional outcome.   REHAB POTENTIAL: Excellent  CLINICAL DECISION MAKING: Evolving/moderate complexity  EVALUATION COMPLEXITY: Moderate   GOALS: Goals reviewed with patient? Yes  SHORT TERM GOALS: Target date: 10/28/23  Patient is independent with her initial HEP to improve continence.  Baseline: Goal status: Met 10/09/23  2.  Patient is able to perform diaphragmatic breathing to elongate the pelvic floor for urgency.  Baseline:  Goal status: INITIAL  3.  Patient educated on the urge to void to reduce urgency when in pubic.  Baseline:  Goal status: Met 11/18/23  4.  Patient educated on what a prolapse is understand how to lay down with legs elevated to reduce prolapse during middle of day and after  strenuous activity.  Baseline:  Goal status: Met 11/18/23   LONG TERM GOALS: Target date: 03/02/23  Patient independent with advanced HEP for core and pelvic floor to reduce urinary leakage and urgency.  Baseline:  Goal status: INITIAL  2.  Patient educated on pressure management during daily activities to reduce strain on the prolapse and prevent further  progression.  Baseline:  Goal status: INITIAL  3.  Patient is able to have the urge to urinate and slowly work to the bathroom without leaking urine due to using her urge suppression techniques.  Baseline:  Goal status: INITIAL  4.  Patient is able to cough without leaking urine due to the ability to quickly contract the pelvic floor and pelvic floor strength is >/= 3/5.  Baseline:  Goal status: INITIAL   PLAN:  PT FREQUENCY: 1-2x/week  PT DURATION: other: 5 months  PLANNED INTERVENTIONS: 97110-Therapeutic exercises, 97530- Therapeutic activity, 97112- Neuromuscular re-education, 97535- Self Care, [id number]- Manual therapy, [id number]- Electrical stimulation (unattended), 20560 (1-2 muscles), 20561 (3+ muscles)- Dry Needling, Patient/Family education, Joint mobilization, Spinal mobilization, Cryotherapy, Moist heat, and Biofeedback  PLAN FOR NEXT SESSION: diaphragmatic breathing, abdominal contraction, thoracic extension exercises, SI joint mobility exercises  Channing Pereyra, PT 11/18/23 2:51 PM

## 2023-11-18 NOTE — Patient Instructions (Signed)
 Urge Incontinence  Ideal urination frequency is every 2-4 wakeful hours, which equates to 5-8 times within a 24-hour period.   Urge incontinence is leakage that occurs when the bladder muscle contracts, creating a sudden need to go before getting to the bathroom.   Going too often when your bladder isn't actually full can disrupt the body's automatic signals to store and hold urine longer, which will increase urgency/frequency.  In this case, the bladder "is running the show" and strategies can be learned to retrain this pattern.   One should be able to control the first urge to urinate, at around .  The bladder can hold up to a "grande latte," or . To help you gain control, practice the Urge Drill below when urgency strikes.  This drill will help retrain your bladder signals and allow you to store and hold urine longer.  The overall goal is to stretch out your time between voids to reach a more manageable voiding schedule.    Practice your quick flicks often throughout the day (each waking hour) even when you don't need feel the urge to go.  This will help strengthen your pelvic floor muscles, making them more effective in controlling leakage.  Urge Drill  When you feel an urge to go, follow these steps to regain control: Stop what you are doing and be still Take one deep breath, directing your air into your abdomen Think an affirming thought, such as "I've got this." Do 5 quick flicks of your pelvic floor Raise heels and plant them on the ground firmly Walk with control to the bathroom to void, or delay voiding [facility] [address], [address] [place], [place] [address] Phone # [phone] Fax [phone]

## 2023-11-19 ENCOUNTER — Ambulatory Visit (INDEPENDENT_AMBULATORY_CARE_PROVIDER_SITE_OTHER): Admitting: Audiology

## 2023-11-19 ENCOUNTER — Encounter (INDEPENDENT_AMBULATORY_CARE_PROVIDER_SITE_OTHER): Payer: Self-pay | Admitting: Physician Assistant

## 2023-11-19 ENCOUNTER — Ambulatory Visit (INDEPENDENT_AMBULATORY_CARE_PROVIDER_SITE_OTHER): Admitting: Physician Assistant

## 2023-11-19 VITALS — BP 152/88 | HR 55 | Temp 97.7°F | Ht 65.0 in | Wt 207.0 lb

## 2023-11-19 DIAGNOSIS — H6993 Unspecified Eustachian tube disorder, bilateral: Secondary | ICD-10-CM

## 2023-11-19 DIAGNOSIS — H9042 Sensorineural hearing loss, unilateral, left ear, with unrestricted hearing on the contralateral side: Secondary | ICD-10-CM

## 2023-11-19 NOTE — Progress Notes (Signed)
 Dear Dr. Johnny, Here is my assessment for our mutual patient, Lindell Tussey. Thank you for allowing me the opportunity to care for your patient. Please do not hesitate to contact me should you have any other questions. Sincerely, Chyrl Cohen PA-C  Otolaryngology Clinic Note Referring provider: Dr. Johnny HPI:  Olivia Carr is a 63 y.o. female kindly referred by Dr. Johnny   The patient is a 63 year old female seen in our office for follow-up evaluation status post tympanostomy tube placement.  She was last seen in the office on 09/14/2023.  Below is a recap of the encounter.  Update 11/19/2023  Since her last office visit she notes she has been doing well.  She notes she has traveled several times with no significant pain or issues with flying, she is happy about this.  She notes that her right ear still feels blocked and does not feel that her hearing has returned back to baseline.  No infectious signs or symptoms.  No drainage.   Independent Review of Additional Tests or Records:  Audiological evaluation 11/19/2023   Otoscopy: Right ear: Clear external ear canal and pressure equalization tube was visualized. Left ear:  Clear external ear canal and pressure equalization tube was visualized.   Tympanometry: Right ear: Type B- Large external ear canal volume with no middle ear pressure peak or tympanic membrane compliance. Left ear: Type B- Large external ear canal volume with no middle ear pressure peak or tympanic membrane compliance.     Pure tone Audiometry: Right ear- Normal hearing from [phone] Hz.   Obtained air-bone gap at 250Hz . Left ear-  Normal hearing from [phone] Hz, then mild to moderate presumably sensorineural hearing loss from 6000 Hz - 8000 Hz.   Speech Audiometry: Right ear- Speech Reception Threshold (SRT) was obtained at 20 dBHL. Left ear-Speech Reception Threshold (SRT) was obtained at 10 dBHL.   Word Recognition Score Tested using NU-6 (recorded) Right  ear: 100% was obtained at a presentation level of 55 dBHL with contralateral masking which is deemed as  excellent. Left ear: 100% was obtained at a presentation level of 55 dBHL with contralateral masking which is deemed as  excellent.   The hearing test results were completed under headphones and results are deemed to be of good reliability. Test technique:  conventional     Impression: Slight drop at 250 Hz in the right ear and from 6000-8000 Hz in the left ear when compared to audiogram from May 2025.  Recommendations: Follow up with ENT as scheduled for today. Return for a hearing evaluation if concerns with hearing changes arise or per MD recommendation. Consider various tinnitus strategies, including the use of a sound generator, and/or tinnitus retraining therapy.   PMH/Meds/All/SocHx/FamHx/ROS:   Past Medical History:  Diagnosis Date   Allergic rhinitis    sees Dr. Frutoso    Asthma    sees Dr. Frutoso    Benign tumor of pineal gland [facility])     last MRI 10-10-09, stable, no follow up planned   DUB (dysfunctional uterine bleeding)    GERD (gastroesophageal reflux disease)    History of bone density study 03-22-13   normal    Hypertension    NSVD (normal spontaneous vaginal delivery)    X2   Pneumonia    Routine gynecological examination    sees Dr. Curlee Guan      Past Surgical History:  Procedure Laterality Date   ABDOMINAL HYSTERECTOMY     LAVH   ANTERIOR CRUCIATE LIGAMENT REPAIR  right knee with medial and lateral meniscectomies 09/04/08   COLONOSCOPY  05/06/2021   per Dr. Julianne [facility]), benign polyps, repeat in 5 yrs   COLONOSCOPY  2025   DILATION AND CURETTAGE OF UTERUS     x 2   EUSTACHIAN TUBE DILATION     KNEE ARTHROSCOPY Left 2018   LIGAMENT REPAIR Left 08/24/2019   Procedure: LIGAMENT REPAIR;  Surgeon: Cristy Bonner DASEN, MD;  Location: [facility];  Service: Orthopedics;  Laterality: Left;   MYOMECTOMY ABDOMINAL APPROACH     RADIAL  HEAD ARTHROPLASTY Left 08/24/2019   Procedure: RADIAL HEAD ARTHROPLASTY;  Surgeon: Cristy Bonner DASEN, MD;  Location: [facility];  Service: Orthopedics;  Laterality: Left;   TONSILLECTOMY     WISDOM TOOTH EXTRACTION      Family History  Problem Relation Age of Onset   Hypertension Mother    Pancreatic cancer Father 85   Immunocompromised Brother    Heart disease Maternal Grandmother    Colon cancer Maternal Grandfather    Heart disease Paternal Grandmother    Heart disease Paternal Grandfather    Other Other        bowel disease   Diabetes Other    Hypertension Other    Coronary artery disease Other    Renal cancer Maternal Uncle    Breast cancer Neg Hx    BRCA 1/2 Neg Hx    Uterine cancer Neg Hx    Bladder Cancer Neg Hx      Social Connections: Socially Integrated (02/26/2023)   Social Connection and Isolation Panel    Frequency of Communication with Friends and Family: More than three times a week    Frequency of Social Gatherings with Friends and Family: Twice a week    Attends Religious Services: More than 4 times per year    Active Member of [employer] or Organizations: Yes    Attends Engineer, structural: More than 4 times per year    Marital Status: Living with partner      Current Outpatient Medications:    albuterol  (VENTOLIN  HFA) 108 (90 Base) MCG/ACT inhaler, Inhale 1-2 puffs into the lungs every 6 (six) hours as needed for wheezing or shortness of breath., Disp: , Rfl:    Calcium Carbonate-Vit D-Min (CALCIUM 1200 PO), Take 1 tablet by mouth every evening. , Disp: , Rfl:    cetirizine (ZYRTEC) 10 MG tablet, Take 10 mg by mouth daily., Disp: , Rfl:    diclofenac  Sodium (VOLTAREN ) 1 % GEL, Apply 2 g topically 4 (four) times daily., Disp: 50 g, Rfl: 0   EPINEPHrine  0.3 mg/0.3 mL IJ SOAJ injection, SMARTSIG:1 Auto-Injector Injection PRN, Disp: , Rfl:    esomeprazole  (NEXIUM ) 40 MG capsule, Take 1 capsule (40 mg total) by mouth daily., Disp: 90 capsule,  Rfl: 3   estradiol  (ESTRACE ) 0.1 MG/GM vaginal cream, Place 0.5g nightly for two weeks then twice a week after, Disp: 30 g, Rfl: 3   fluticasone  (FLONASE ) 50 MCG/ACT nasal spray, USE 2 SPRAYS IN EACH NOSTRIL DAILY, Disp: 48 g, Rfl: 3   metoprolol  succinate (TOPROL -XL) 25 MG 24 hr tablet, Take 1 tablet (25 mg total) by mouth daily., Disp: 90 tablet, Rfl: 3   mirabegron  ER (MYRBETRIQ ) 25 MG TB24 tablet, TAKE 1 TABLET (25 MG TOTAL) BY MOUTH DAILY., Disp: 30 tablet, Rfl: 0   montelukast  (SINGULAIR ) 10 MG tablet, Take 10 mg by mouth at bedtime., Disp: , Rfl:    potassium chloride  (KLOR-CON  M) 10  MEQ tablet, Take 1 tablet (10 mEq total) by mouth daily., Disp: 90 tablet, Rfl: 3   Physical Exam:   BP (!) 152/88 (BP Location: Right Arm, Patient Position: Sitting, Cuff Size: Normal)   Pulse (!) 55   Temp 97.7 F (36.5 C) (Oral)   Ht 5' 5 (1.651 m)   Wt 207 lb (93.9 kg)   SpO2 97%   BMI 34.45 kg/m   Pertinent Findings  CN II-XII intact Bilateral EAC clear with white tympanostomy tubes in place, they are patent, no surrounding erythema, no drainage No obvious neck masses/lymphadenopathy/thyromegaly No respiratory distress or stridor  Seprately Identifiable Procedures:  None  Impression & Plans:  Olivia Carr is a 63 y.o. female with the following   Eustachian tube dysfunction-  She does note some intermittent symptoms on the right side status post tube placement.  Overall symptoms have improved since original onset.  She is happy that she is able to fly without significant issues.  No concerning symptoms or concerns today.  Audiological evaluation reassuring.  I would like to see the patient back in 1 year for repeat audiological evaluation or sooner as needed.  The patient verbalized understanding and agreement to today's plan had no further questions or concerns.   - f/u 1 year with audio   Thank you for allowing me the opportunity to care for your patient. Please do not hesitate  to contact me should you have any other questions.  Sincerely, Chyrl Cohen PA-C [facility] Phone: [phone] Fax: [phone]  11/19/2023, 9:39 AM

## 2023-11-19 NOTE — Progress Notes (Addendum)
  [address] [place], [place] [address] [phone]  Audiological Evaluation    Name: Reganne Messerschmidt     DOB:   10/24/1960      MRN:   [id number]                                                                                     Service Date: 11/19/2023     Accompanied by: unaccompanied   Patient comes today after Reyes Cohen, PA-C sent a referral for a hearing evaluation due to concerns with post operatory hearing status after pressure equalization tubes were placed.   Symptoms Yes Details  Hearing loss  [x]  Reports perceives right ear is blocked.  06-19-23: Right ear- Normal to borderline normal hearing from [phone] Hz.  Left ear- Normal hearing from [phone] Hz, then mild presumably sensorineural hearing loss at 8000 Hz.  Tinnitus  [x]  Reports tinnitus seems worse after PR tube placement.  Ear pain/ infections/pressure  []    Balance problems  []    Noise exposure history  []    Previous ear surgeries  [x]  BMT:08-20-23  Family history of hearing loss  []    Amplification  []    Other  []      Otoscopy: Right ear: Clear external ear canal and pressure equalization tube was visualized. Left ear:  Clear external ear canal and pressure equalization tube was visualized.  Tympanometry: Right ear: Type B- Large external ear canal volume with no middle ear pressure peak or tympanic membrane compliance. Left ear: Type B- Large external ear canal volume with no middle ear pressure peak or tympanic membrane compliance.    Pure tone Audiometry: Right ear- Normal hearing from [phone] Hz.   Obtained air-bone gap at 250Hz . Left ear-  Normal hearing from [id number] Hz, then mild to moderate presumably sensorineural hearing loss from 6000 Hz - 8000 Hz.  Speech Audiometry: Right ear- Speech Reception Threshold (SRT) was obtained at 20 dBHL. Left ear-Speech Reception Threshold (SRT) was obtained at 10 dBHL.   Word Recognition Score Tested using NU-6  (recorded) Right ear: 100% was obtained at a presentation level of 55 dBHL with contralateral masking which is deemed as  excellent. Left ear: 100% was obtained at a presentation level of 55 dBHL with contralateral masking which is deemed as  excellent.   The hearing test results were completed under headphones and results are deemed to be of good reliability. Test technique:  conventional    Impression: Slight drop at 250 Hz in the right ear and from 6000-8000 Hz in the left ear when compared to audiogram from May 2025.  Recommendations: Follow up with ENT as scheduled for today. Return for a hearing evaluation if concerns with hearing changes arise or per MD recommendation. Consider various tinnitus strategies, including the use of a sound generator, and/or tinnitus retraining therapy.    Lindy Garczynski MARIE LEROUX-MARTINEZ, AUD

## 2023-11-20 ENCOUNTER — Ambulatory Visit: Admitting: Physical Therapy

## 2023-11-20 ENCOUNTER — Ambulatory Visit (INDEPENDENT_AMBULATORY_CARE_PROVIDER_SITE_OTHER): Admitting: Physician Assistant

## 2023-11-20 ENCOUNTER — Encounter: Payer: Self-pay | Admitting: Physical Therapy

## 2023-11-20 DIAGNOSIS — R278 Other lack of coordination: Secondary | ICD-10-CM | POA: Diagnosis not present

## 2023-11-20 DIAGNOSIS — M6281 Muscle weakness (generalized): Secondary | ICD-10-CM

## 2023-11-20 NOTE — Therapy (Signed)
 OUTPATIENT PHYSICAL THERAPY FEMALE PELVIC TREATMENT   Patient Name: Olivia Carr MRN: [id number] DOB:1960-10-02, 63 y.o., female Today's Date: 11/20/2023  END OF SESSION:  PT End of Session - 11/20/23 0804     Visit Number 5    Date for Recertification  03/01/24    Authorization Type [employer]    PT Start Time 0800    PT Stop Time 0840    PT Time Calculation (min) 40 min    Activity Tolerance Patient tolerated treatment well    Behavior During Therapy WFL for tasks assessed/performed          Past Medical History:  Diagnosis Date   Allergic rhinitis    sees Dr. Frutoso    Asthma    sees Dr. Frutoso    Benign tumor of pineal gland (HCC)     last MRI 10-10-09, stable, no follow up planned   DUB (dysfunctional uterine bleeding)    GERD (gastroesophageal reflux disease)    History of bone density study 03-22-13   normal    Hypertension    NSVD (normal spontaneous vaginal delivery)    X2   Pneumonia    Routine gynecological examination    sees Dr. Curlee Guan    Past Surgical History:  Procedure Laterality Date   ABDOMINAL HYSTERECTOMY     LAVH   ANTERIOR CRUCIATE LIGAMENT REPAIR     right knee with medial and lateral meniscectomies 09/04/08   COLONOSCOPY  05/06/2021   per Dr. Julianne Gwen), benign polyps, repeat in 5 yrs   COLONOSCOPY  2025   DILATION AND CURETTAGE OF UTERUS     x 2   EUSTACHIAN TUBE DILATION     KNEE ARTHROSCOPY Left 2018   LIGAMENT REPAIR Left 08/24/2019   Procedure: LIGAMENT REPAIR;  Surgeon: Cristy Bonner DASEN, MD;  Location: [facility];  Service: Orthopedics;  Laterality: Left;   MYOMECTOMY ABDOMINAL APPROACH     RADIAL HEAD ARTHROPLASTY Left 08/24/2019   Procedure: RADIAL HEAD ARTHROPLASTY;  Surgeon: Cristy Bonner DASEN, MD;  Location: [facility];  Service: Orthopedics;  Laterality: Left;   TONSILLECTOMY     WISDOM TOOTH EXTRACTION     Patient Active Problem List   Diagnosis Date Noted   Urinary  incontinence, mixed 09/25/2023   Pelvic organ prolapse quantification stage 2 rectocele 09/25/2023   Vaginal atrophy 09/25/2023   Incontinence of feces 09/25/2023   Nocturia 09/25/2023   History of recurrent UTI (urinary tract infection) 09/25/2023   COVID-19 virus infection 02/06/2020   SI (sacroiliac) pain 03/03/2014   Anxiety 01/19/2013   Perimenopause 01/19/2012   Acute upper respiratory infection 04/06/2010   Asthma with exacerbation 04/06/2010   BENIGN NEOPLASM OF PINEAL GLAND 09/24/2009   GASTROENTERITIS 07/13/2009   SORE THROAT 07/05/2009   FEVER UNSPECIFIED 07/05/2009   ASTHMA 01/22/2008   HYPERTENSION 10/12/2006   ALLERGIC RHINITIS 10/12/2006   GERD 10/08/2006   PNEUMONIA, HX OF 10/08/2006    PCP: Johnny garnette LABOR, MD  REFERRING PROVIDER: Guadlupe Lianne DASEN, MD   REFERRING DIAG:  N39.46 (ICD-10-CM) - Urinary incontinence, mixed  N81.6 (ICD-10-CM) - Pelvic organ prolapse quantification stage 2 rectocele  R15.9 (ICD-10-CM) - Incontinence of feces, unspecified fecal incontinence type    THERAPY DIAG:  Other lack of coordination  Muscle weakness (generalized)  Rationale for Evaluation and Treatment: Rehabilitation  ONSET DATE: 2025  SUBJECTIVE:  SUBJECTIVE STATEMENT: I was able to sleep through the night last night. I stalled the urge to urinate on the way home from shopping.    Fluid intake: water, soda, coffee, juice  PAIN:  Are you having pain? No  PRECAUTIONS: None  RED FLAGS: None   WEIGHT BEARING RESTRICTIONS: No  FALLS:  Has patient fallen in last 6 months? No  OCCUPATION: retired  ACTIVITY LEVEL : gym 2 days per week for full body workout, walking 1-2 times per week  PLOF: Independent  PATIENT GOALS: reduce leakage, manage prolapse, reduce frequency  PERTINENT  HISTORY:  Hypertension; Myomectomy with abdominal approach; Abdominal Hysterctomy Sexual abuse: No  BOWEL MOVEMENT: Pain with bowel movement: No Type of bowel movement:Strain none Fully empty rectum: Yes: goes multiple times per day   URINATION: Pain with urination: No Fully empty bladder: Yes: due to having to go several times, stand up then has to urinate Stream: Strong Urgency: Yes , has to gt to the bathroom quickly Frequency: day-10 times; night- 2 times Leakage: Urge to void, Walking to the bathroom, and Coughing Pads: Yes: 1 per day for security  INTERCOURSE:  Ability to have vaginal penetration Yes  Pain with intercourse: none  PREGNANCY: Vaginal deliveries 2 Tearing Yes: 3rd degree tear  PROLAPSE: Pressure and Bulge   OBJECTIVE:  Note: Objective measures were completed at Evaluation unless otherwise noted.  DIAGNOSTIC FINDINGS:  none  PATIENT SURVEYS:  PFIQ-7: 33 UIQ-7 33  COGNITION: Overall cognitive status: Within functional limits for tasks assessed     SENSATION: Light touch:    FUNCTIONAL TESTS:  Single leg stance : right 3 sec, left 4 sec   POSTURE: rounded shoulders, forward head, increased thoracic kyphosis, and posterior pelvic tilt   LUMBARAROM/PROM:  A/PROM A/PROM  eval  Flexion full  Extension Decreased by 25%  Right lateral flexion Decreased by 25%  Left lateral flexion Decreased by 25%  Right rotation Decreased by 25%  Left rotation Decreased by 25%   (Blank rows = not tested)  LOWER EXTREMITY ROM:  Passive ROM Right eval Left eval  Hip internal rotation 20 0   (Blank rows = not tested)  LOWER EXTREMITY MMT:  MMT Right eval Left eval  Hip flexion 5/5 4/5  Hip extension 4/5 4/5  Hip abduction 4/5 4/5   (Blank rows = not tested) PALPATION:  Pelvic Alignment: ASIS are equal  Abdominal: difficulty with contracting the abdominals                External Perineal Exam: scar along the perineal body is restricted                               Internal Pelvic Floor: tightness along the posterior vaginal canal  Patient confirms identification and approves PT to assess internal pelvic floor and treatment Yes No emotional/communication barriers or cognitive limitation. Patient is motivated to learn. Patient understands and agrees with treatment goals and plan. PT explains patient will be examined in standing, sitting, and lying down to see how their muscles and joints work. When they are ready, they will be asked to remove their underwear so PT can examine their perineum. The patient is also given the option of providing their own chaperone as one is not provided in our facility. The patient also has the right and is explained the right to defer or refuse any part of the evaluation or treatment including the internal exam.  With the patient's consent, PT will use one gloved finger to gently assess the muscles of the pelvic floor, seeing how well it contracts and relaxes and if there is muscle symmetry. After, the patient will get dressed and PT and patient will discuss exam findings and plan of care. PT and patient discuss plan of care, schedule, attendance policy and HEP activities.   PELVIC MMT:   MMT eval 10/09/23  Vaginal 1/5 with gluteal contraction 2/5  (Blank rows = not tested)        TONE: Average tone of pelvic floor  PROLAPSE: Posterior wall weakness just above the introitus; anterior wall weakness just above the introitus  TODAY'S TREATMENT:   11/20/23 Manual: Soft tissue mobilization: Manual work to the left quadratus, gluteus medius, and lumbar paraspinals Myofascial release: Using the suction cup on the lumbar and gluteus medius area Neuromuscular re-education: Form correction: Laying on side with foam roller under legs and move hip back and forth to increase SI mobiity Foam roller on the piriformis to increased tissue mobility Down training: Diaphragmatic breathing in supine, sitting,  standing     11/18/23 Manual: Myofascial release: Suction cup to thoracic lumbar area to reduce lumbar lordosis  Spinal mobilization: PA and rotational mobilization to T8-L5 grade 3 Neuromuscular re-education: Core facilitation: Sit to stand with core engaged, lifting pelvic floor and keeping the distance between the pubic bone and rib cage Form correction: Quadruped lean buttocks to the side back and forth to stretch the SI joint Open book 10 x each way to increase trunk rotation Down training: Educated patient on the urge to void to deter the urgency and she was able to return demonstration. Educated her to do the urge to void while sleeping. Discussed with patient to drink water before going to bed instead of juice.  Therapeutic activities: Functional strengthening activities: Sit to stand with keeping the distance between the rib cage and pubic bone to reduce pressure on the pelvic floor Squatting with keeping upright posture and pushing upward through her feet with pelvic floor contraction Self-care: Showed patient video of a anterior wall prolapse to assist with her understanding what is happening of the bladder    10/09/23 Manual: Soft tissue mobilization: Manual release of the perineal body to improve pelvic floor contraction Internal pelvic floor techniques: No emotional/communication barriers or cognitive limitation. Patient is motivated to learn. Patient understands and agrees with treatment goals and plan. PT explains patient will be examined in standing, sitting, and lying down to see how their muscles and joints work. When they are ready, they will be asked to remove their underwear so PT can examine their perineum. The patient is also given the option of providing their own chaperone as one is not provided in our facility. The patient also has the right and is explained the right to defer or refuse any part of the evaluation or treatment including the internal exam. With  the patient's consent, PT will use one gloved finger to gently assess the muscles of the pelvic floor, seeing how well it contracts and relaxes and if there is muscle symmetry. After, the patient will get dressed and PT and patient will discuss exam findings and plan of care. PT and patient discuss plan of care, schedule, attendance policy and HEP activities.  Therapist using a gloved finger in the vaginal canal working on the posterior vaginal canal, iliococcygeus, transverse perineum to elongate the tissue to improve contraction Neuromuscular re-education: Pelvic floor contraction training: Standing hip adduction with pelvic  floor contraction 10x each Press hand into thigh with pelvic floor contraction and abdominal contraction 10 x Sitting pelvic floor contraction 10 x Supine with therapist finger in the vaginal canal to give patient tactile cues to the pelvic floor      PATIENT EDUCATION:  10/09/23 Education details: Access Code: [email or web address] Person educated: Patient Education method: Explanation, Demonstration, Actor cues, Verbal cues, and Handouts Education comprehension: verbalized understanding, returned demonstration, verbal cues required, tactile cues required, and needs further education  HOME EXERCISE PROGRAM: 10/09/23 Access Code: [email or web address] URL: [email or web address] Date: 10/09/2023 Prepared by: Channing Pereyra  Exercises - - Supine Heel Slide with Small Ball  - 1 x daily - 7 x weekly - 3 sets - 10 reps - Supine Bridge with Mini Swiss Ball Between Knees  - 1 x daily - 1 x weekly - 1 sets - 10 reps - Sidelying Hip Adduction  - 1 x daily - 3 x weekly - 1 sets - 10 reps - Standing Hip Adduction  - 1 x daily - 3 x weekly - 1 sets - 10 reps - Seated Abdominal Press into [employer]  - 1 x daily - 3 x weekly - 1 sets - 10 reps    ASSESSMENT:  CLINICAL IMPRESSION: Patient is a 63 y.o. female who was seen today for physical therapy  treatment for rectocele and urinary  leakage. Patient is able to control the urge to void much better with using her techniques. She is able to perform diaphragmatic breathing and feel the pelvic floor relax in multiple positions. She is increasing her mobility to in the SI and lumbar area.  Patient will benefit from skilled therapy to improve pelvic floor strength and coordination to reduce leakage, urgency and manage her prolapse.   OBJECTIVE IMPAIRMENTS: decreased coordination, decreased ROM, decreased strength, increased fascial restrictions, and postural dysfunction.   ACTIVITY LIMITATIONS: continence and toileting  PARTICIPATION LIMITATIONS: cleaning, laundry, shopping, and community activity  PERSONAL FACTORS: Time since onset of injury/illness/exacerbation and 1-2 comorbidities: Hypertension; Myomectomy with abdominal approach; Abdominal Hysterctomy are also affecting patient's functional outcome.   REHAB POTENTIAL: Excellent  CLINICAL DECISION MAKING: Evolving/moderate complexity  EVALUATION COMPLEXITY: Moderate   GOALS: Goals reviewed with patient? Yes  SHORT TERM GOALS: Target date: 10/28/23  Patient is independent with her initial HEP to improve continence.  Baseline: Goal status: Met 10/09/23  2.  Patient is able to perform diaphragmatic breathing to elongate the pelvic floor for urgency.  Baseline:  Goal status: Met 11/20/23  3.  Patient educated on the urge to void to reduce urgency when in pubic.  Baseline:  Goal status: Met 11/18/23  4.  Patient educated on what a prolapse is understand how to lay down with legs elevated to reduce prolapse during middle of day and after strenuous activity.  Baseline:  Goal status: Met 11/18/23   LONG TERM GOALS: Target date: 03/02/23  Patient independent with advanced HEP for core and pelvic floor to reduce urinary leakage and urgency.  Baseline:  Goal status: INITIAL  2.  Patient educated on pressure management during daily activities to reduce strain on the  prolapse and prevent further progression.  Baseline:  Goal status: INITIAL  3.  Patient is able to have the urge to urinate and slowly work to the bathroom without leaking urine due to using her urge suppression techniques.  Baseline:  Goal status: INITIAL  4.  Patient is able to cough without leaking urine due to the ability to quickly contract the pelvic  floor and pelvic floor strength is >/= 3/5.  Baseline:  Goal status: INITIAL   PLAN:  PT FREQUENCY: 1-2x/week  PT DURATION: other: 5 months  PLANNED INTERVENTIONS: 97110-Therapeutic exercises, 97530- Therapeutic activity, 97112- Neuromuscular re-education, 97535- Self Care, [id number]- Manual therapy, [id number]- Electrical stimulation (unattended), 20560 (1-2 muscles), 20561 (3+ muscles)- Dry Needling, Patient/Family education, Joint mobilization, Spinal mobilization, Cryotherapy, Moist heat, and Biofeedback  PLAN FOR NEXT SESSION:  abdominal contraction, thoracic extension exercises, SI joint mobility exercises  Channing Pereyra, PT 11/20/23 8:42 AM

## 2023-11-23 ENCOUNTER — Encounter: Payer: Self-pay | Admitting: Physical Therapy

## 2023-11-23 ENCOUNTER — Ambulatory Visit: Admitting: Physical Therapy

## 2023-11-23 DIAGNOSIS — R278 Other lack of coordination: Secondary | ICD-10-CM

## 2023-11-23 DIAGNOSIS — M6281 Muscle weakness (generalized): Secondary | ICD-10-CM

## 2023-11-23 NOTE — Therapy (Signed)
 OUTPATIENT PHYSICAL THERAPY FEMALE PELVIC TREATMENT   Patient Name: Olivia Carr MRN: [id number] DOB:1960/10/19, 63 y.o., female Today's Date: 11/23/2023  END OF SESSION:  PT End of Session - 11/23/23 0802     Visit Number 6    Date for Recertification  03/01/24    Authorization Type [employer]    PT Start Time 0800    PT Stop Time 0840    PT Time Calculation (min) 40 min    Activity Tolerance Patient tolerated treatment well    Behavior During Therapy WFL for tasks assessed/performed          Past Medical History:  Diagnosis Date   Allergic rhinitis    sees Dr. Frutoso    Asthma    sees Dr. Frutoso    Benign tumor of pineal gland (HCC)     last MRI 10-10-09, stable, no follow up planned   DUB (dysfunctional uterine bleeding)    GERD (gastroesophageal reflux disease)    History of bone density study 03-22-13   normal    Hypertension    NSVD (normal spontaneous vaginal delivery)    X2   Pneumonia    Routine gynecological examination    sees Dr. Curlee Guan    Past Surgical History:  Procedure Laterality Date   ABDOMINAL HYSTERECTOMY     LAVH   ANTERIOR CRUCIATE LIGAMENT REPAIR     right knee with medial and lateral meniscectomies 09/04/08   COLONOSCOPY  05/06/2021   per Dr. Julianne Gwen), benign polyps, repeat in 5 yrs   COLONOSCOPY  2025   DILATION AND CURETTAGE OF UTERUS     x 2   EUSTACHIAN TUBE DILATION     KNEE ARTHROSCOPY Left 2018   LIGAMENT REPAIR Left 08/24/2019   Procedure: LIGAMENT REPAIR;  Surgeon: Cristy Bonner DASEN, MD;  Location: [facility];  Service: Orthopedics;  Laterality: Left;   MYOMECTOMY ABDOMINAL APPROACH     RADIAL HEAD ARTHROPLASTY Left 08/24/2019   Procedure: RADIAL HEAD ARTHROPLASTY;  Surgeon: Cristy Bonner DASEN, MD;  Location: [facility];  Service: Orthopedics;  Laterality: Left;   TONSILLECTOMY     WISDOM TOOTH EXTRACTION     Patient Active Problem List   Diagnosis Date Noted   Urinary  incontinence, mixed 09/25/2023   Pelvic organ prolapse quantification stage 2 rectocele 09/25/2023   Vaginal atrophy 09/25/2023   Incontinence of feces 09/25/2023   Nocturia 09/25/2023   History of recurrent UTI (urinary tract infection) 09/25/2023   COVID-19 virus infection 02/06/2020   SI (sacroiliac) pain 03/03/2014   Anxiety 01/19/2013   Perimenopause 01/19/2012   Acute upper respiratory infection 04/06/2010   Asthma with exacerbation 04/06/2010   BENIGN NEOPLASM OF PINEAL GLAND 09/24/2009   GASTROENTERITIS 07/13/2009   SORE THROAT 07/05/2009   FEVER UNSPECIFIED 07/05/2009   ASTHMA 01/22/2008   HYPERTENSION 10/12/2006   ALLERGIC RHINITIS 10/12/2006   GERD 10/08/2006   PNEUMONIA, HX OF 10/08/2006    PCP: Johnny garnette LABOR, MD  REFERRING PROVIDER: Guadlupe Lianne DASEN, MD   REFERRING DIAG:  N39.46 (ICD-10-CM) - Urinary incontinence, mixed  N81.6 (ICD-10-CM) - Pelvic organ prolapse quantification stage 2 rectocele  R15.9 (ICD-10-CM) - Incontinence of feces, unspecified fecal incontinence type    THERAPY DIAG:  Other lack of coordination  Muscle weakness (generalized)  Rationale for Evaluation and Treatment: Rehabilitation  ONSET DATE: 2025  SUBJECTIVE:  SUBJECTIVE STATEMENT: I felt good from last visit.   Fluid intake: water, soda, coffee, juice  PAIN:  Are you having pain? No  PRECAUTIONS: None  RED FLAGS: None   WEIGHT BEARING RESTRICTIONS: No  FALLS:  Has patient fallen in last 6 months? No  OCCUPATION: retired  ACTIVITY LEVEL : gym 2 days per week for full body workout, walking 1-2 times per week  PLOF: Independent  PATIENT GOALS: reduce leakage, manage prolapse, reduce frequency  PERTINENT HISTORY:  Hypertension; Myomectomy with abdominal approach; Abdominal  Hysterctomy Sexual abuse: No  BOWEL MOVEMENT: Pain with bowel movement: No Type of bowel movement:Strain none Fully empty rectum: Yes: goes multiple times per day   URINATION: Pain with urination: No Fully empty bladder: Yes: due to having to go several times, stand up then has to urinate Stream: Strong Urgency: Yes , has to gt to the bathroom quickly Frequency: day-10 times; night- 2 times Leakage: Urge to void, Walking to the bathroom, and Coughing Pads: Yes: 1 per day for security  INTERCOURSE:  Ability to have vaginal penetration Yes  Pain with intercourse: none  PREGNANCY: Vaginal deliveries 2 Tearing Yes: 3rd degree tear  PROLAPSE: Pressure and Bulge   OBJECTIVE:  Note: Objective measures were completed at Evaluation unless otherwise noted.  DIAGNOSTIC FINDINGS:  none  PATIENT SURVEYS:  PFIQ-7: 33 UIQ-7 33  COGNITION: Overall cognitive status: Within functional limits for tasks assessed     SENSATION: Light touch:    FUNCTIONAL TESTS:  Single leg stance : right 3 sec, left 4 sec   POSTURE: rounded shoulders, forward head, increased thoracic kyphosis, and posterior pelvic tilt   LUMBARAROM/PROM:  A/PROM A/PROM  eval 11/23/23  Flexion full full  Extension Decreased by 25% full  Right lateral flexion Decreased by 25% Decreased by 25%  Left lateral flexion Decreased by 25% Decreased by 25%  Right rotation Decreased by 25% full  Left rotation Decreased by 25% full   (Blank rows = not tested)  LOWER EXTREMITY ROM:  Passive ROM Right eval Left eval  Hip internal rotation 20 0   (Blank rows = not tested)  LOWER EXTREMITY MMT:  MMT Right eval Left eval  Hip flexion 5/5 4/5  Hip extension 4/5 4/5  Hip abduction 4/5 4/5   (Blank rows = not tested) PALPATION:  Pelvic Alignment: ASIS are equal  Abdominal: difficulty with contracting the abdominals                External Perineal Exam: scar along the perineal body is restricted                               Internal Pelvic Floor: tightness along the posterior vaginal canal  Patient confirms identification and approves PT to assess internal pelvic floor and treatment Yes No emotional/communication barriers or cognitive limitation. Patient is motivated to learn. Patient understands and agrees with treatment goals and plan. PT explains patient will be examined in standing, sitting, and lying down to see how their muscles and joints work. When they are ready, they will be asked to remove their underwear so PT can examine their perineum. The patient is also given the option of providing their own chaperone as one is not provided in our facility. The patient also has the right and is explained the right to defer or refuse any part of the evaluation or treatment including the internal exam. With the patient's consent, PT will  use one gloved finger to gently assess the muscles of the pelvic floor, seeing how well it contracts and relaxes and if there is muscle symmetry. After, the patient will get dressed and PT and patient will discuss exam findings and plan of care. PT and patient discuss plan of care, schedule, attendance policy and HEP activities.   PELVIC MMT:   MMT eval 10/09/23  Vaginal 1/5 with gluteal contraction 2/5  (Blank rows = not tested)        TONE: Average tone of pelvic floor  PROLAPSE: Posterior wall weakness just above the introitus; anterior wall weakness just above the introitus  TODAY'S TREATMENT:  11/23/23 Manual: Myofascial release: Using the suction cup on the lumbar and gluteus medius area Spinal mobilization: PA and rotational mobilization to T10-L5 grade 3 Neuromuscular re-education: Core facilitation: Side plank with hips on mat and ball between knees moving the arm forward and back with core engaged Standing with one foot ahead and rotate trunk to the back leg to open up the SI joint and trunk rotation 10 x each side Standing bringing 10 # kettle  bell to the outside of the forward leg then rotated to upright standing 10 x each Form correction: Prone bilateral hip internal rotation 20 x Cat cow with suction cup on the lumbar area Childs pose with suction cup on the lumbar area Supine hip flexion with extension relaxation then diagonal movements of the left hip going across the body     11/20/23 Manual: Soft tissue mobilization: Manual work to the left quadratus, gluteus medius, and lumbar paraspinals Myofascial release: Using the suction cup on the lumbar and gluteus medius area Neuromuscular re-education: Form correction: Laying on side with foam roller under legs and move hip back and forth to increase SI mobiity Foam roller on the piriformis to increased tissue mobility Down training: Diaphragmatic breathing in supine, sitting, standing     11/18/23 Manual: Myofascial release: Suction cup to thoracic lumbar area to reduce lumbar lordosis  Spinal mobilization: PA and rotational mobilization to T8-L5 grade 3 Neuromuscular re-education: Core facilitation: Sit to stand with core engaged, lifting pelvic floor and keeping the distance between the pubic bone and rib cage Form correction: Quadruped lean buttocks to the side back and forth to stretch the SI joint Open book 10 x each way to increase trunk rotation Down training: Educated patient on the urge to void to deter the urgency and she was able to return demonstration. Educated her to do the urge to void while sleeping. Discussed with patient to drink water before going to bed instead of juice.  Therapeutic activities: Functional strengthening activities: Sit to stand with keeping the distance between the rib cage and pubic bone to reduce pressure on the pelvic floor Squatting with keeping upright posture and pushing upward through her feet with pelvic floor contraction Self-care: Showed patient video of a anterior wall prolapse to assist with her understanding what  is happening of the bladder      PATIENT EDUCATION:  10/09/23 Education details: Access Code: [email or web address] Person educated: Patient Education method: Explanation, Demonstration, Tactile cues, Verbal cues, and Handouts Education comprehension: verbalized understanding, returned demonstration, verbal cues required, tactile cues required, and needs further education  HOME EXERCISE PROGRAM: 10/09/23 Access Code: [email or web address] URL: [email or web address] Date: 10/09/2023 Prepared by: Channing Pereyra  Exercises - - Supine Heel Slide with Small Ball  - 1 x daily - 7 x weekly - 3 sets - 10 reps - Supine Bridge with Mini Swiss [place] Between  Knees  - 1 x daily - 1 x weekly - 1 sets - 10 reps - Sidelying Hip Adduction  - 1 x daily - 3 x weekly - 1 sets - 10 reps - Standing Hip Adduction  - 1 x daily - 3 x weekly - 1 sets - 10 reps - Seated Abdominal Press into [employer]  - 1 x daily - 3 x weekly - 1 sets - 10 reps    ASSESSMENT:  CLINICAL IMPRESSION: Patient is a 63 y.o. female who was seen today for physical therapy  treatment for rectocele and urinary leakage. Patient urge to urinate decreased by 60%.  Patient has not coughed. Urinary leakage decreased by 50%. Prolapse is 75% better.  She is beginning to increase her lumbar ROM. She is having more mobility in the lumbar fascia. Patient will benefit from skilled therapy to improve pelvic floor strength and coordination to reduce leakage, urgency and manage her prolapse.   OBJECTIVE IMPAIRMENTS: decreased coordination, decreased ROM, decreased strength, increased fascial restrictions, and postural dysfunction.   ACTIVITY LIMITATIONS: continence and toileting  PARTICIPATION LIMITATIONS: cleaning, laundry, shopping, and community activity  PERSONAL FACTORS: Time since onset of injury/illness/exacerbation and 1-2 comorbidities: Hypertension; Myomectomy with abdominal approach; Abdominal Hysterctomy are also affecting patient's functional  outcome.   REHAB POTENTIAL: Excellent  CLINICAL DECISION MAKING: Evolving/moderate complexity  EVALUATION COMPLEXITY: Moderate   GOALS: Goals reviewed with patient? Yes  SHORT TERM GOALS: Target date: 10/28/23  Patient is independent with her initial HEP to improve continence.  Baseline: Goal status: Met 10/09/23  2.  Patient is able to perform diaphragmatic breathing to elongate the pelvic floor for urgency.  Baseline:  Goal status: Met 11/20/23  3.  Patient educated on the urge to void to reduce urgency when in pubic.  Baseline:  Goal status: Met 11/18/23  4.  Patient educated on what a prolapse is understand how to lay down with legs elevated to reduce prolapse during middle of day and after strenuous activity.  Baseline:  Goal status: Met 11/18/23   LONG TERM GOALS: Target date: 03/02/23  Patient independent with advanced HEP for core and pelvic floor to reduce urinary leakage and urgency.  Baseline:  Goal status: INITIAL  2.  Patient educated on pressure management during daily activities to reduce strain on the prolapse and prevent further progression.  Baseline:  Goal status: Ongoing 11/23/23  3.  Patient is able to have the urge to urinate and slowly work to the bathroom without leaking urine due to using her urge suppression techniques.  Baseline:  Goal status: ongoing 11/23/23  4.  Patient is able to cough without leaking urine due to the ability to quickly contract the pelvic floor and pelvic floor strength is >/= 3/5.  Baseline:  Goal status: ongoing 11/23/23    PLAN:  PT FREQUENCY: 1-2x/week  PT DURATION: other: 5 months  PLANNED INTERVENTIONS: 97110-Therapeutic exercises, 97530- Therapeutic activity, 97112- Neuromuscular re-education, 97535- Self Care, [id number]- Manual therapy, [id number]- Electrical stimulation (unattended), 20560 (1-2 muscles), 20561 (3+ muscles)- Dry Needling, Patient/Family education, Joint mobilization, Spinal mobilization, Cryotherapy,  Moist heat, and Biofeedback  PLAN FOR NEXT SESSION:  assess pelvic floor, abdominal contraction, thoracic extension exercises, SI joint mobility exercises  Channing Pereyra, PT 11/23/23 8:02 AM

## 2023-11-25 ENCOUNTER — Encounter: Payer: Self-pay | Admitting: Audiology

## 2023-11-27 ENCOUNTER — Encounter: Admitting: Physical Therapy

## 2023-11-30 ENCOUNTER — Encounter: Payer: Self-pay | Admitting: Physical Therapy

## 2023-11-30 ENCOUNTER — Ambulatory Visit: Payer: Self-pay | Attending: Obstetrics | Admitting: Physical Therapy

## 2023-11-30 DIAGNOSIS — M6281 Muscle weakness (generalized): Secondary | ICD-10-CM | POA: Diagnosis present

## 2023-11-30 DIAGNOSIS — R278 Other lack of coordination: Secondary | ICD-10-CM | POA: Diagnosis present

## 2023-11-30 NOTE — Therapy (Signed)
 OUTPATIENT PHYSICAL THERAPY FEMALE PELVIC TREATMENT   Patient Name: Olivia Carr MRN: [id number] DOB:10/04/1960, 63 y.o., female Today's Date: 11/30/2023  END OF SESSION:  PT End of Session - 11/30/23 1531     Visit Number 7    Date for Recertification  03/01/24    Authorization Type [employer]    PT Start Time 1530    PT Stop Time 1610    PT Time Calculation (min) 40 min    Activity Tolerance Patient tolerated treatment well    Behavior During Therapy WFL for tasks assessed/performed          Past Medical History:  Diagnosis Date   Allergic rhinitis    sees Dr. Frutoso    Asthma    sees Dr. Frutoso    Benign tumor of pineal gland (HCC)     last MRI 10-10-09, stable, no follow up planned   DUB (dysfunctional uterine bleeding)    GERD (gastroesophageal reflux disease)    History of bone density study 03-22-13   normal    Hypertension    NSVD (normal spontaneous vaginal delivery)    X2   Pneumonia    Routine gynecological examination    sees Dr. Curlee Guan    Past Surgical History:  Procedure Laterality Date   ABDOMINAL HYSTERECTOMY     LAVH   ANTERIOR CRUCIATE LIGAMENT REPAIR     right knee with medial and lateral meniscectomies 09/04/08   COLONOSCOPY  05/06/2021   per Dr. Julianne Gwen), benign polyps, repeat in 5 yrs   COLONOSCOPY  2025   DILATION AND CURETTAGE OF UTERUS     x 2   EUSTACHIAN TUBE DILATION     KNEE ARTHROSCOPY Left 2018   LIGAMENT REPAIR Left 08/24/2019   Procedure: LIGAMENT REPAIR;  Surgeon: Cristy Bonner DASEN, MD;  Location: [facility];  Service: Orthopedics;  Laterality: Left;   MYOMECTOMY ABDOMINAL APPROACH     RADIAL HEAD ARTHROPLASTY Left 08/24/2019   Procedure: RADIAL HEAD ARTHROPLASTY;  Surgeon: Cristy Bonner DASEN, MD;  Location: [facility];  Service: Orthopedics;  Laterality: Left;   TONSILLECTOMY     WISDOM TOOTH EXTRACTION     Patient Active Problem List   Diagnosis Date Noted   Urinary  incontinence, mixed 09/25/2023   Pelvic organ prolapse quantification stage 2 rectocele 09/25/2023   Vaginal atrophy 09/25/2023   Incontinence of feces 09/25/2023   Nocturia 09/25/2023   History of recurrent UTI (urinary tract infection) 09/25/2023   COVID-19 virus infection 02/06/2020   SI (sacroiliac) pain 03/03/2014   Anxiety 01/19/2013   Perimenopause 01/19/2012   Acute upper respiratory infection 04/06/2010   Asthma with exacerbation 04/06/2010   BENIGN NEOPLASM OF PINEAL GLAND 09/24/2009   GASTROENTERITIS 07/13/2009   SORE THROAT 07/05/2009   FEVER UNSPECIFIED 07/05/2009   ASTHMA 01/22/2008   HYPERTENSION 10/12/2006   ALLERGIC RHINITIS 10/12/2006   GERD 10/08/2006   PNEUMONIA, HX OF 10/08/2006    PCP: Johnny garnette LABOR, MD  REFERRING PROVIDER: Guadlupe Lianne DASEN, MD   REFERRING DIAG:  N39.46 (ICD-10-CM) - Urinary incontinence, mixed  N81.6 (ICD-10-CM) - Pelvic organ prolapse quantification stage 2 rectocele  R15.9 (ICD-10-CM) - Incontinence of feces, unspecified fecal incontinence type    THERAPY DIAG:  Other lack of coordination  Muscle weakness (generalized)  Rationale for Evaluation and Treatment: Rehabilitation  ONSET DATE: 2025  SUBJECTIVE:  SUBJECTIVE STATEMENT: My back and hip was better. Night time is fewer times to urinate for 1 time.  Fluid intake: water, soda, coffee, juice  PAIN:  Are you having pain? No  PRECAUTIONS: None  RED FLAGS: None   WEIGHT BEARING RESTRICTIONS: No  FALLS:  Has patient fallen in last 6 months? No  OCCUPATION: retired  ACTIVITY LEVEL : gym 2 days per week for full body workout, walking 1-2 times per week  PLOF: Independent  PATIENT GOALS: reduce leakage, manage prolapse, reduce frequency  PERTINENT HISTORY:  Hypertension; Myomectomy  with abdominal approach; Abdominal Hysterctomy Sexual abuse: No  BOWEL MOVEMENT: Pain with bowel movement: No Type of bowel movement:Strain none Fully empty rectum: Yes: goes multiple times per day   URINATION: Pain with urination: No Fully empty bladder: Yes: due to having to go several times, stand up then has to urinate Stream: Strong Urgency: Yes , has to gt to the bathroom quickly Frequency: day-10 times; night- 2 times Leakage: Urge to void, Walking to the bathroom, and Coughing Pads: Yes: 1 per day for security  INTERCOURSE:  Ability to have vaginal penetration Yes  Pain with intercourse: none  PREGNANCY: Vaginal deliveries 2 Tearing Yes: 3rd degree tear  PROLAPSE: Pressure and Bulge   OBJECTIVE:  Note: Objective measures were completed at Evaluation unless otherwise noted.  DIAGNOSTIC FINDINGS:  none  PATIENT SURVEYS:  PFIQ-7: 33 UIQ-7 33  COGNITION: Overall cognitive status: Within functional limits for tasks assessed     SENSATION: Light touch:    FUNCTIONAL TESTS:  Single leg stance : right 3 sec, left 4 sec   POSTURE: rounded shoulders, forward head, increased thoracic kyphosis, and posterior pelvic tilt   LUMBARAROM/PROM:  A/PROM A/PROM  eval 11/23/23  Flexion full full  Extension Decreased by 25% full  Right lateral flexion Decreased by 25% Decreased by 25%  Left lateral flexion Decreased by 25% Decreased by 25%  Right rotation Decreased by 25% full  Left rotation Decreased by 25% full   (Blank rows = not tested)  LOWER EXTREMITY ROM:  Passive ROM Right eval Left eval  Hip internal rotation 20 0   (Blank rows = not tested)  LOWER EXTREMITY MMT:  MMT Right eval Left eval  Hip flexion 5/5 4/5  Hip extension 4/5 4/5  Hip abduction 4/5 4/5   (Blank rows = not tested) PALPATION:  Pelvic Alignment: ASIS are equal  Abdominal: difficulty with contracting the abdominals                External Perineal Exam: scar along the  perineal body is restricted                              Internal Pelvic Floor: tightness along the posterior vaginal canal  Patient confirms identification and approves PT to assess internal pelvic floor and treatment Yes No emotional/communication barriers or cognitive limitation. Patient is motivated to learn. Patient understands and agrees with treatment goals and plan. PT explains patient will be examined in standing, sitting, and lying down to see how their muscles and joints work. When they are ready, they will be asked to remove their underwear so PT can examine their perineum. The patient is also given the option of providing their own chaperone as one is not provided in our facility. The patient also has the right and is explained the right to defer or refuse any part of the evaluation or treatment including  the internal exam. With the patient's consent, PT will use one gloved finger to gently assess the muscles of the pelvic floor, seeing how well it contracts and relaxes and if there is muscle symmetry. After, the patient will get dressed and PT and patient will discuss exam findings and plan of care. PT and patient discuss plan of care, schedule, attendance policy and HEP activities.   PELVIC MMT:   MMT eval 10/09/23 11/30/23  Vaginal 1/5 with gluteal contraction 2/5 2/5 with slight hug of therapist;   (Blank rows = not tested)        TONE: Average tone of pelvic floor  PROLAPSE: Posterior wall weakness just above the introitus; anterior wall weakness just above the introitus  TODAY'S TREATMENT:  11/30/23 Manual: Internal pelvic floor techniques: No emotional/communication barriers or cognitive limitation. Patient is motivated to learn. Patient understands and agrees with treatment goals and plan. PT explains patient will be examined in standing, sitting, and lying down to see how their muscles and joints work. When they are ready, they will be asked to remove their underwear so PT  can examine their perineum. The patient is also given the option of providing their own chaperone as one is not provided in our facility. The patient also has the right and is explained the right to defer or refuse any part of the evaluation or treatment including the internal exam. With the patient's consent, PT will use one gloved finger to gently assess the muscles of the pelvic floor, seeing how well it contracts and relaxes and if there is muscle symmetry. After, the patient will get dressed and PT and patient will discuss exam findings and plan of care. PT and patient discuss plan of care, schedule, attendance policy and HEP activities.  Therapist gloved finger in the vaginal canal working on the sides of the introitus, along the levator ani and obturator internist to elongate the tissue for contraction Neuromuscular re-education: Pelvic floor contraction training: Therapist gloved finger in the vaginal canal giving tactile cues to the sides of the introitus, verbal cues to not contract the gluteal, and contract the lower abdominal and not hold her breath Side plank with hips on mat and ball between knees moving the arm forward and back with core engaged Supine with yoga block between hands and shoulder flexion with knee extension 15 x each and VC to contract the lower abdominals.  Standing anti rotation with green band 20 x each side working on obliques Standing pulling green band for shoulder extension with marching    11/23/23 Manual: Myofascial release: Using the suction cup on the lumbar and gluteus medius area Spinal mobilization: PA and rotational mobilization to T10-L5 grade 3 Neuromuscular re-education: Core facilitation: Side plank with hips on mat and ball between knees moving the arm forward and back with core engaged Standing with one foot ahead and rotate trunk to the back leg to open up the SI joint and trunk rotation 10 x each side Standing bringing 10 # kettle bell to the  outside of the forward leg then rotated to upright standing 10 x each Form correction: Prone bilateral hip internal rotation 20 x Cat cow with suction cup on the lumbar area Childs pose with suction cup on the lumbar area Supine hip flexion with extension relaxation then diagonal movements of the left hip going across the body     11/20/23 Manual: Soft tissue mobilization: Manual work to the left quadratus, gluteus medius, and lumbar paraspinals Myofascial release: Using the  suction cup on the lumbar and gluteus medius area Neuromuscular re-education: Form correction: Laying on side with foam roller under legs and move hip back and forth to increase SI mobiity Foam roller on the piriformis to increased tissue mobility Down training: Diaphragmatic breathing in supine, sitting, standing         PATIENT EDUCATION:  10/09/23 Education details: Access Code: [email or web address] Person educated: Patient Education method: Programmer, Multimedia, Demonstration, Actor cues, Verbal cues, and Handouts Education comprehension: verbalized understanding, returned demonstration, verbal cues required, tactile cues required, and needs further education  HOME EXERCISE PROGRAM: 10/09/23 Access Code: [email or web address] URL: [email or web address] Date: 10/09/2023 Prepared by: Channing Pereyra  Exercises - - Supine Heel Slide with Small Ball  - 1 x daily - 7 x weekly - 3 sets - 10 reps - Supine Bridge with Mini Swiss Ball Between Knees  - 1 x daily - 1 x weekly - 1 sets - 10 reps - Sidelying Hip Adduction  - 1 x daily - 3 x weekly - 1 sets - 10 reps - Standing Hip Adduction  - 1 x daily - 3 x weekly - 1 sets - 10 reps - Seated Abdominal Press into [employer]  - 1 x daily - 3 x weekly - 1 sets - 10 reps    ASSESSMENT:  CLINICAL IMPRESSION: Patient is a 64 y.o. female who was seen today for physical therapy  treatment for rectocele and urinary leakage. Patient urge to urinate decreased by 60%.  Patient able to  control her urgency today. Patient has not coughed. Urinary leakage decreased by 50%. Prolapse is 75% better.  She wakes up 1 time instead of 2 times to urinate using the urge to urinate.  Pelvic floor strength is 2/5 with better hug of therapist finger. Patient has tightness along the sides of the introitus. She needed tactile cues to the muscles for pelvic floor contraction. Patient will benefit from skilled therapy to improve pelvic floor strength and coordination to reduce leakage, urgency and manage her prolapse.   OBJECTIVE IMPAIRMENTS: decreased coordination, decreased ROM, decreased strength, increased fascial restrictions, and postural dysfunction.   ACTIVITY LIMITATIONS: continence and toileting  PARTICIPATION LIMITATIONS: cleaning, laundry, shopping, and community activity  PERSONAL FACTORS: Time since onset of injury/illness/exacerbation and 1-2 comorbidities: Hypertension; Myomectomy with abdominal approach; Abdominal Hysterctomy are also affecting patient's functional outcome.   REHAB POTENTIAL: Excellent  CLINICAL DECISION MAKING: Evolving/moderate complexity  EVALUATION COMPLEXITY: Moderate   GOALS: Goals reviewed with patient? Yes  SHORT TERM GOALS: Target date: 10/28/23  Patient is independent with her initial HEP to improve continence.  Baseline: Goal status: Met 10/09/23  2.  Patient is able to perform diaphragmatic breathing to elongate the pelvic floor for urgency.  Baseline:  Goal status: Met 11/20/23  3.  Patient educated on the urge to void to reduce urgency when in pubic.  Baseline:  Goal status: Met 11/18/23  4.  Patient educated on what a prolapse is understand how to lay down with legs elevated to reduce prolapse during middle of day and after strenuous activity.  Baseline:  Goal status: Met 11/18/23   LONG TERM GOALS: Target date: 03/02/23  Patient independent with advanced HEP for core and pelvic floor to reduce urinary leakage and urgency.   Baseline:  Goal status: INITIAL  2.  Patient educated on pressure management during daily activities to reduce strain on the prolapse and prevent further progression.  Baseline:  Goal status: Ongoing 11/23/23  3.  Patient is able to have the  urge to urinate and slowly work to the bathroom without leaking urine due to using her urge suppression techniques.  Baseline:  Goal status: ongoing 11/23/23  4.  Patient is able to cough without leaking urine due to the ability to quickly contract the pelvic floor and pelvic floor strength is >/= 3/5.  Baseline:  Goal status: ongoing 11/23/23    PLAN:  PT FREQUENCY: 1-2x/week  PT DURATION: other: 5 months  PLANNED INTERVENTIONS: 97110-Therapeutic exercises, 97530- Therapeutic activity, 97112- Neuromuscular re-education, 97535- Self Care, [id number]- Manual therapy, [id number]- Electrical stimulation (unattended), 20560 (1-2 muscles), 20561 (3+ muscles)- Dry Needling, Patient/Family education, Joint mobilization, Spinal mobilization, Cryotherapy, Moist heat, and Biofeedback  PLAN FOR NEXT SESSION:  abdominal contraction, thoracic extension exercises, SI joint mobility exercises  Channing Pereyra, PT 11/30/23 4:10 PM

## 2023-12-07 ENCOUNTER — Ambulatory Visit: Admitting: Physical Therapy

## 2023-12-07 ENCOUNTER — Encounter: Payer: Self-pay | Admitting: Physical Therapy

## 2023-12-07 DIAGNOSIS — R278 Other lack of coordination: Secondary | ICD-10-CM | POA: Diagnosis not present

## 2023-12-07 DIAGNOSIS — M6281 Muscle weakness (generalized): Secondary | ICD-10-CM

## 2023-12-07 NOTE — Therapy (Signed)
 OUTPATIENT PHYSICAL THERAPY FEMALE PELVIC TREATMENT   Patient Name: Olivia Carr MRN: [id number] DOB:March 12, 1960, 63 y.o., female Today's Date: 12/07/2023  END OF SESSION:  PT End of Session - 12/07/23 1106     Visit Number 8    Date for Recertification  03/01/24    Authorization Type [employer]    PT Start Time 1100    PT Stop Time 1140    PT Time Calculation (min) 40 min    Activity Tolerance Patient tolerated treatment well    Behavior During Therapy WFL for tasks assessed/performed          Past Medical History:  Diagnosis Date   Allergic rhinitis    sees Dr. Frutoso    Asthma    sees Dr. Frutoso    Benign tumor of pineal gland (HCC)     last MRI 10-10-09, stable, no follow up planned   DUB (dysfunctional uterine bleeding)    GERD (gastroesophageal reflux disease)    History of bone density study 03-22-13   normal    Hypertension    NSVD (normal spontaneous vaginal delivery)    X2   Pneumonia    Routine gynecological examination    sees Dr. Curlee Guan    Past Surgical History:  Procedure Laterality Date   ABDOMINAL HYSTERECTOMY     LAVH   ANTERIOR CRUCIATE LIGAMENT REPAIR     right knee with medial and lateral meniscectomies 09/04/08   COLONOSCOPY  05/06/2021   per Dr. Julianne Gwen), benign polyps, repeat in 5 yrs   COLONOSCOPY  2025   DILATION AND CURETTAGE OF UTERUS     x 2   EUSTACHIAN TUBE DILATION     KNEE ARTHROSCOPY Left 2018   LIGAMENT REPAIR Left 08/24/2019   Procedure: LIGAMENT REPAIR;  Surgeon: Cristy Bonner DASEN, MD;  Location: [facility];  Service: Orthopedics;  Laterality: Left;   MYOMECTOMY ABDOMINAL APPROACH     RADIAL HEAD ARTHROPLASTY Left 08/24/2019   Procedure: RADIAL HEAD ARTHROPLASTY;  Surgeon: Cristy Bonner DASEN, MD;  Location: [facility];  Service: Orthopedics;  Laterality: Left;   TONSILLECTOMY     WISDOM TOOTH EXTRACTION     Patient Active Problem List   Diagnosis Date Noted   Urinary  incontinence, mixed 09/25/2023   Pelvic organ prolapse quantification stage 2 rectocele 09/25/2023   Vaginal atrophy 09/25/2023   Incontinence of feces 09/25/2023   Nocturia 09/25/2023   History of recurrent UTI (urinary tract infection) 09/25/2023   COVID-19 virus infection 02/06/2020   SI (sacroiliac) pain 03/03/2014   Anxiety 01/19/2013   Perimenopause 01/19/2012   Acute upper respiratory infection 04/06/2010   Asthma with exacerbation 04/06/2010   BENIGN NEOPLASM OF PINEAL GLAND 09/24/2009   GASTROENTERITIS 07/13/2009   SORE THROAT 07/05/2009   FEVER UNSPECIFIED 07/05/2009   ASTHMA 01/22/2008   HYPERTENSION 10/12/2006   ALLERGIC RHINITIS 10/12/2006   GERD 10/08/2006   PNEUMONIA, HX OF 10/08/2006    PCP: Johnny garnette LABOR, MD  REFERRING PROVIDER: Guadlupe Lianne DASEN, MD   REFERRING DIAG:  N39.46 (ICD-10-CM) - Urinary incontinence, mixed  N81.6 (ICD-10-CM) - Pelvic organ prolapse quantification stage 2 rectocele  R15.9 (ICD-10-CM) - Incontinence of feces, unspecified fecal incontinence type    THERAPY DIAG:  Other lack of coordination  Muscle weakness (generalized)  Rationale for Evaluation and Treatment: Rehabilitation  ONSET DATE: 2025  SUBJECTIVE:  SUBJECTIVE STATEMENT: I have been doing the hip mobility. Still have urinary urgency.  Fluid intake: water, soda, coffee, juice  PAIN:  Are you having pain? No  PRECAUTIONS: None  RED FLAGS: None   WEIGHT BEARING RESTRICTIONS: No  FALLS:  Has patient fallen in last 6 months? No  OCCUPATION: retired  ACTIVITY LEVEL : gym 2 days per week for full body workout, walking 1-2 times per week  PLOF: Independent  PATIENT GOALS: reduce leakage, manage prolapse, reduce frequency  PERTINENT HISTORY:  Hypertension; Myomectomy with abdominal  approach; Abdominal Hysterctomy Sexual abuse: No  BOWEL MOVEMENT: Pain with bowel movement: No Type of bowel movement:Strain none Fully empty rectum: Yes: goes multiple times per day   URINATION: Pain with urination: No Fully empty bladder: Yes: due to having to go several times, stand up then has to urinate Stream: Strong Urgency: Yes , has to gt to the bathroom quickly Frequency: day-10 times; night- 2 times Leakage: Urge to void, Walking to the bathroom, and Coughing Pads: Yes: 1 per day for security  INTERCOURSE:  Ability to have vaginal penetration Yes  Pain with intercourse: none  PREGNANCY: Vaginal deliveries 2 Tearing Yes: 3rd degree tear  PROLAPSE: Pressure and Bulge   OBJECTIVE:  Note: Objective measures were completed at Evaluation unless otherwise noted.  DIAGNOSTIC FINDINGS:  none  PATIENT SURVEYS:  PFIQ-7: 33 UIQ-7 33  COGNITION: Overall cognitive status: Within functional limits for tasks assessed     SENSATION: Light touch:    FUNCTIONAL TESTS:  Single leg stance : right 3 sec, left 4 sec   POSTURE: rounded shoulders, forward head, increased thoracic kyphosis, and posterior pelvic tilt   LUMBARAROM/PROM:  A/PROM A/PROM  eval 11/23/23  Flexion full full  Extension Decreased by 25% full  Right lateral flexion Decreased by 25% Decreased by 25%  Left lateral flexion Decreased by 25% Decreased by 25%  Right rotation Decreased by 25% full  Left rotation Decreased by 25% full   (Blank rows = not tested)  LOWER EXTREMITY ROM:  Passive ROM Right eval Left eval  Hip internal rotation 20 0   (Blank rows = not tested)  LOWER EXTREMITY MMT:  MMT Right eval Left eval  Hip flexion 5/5 4/5  Hip extension 4/5 4/5  Hip abduction 4/5 4/5   (Blank rows = not tested) PALPATION:  Pelvic Alignment: ASIS are equal  Abdominal: difficulty with contracting the abdominals                External Perineal Exam: scar along the perineal body is  restricted                              Internal Pelvic Floor: tightness along the posterior vaginal canal  Patient confirms identification and approves PT to assess internal pelvic floor and treatment Yes No emotional/communication barriers or cognitive limitation. Patient is motivated to learn. Patient understands and agrees with treatment goals and plan. PT explains patient will be examined in standing, sitting, and lying down to see how their muscles and joints work. When they are ready, they will be asked to remove their underwear so PT can examine their perineum. The patient is also given the option of providing their own chaperone as one is not provided in our facility. The patient also has the right and is explained the right to defer or refuse any part of the evaluation or treatment including the internal exam. With the  patient's consent, PT will use one gloved finger to gently assess the muscles of the pelvic floor, seeing how well it contracts and relaxes and if there is muscle symmetry. After, the patient will get dressed and PT and patient will discuss exam findings and plan of care. PT and patient discuss plan of care, schedule, attendance policy and HEP activities.   PELVIC MMT:   MMT eval 10/09/23 11/30/23 12/07/23  Vaginal 1/5 with gluteal contraction 2/5 2/5 with slight hug of therapist;  2/5  (Blank rows = not tested)        TONE: Average tone of pelvic floor  PROLAPSE: Posterior wall weakness just above the introitus; anterior wall weakness just above the introitus  TODAY'S TREATMENT:  12/07/23 Manual: Internal pelvic floor techniques: No emotional/communication barriers or cognitive limitation. Patient is motivated to learn. Patient understands and agrees with treatment goals and plan. PT explains patient will be examined in standing, sitting, and lying down to see how their muscles and joints work. When they are ready, they will be asked to remove their underwear so PT can  examine their perineum. The patient is also given the option of providing their own chaperone as one is not provided in our facility. The patient also has the right and is explained the right to defer or refuse any part of the evaluation or treatment including the internal exam. With the patient's consent, PT will use one gloved finger to gently assess the muscles of the pelvic floor, seeing how well it contracts and relaxes and if there is muscle symmetry. After, the patient will get dressed and PT and patient will discuss exam findings and plan of care. PT and patient discuss plan of care, schedule, attendance policy and HEP activities.  Therapist gloved finger performing release along the clitoris and the legs of the clitoris externally Therapist gloved finger performing release around the urethra and bladder , and manual work to the levator ani Neuromuscular re-education: Pelvic floor contraction training: Therapist gloved finger in the vaginal canal giving tactile cues to have her contract the pelvic floor muscles  Therapeutic activities: Functional strengthening activities: Educated patient on mechanics for not bearing down with exercise, not holding her breath with exercise, engaging the core with exercise.  Self-care: Educated patient on the prolapse, rectocele, how the organ are supported, how the restrictions around the clitoris can increase urinary urgency.     11/30/23 Manual: Internal pelvic floor techniques: No emotional/communication barriers or cognitive limitation. Patient is motivated to learn. Patient understands and agrees with treatment goals and plan. PT explains patient will be examined in standing, sitting, and lying down to see how their muscles and joints work. When they are ready, they will be asked to remove their underwear so PT can examine their perineum. The patient is also given the option of providing their own chaperone as one is not provided in our facility. The  patient also has the right and is explained the right to defer or refuse any part of the evaluation or treatment including the internal exam. With the patient's consent, PT will use one gloved finger to gently assess the muscles of the pelvic floor, seeing how well it contracts and relaxes and if there is muscle symmetry. After, the patient will get dressed and PT and patient will discuss exam findings and plan of care. PT and patient discuss plan of care, schedule, attendance policy and HEP activities.  Therapist gloved finger in the vaginal canal working on the sides of the  introitus, along the levator ani and obturator internist to elongate the tissue for contraction Neuromuscular re-education: Pelvic floor contraction training: Therapist gloved finger in the vaginal canal giving tactile cues to the sides of the introitus, verbal cues to not contract the gluteal, and contract the lower abdominal and not hold her breath Side plank with hips on mat and ball between knees moving the arm forward and back with core engaged Supine with yoga block between hands and shoulder flexion with knee extension 15 x each and VC to contract the lower abdominals.  Standing anti rotation with green band 20 x each side working on obliques Standing pulling green band for shoulder extension with marching    11/23/23 Manual: Myofascial release: Using the suction cup on the lumbar and gluteus medius area Spinal mobilization: PA and rotational mobilization to T10-L5 grade 3 Neuromuscular re-education: Core facilitation: Side plank with hips on mat and ball between knees moving the arm forward and back with core engaged Standing with one foot ahead and rotate trunk to the back leg to open up the SI joint and trunk rotation 10 x each side Standing bringing 10 # kettle bell to the outside of the forward leg then rotated to upright standing 10 x each Form correction: Prone bilateral hip internal rotation 20 x Cat cow  with suction cup on the lumbar area Childs pose with suction cup on the lumbar area Supine hip flexion with extension relaxation then diagonal movements of the left hip going across the body         PATIENT EDUCATION:  10/09/23 Education details: Access Code: [email or web address] Person educated: Patient Education method: Programmer, Multimedia, Demonstration, Actor cues, Verbal cues, and Handouts Education comprehension: verbalized understanding, returned demonstration, verbal cues required, tactile cues required, and needs further education  HOME EXERCISE PROGRAM: 10/09/23 Access Code: [email or web address] URL: [email or web address] Date: 10/09/2023 Prepared by: Channing Pereyra  Exercises - - Supine Heel Slide with Small Ball  - 1 x daily - 7 x weekly - 3 sets - 10 reps - Supine Bridge with Mini Swiss Ball Between Knees  - 1 x daily - 1 x weekly - 1 sets - 10 reps - Sidelying Hip Adduction  - 1 x daily - 3 x weekly - 1 sets - 10 reps - Standing Hip Adduction  - 1 x daily - 3 x weekly - 1 sets - 10 reps - Seated Abdominal Press into [employer]  - 1 x daily - 3 x weekly - 1 sets - 10 reps    ASSESSMENT:  CLINICAL IMPRESSION: Patient is a 63 y.o. female who was seen today for physical therapy  treatment for rectocele and urinary leakage.  Pelvic floor strength is 2/5. She understands what a prolapse is and how restrictions could contribute to the prolapse. Patient is doing her exercises routinely. She had some restrictions around the clitoris. Patient will benefit from skilled therapy to improve pelvic floor strength and coordination to reduce leakage, urgency and manage her prolapse.   OBJECTIVE IMPAIRMENTS: decreased coordination, decreased ROM, decreased strength, increased fascial restrictions, and postural dysfunction.   ACTIVITY LIMITATIONS: continence and toileting  PARTICIPATION LIMITATIONS: cleaning, laundry, shopping, and community activity  PERSONAL FACTORS: Time since onset of  injury/illness/exacerbation and 1-2 comorbidities: Hypertension; Myomectomy with abdominal approach; Abdominal Hysterctomy are also affecting patient's functional outcome.   REHAB POTENTIAL: Excellent  CLINICAL DECISION MAKING: Evolving/moderate complexity  EVALUATION COMPLEXITY: Moderate   GOALS: Goals reviewed with patient? Yes  SHORT TERM GOALS: Target date: 10/28/23  Patient  is independent with her initial HEP to improve continence.  Baseline: Goal status: Met 10/09/23  2.  Patient is able to perform diaphragmatic breathing to elongate the pelvic floor for urgency.  Baseline:  Goal status: Met 11/20/23  3.  Patient educated on the urge to void to reduce urgency when in pubic.  Baseline:  Goal status: Met 11/18/23  4.  Patient educated on what a prolapse is understand how to lay down with legs elevated to reduce prolapse during middle of day and after strenuous activity.  Baseline:  Goal status: Met 11/18/23   LONG TERM GOALS: Target date: 03/02/23  Patient independent with advanced HEP for core and pelvic floor to reduce urinary leakage and urgency.  Baseline:  Goal status: INITIAL  2.  Patient educated on pressure management during daily activities to reduce strain on the prolapse and prevent further progression.  Baseline:  Goal status: Ongoing 11/23/23  3.  Patient is able to have the urge to urinate and slowly work to the bathroom without leaking urine due to using her urge suppression techniques.  Baseline:  Goal status: ongoing 11/23/23  4.  Patient is able to cough without leaking urine due to the ability to quickly contract the pelvic floor and pelvic floor strength is >/= 3/5.  Baseline:  Goal status: ongoing 11/23/23    PLAN:  PT FREQUENCY: 1-2x/week  PT DURATION: other: 5 months  PLANNED INTERVENTIONS: 97110-Therapeutic exercises, 97530- Therapeutic activity, 97112- Neuromuscular re-education, 97535- Self Care, [id number]- Manual therapy, [id number]-  Electrical stimulation (unattended), 20560 (1-2 muscles), 20561 (3+ muscles)- Dry Needling, Patient/Family education, Joint mobilization, Spinal mobilization, Cryotherapy, Moist heat, and Biofeedback  PLAN FOR NEXT SESSION:  abdominal contraction, thoracic extension exercises, SI joint mobility exercises; see if the manual work helped with the urgency.   Channing Pereyra, PT 12/07/23 11:45 AM

## 2023-12-11 ENCOUNTER — Other Ambulatory Visit: Payer: Self-pay

## 2023-12-11 DIAGNOSIS — N3946 Mixed incontinence: Secondary | ICD-10-CM

## 2023-12-11 MED ORDER — MIRABEGRON ER 25 MG PO TB24: 25.0000 mg | ORAL_TABLET | Freq: Every day | ORAL | 3 refills | Status: DC

## 2023-12-14 ENCOUNTER — Encounter: Payer: Self-pay | Admitting: Physical Therapy

## 2023-12-14 ENCOUNTER — Ambulatory Visit: Admitting: Physical Therapy

## 2023-12-14 DIAGNOSIS — M6281 Muscle weakness (generalized): Secondary | ICD-10-CM

## 2023-12-14 DIAGNOSIS — R278 Other lack of coordination: Secondary | ICD-10-CM

## 2023-12-14 NOTE — Therapy (Signed)
 OUTPATIENT PHYSICAL THERAPY FEMALE PELVIC TREATMENT   Patient Name: Olivia Carr MRN: [id number] DOB:December 19, 1960, 63 y.o., female Today's Date: 12/14/2023  END OF SESSION:  PT End of Session - 12/14/23 0848     Visit Number 9    Date for Recertification  03/01/24    Authorization Type [employer]    PT Start Time 0845    PT Stop Time 0925    PT Time Calculation (min) 40 min    Activity Tolerance Patient tolerated treatment well    Behavior During Therapy [facility] for tasks assessed/performed          Past Medical History:  Diagnosis Date   Allergic rhinitis    sees Dr. Frutoso    Asthma    sees Dr. Frutoso    Benign tumor of pineal gland [facility])     last MRI 10-10-09, stable, no follow up planned   DUB (dysfunctional uterine bleeding)    GERD (gastroesophageal reflux disease)    History of bone density study 03-22-13   normal    Hypertension    NSVD (normal spontaneous vaginal delivery)    X2   Pneumonia    Routine gynecological examination    sees Dr. Curlee Guan    Past Surgical History:  Procedure Laterality Date   ABDOMINAL HYSTERECTOMY     LAVH   ANTERIOR CRUCIATE LIGAMENT REPAIR     right knee with medial and lateral meniscectomies 09/04/08   COLONOSCOPY  05/06/2021   per Dr. Julianne Gwen), benign polyps, repeat in 5 yrs   COLONOSCOPY  2025   DILATION AND CURETTAGE OF UTERUS     x 2   EUSTACHIAN TUBE DILATION     KNEE ARTHROSCOPY Left 2018   LIGAMENT REPAIR Left 08/24/2019   Procedure: LIGAMENT REPAIR;  Surgeon: Cristy Bonner DASEN, MD;  Location: [facility];  Service: Orthopedics;  Laterality: Left;   MYOMECTOMY ABDOMINAL APPROACH     RADIAL HEAD ARTHROPLASTY Left 08/24/2019   Procedure: RADIAL HEAD ARTHROPLASTY;  Surgeon: Cristy Bonner DASEN, MD;  Location: [facility];  Service: Orthopedics;  Laterality: Left;   TONSILLECTOMY     WISDOM TOOTH EXTRACTION     Patient Active Problem List   Diagnosis Date Noted   Urinary  incontinence, mixed 09/25/2023   Pelvic organ prolapse quantification stage 2 rectocele 09/25/2023   Vaginal atrophy 09/25/2023   Incontinence of feces 09/25/2023   Nocturia 09/25/2023   History of recurrent UTI (urinary tract infection) 09/25/2023   COVID-19 virus infection 02/06/2020   SI (sacroiliac) pain 03/03/2014   Anxiety 01/19/2013   Perimenopause 01/19/2012   Acute upper respiratory infection 04/06/2010   Asthma with exacerbation 04/06/2010   BENIGN NEOPLASM OF PINEAL GLAND 09/24/2009   GASTROENTERITIS 07/13/2009   SORE THROAT 07/05/2009   FEVER UNSPECIFIED 07/05/2009   ASTHMA 01/22/2008   HYPERTENSION 10/12/2006   ALLERGIC RHINITIS 10/12/2006   GERD 10/08/2006   PNEUMONIA, HX OF 10/08/2006    PCP: Johnny garnette LABOR, MD  REFERRING PROVIDER: Guadlupe Lianne DASEN, MD   REFERRING DIAG:  N39.46 (ICD-10-CM) - Urinary incontinence, mixed  N81.6 (ICD-10-CM) - Pelvic organ prolapse quantification stage 2 rectocele  R15.9 (ICD-10-CM) - Incontinence of feces, unspecified fecal incontinence type    THERAPY DIAG:  Other lack of coordination  Muscle weakness (generalized)  Rationale for Evaluation and Treatment: Rehabilitation  ONSET DATE: 2025  SUBJECTIVE:  SUBJECTIVE STATEMENT: I have been managing my caffeine. The manual work has helped. I have a coffee later in the day. This past week I have not had an incidence of urgency and leakage. The prolapse is not as bad and pronounced. I will see Dr. Guadlupe in December. My blood pressure is up.  Fluid intake: water, soda, coffee, juice  PAIN:  Are you having pain? No  PRECAUTIONS: None  RED FLAGS: None   WEIGHT BEARING RESTRICTIONS: No  FALLS:  Has patient fallen in last 6 months? No  OCCUPATION: retired  ACTIVITY LEVEL : gym 2 days per week  for full body workout, walking 1-2 times per week  PLOF: Independent  PATIENT GOALS: reduce leakage, manage prolapse, reduce frequency  PERTINENT HISTORY:  Hypertension; Myomectomy with abdominal approach; Abdominal Hysterctomy Sexual abuse: No  BOWEL MOVEMENT: Pain with bowel movement: No Type of bowel movement:Strain none Fully empty rectum: Yes: goes multiple times per day   URINATION: Pain with urination: No Fully empty bladder: Yes: due to having to go several times, stand up then has to urinate Stream: Strong Urgency: Yes , has to gt to the bathroom quickly Frequency: day-10 times; night- 2 times Leakage: Urge to void, Walking to the bathroom, and Coughing Pads: Yes: 1 per day for security  INTERCOURSE:  Ability to have vaginal penetration Yes  Pain with intercourse: none  PREGNANCY: Vaginal deliveries 2 Tearing Yes: 3rd degree tear  PROLAPSE: Pressure and Bulge   OBJECTIVE:  Note: Objective measures were completed at Evaluation unless otherwise noted.  DIAGNOSTIC FINDINGS:  none  PATIENT SURVEYS:  PFIQ-7: 33 UIQ-7 33  COGNITION: Overall cognitive status: Within functional limits for tasks assessed     SENSATION: Light touch:    FUNCTIONAL TESTS:  Single leg stance : right 3 sec, left 4 sec   POSTURE: rounded shoulders, forward head, increased thoracic kyphosis, and posterior pelvic tilt   LUMBARAROM/PROM:  A/PROM A/PROM  eval 11/23/23  Flexion full full  Extension Decreased by 25% full  Right lateral flexion Decreased by 25% Decreased by 25%  Left lateral flexion Decreased by 25% Decreased by 25%  Right rotation Decreased by 25% full  Left rotation Decreased by 25% full   (Blank rows = not tested)  LOWER EXTREMITY ROM:  Passive ROM Right eval Left eval  Hip internal rotation 20 0   (Blank rows = not tested)  LOWER EXTREMITY MMT:  MMT Right eval Left eval Right 12/14/23 Left 12/14/23  Hip flexion 5/5 4/5 5/5 5/5  Hip  extension 4/5 4/5 4/5 4/5  Hip abduction 4/5 4/5 4+/5 4+/5   (Blank rows = not tested) PALPATION:  Pelvic Alignment: ASIS are equal  Abdominal: difficulty with contracting the abdominals                External Perineal Exam: scar along the perineal body is restricted                              Internal Pelvic Floor: tightness along the posterior vaginal canal  Patient confirms identification and approves PT to assess internal pelvic floor and treatment Yes No emotional/communication barriers or cognitive limitation. Patient is motivated to learn. Patient understands and agrees with treatment goals and plan. PT explains patient will be examined in standing, sitting, and lying down to see how their muscles and joints work. When they are ready, they will be asked to remove their underwear so PT can  examine their perineum. The patient is also given the option of providing their own chaperone as one is not provided in our facility. The patient also has the right and is explained the right to defer or refuse any part of the evaluation or treatment including the internal exam. With the patient's consent, PT will use one gloved finger to gently assess the muscles of the pelvic floor, seeing how well it contracts and relaxes and if there is muscle symmetry. After, the patient will get dressed and PT and patient will discuss exam findings and plan of care. PT and patient discuss plan of care, schedule, attendance policy and HEP activities.   PELVIC MMT:   MMT eval 10/09/23 11/30/23 12/07/23  Vaginal 1/5 with gluteal contraction 2/5 2/5 with slight hug of therapist;  2/5  (Blank rows = not tested)        TONE: Average tone of pelvic floor  PROLAPSE: Posterior wall weakness just above the introitus; anterior wall weakness just above the introitus  TODAY'S TREATMENT:  12/14/23 Exercises: Strengthening: Alternate shoulder and hip flexion 20 x with core engaged Bridge with pushing the knees away  15 x Supine push hand into the knee and extend opposite leg 15 x each side x 2 Laying on side adduction 15 x each Standing with one leg back and stretch the SI joint holding the 20 # kettle bell and go diagonally 10 x each Standing anti rotational with green band 10 x each way working the obliques Standing bilateral shoulder extension with green band 10 x  Standing bilateral shoulder extension with green band and marching 20 x  Standing hip abduction with foot on slider working the pelvic floor 10 x each side    12/07/23 Manual: Internal pelvic floor techniques: No emotional/communication barriers or cognitive limitation. Patient is motivated to learn. Patient understands and agrees with treatment goals and plan. PT explains patient will be examined in standing, sitting, and lying down to see how their muscles and joints work. When they are ready, they will be asked to remove their underwear so PT can examine their perineum. The patient is also given the option of providing their own chaperone as one is not provided in our facility. The patient also has the right and is explained the right to defer or refuse any part of the evaluation or treatment including the internal exam. With the patient's consent, PT will use one gloved finger to gently assess the muscles of the pelvic floor, seeing how well it contracts and relaxes and if there is muscle symmetry. After, the patient will get dressed and PT and patient will discuss exam findings and plan of care. PT and patient discuss plan of care, schedule, attendance policy and HEP activities.  Therapist gloved finger performing release along the clitoris and the legs of the clitoris externally Therapist gloved finger performing release around the urethra and bladder , and manual work to the levator ani Neuromuscular re-education: Pelvic floor contraction training: Therapist gloved finger in the vaginal canal giving tactile cues to have her contract the  pelvic floor muscles  Therapeutic activities: Functional strengthening activities: Educated patient on mechanics for not bearing down with exercise, not holding her breath with exercise, engaging the core with exercise.  Self-care: Educated patient on the prolapse, rectocele, how the organ are supported, how the restrictions around the clitoris can increase urinary urgency.     11/30/23 Manual: Internal pelvic floor techniques: No emotional/communication barriers or cognitive limitation. Patient is motivated to learn.  Patient understands and agrees with treatment goals and plan. PT explains patient will be examined in standing, sitting, and lying down to see how their muscles and joints work. When they are ready, they will be asked to remove their underwear so PT can examine their perineum. The patient is also given the option of providing their own chaperone as one is not provided in our facility. The patient also has the right and is explained the right to defer or refuse any part of the evaluation or treatment including the internal exam. With the patient's consent, PT will use one gloved finger to gently assess the muscles of the pelvic floor, seeing how well it contracts and relaxes and if there is muscle symmetry. After, the patient will get dressed and PT and patient will discuss exam findings and plan of care. PT and patient discuss plan of care, schedule, attendance policy and HEP activities.  Therapist gloved finger in the vaginal canal working on the sides of the introitus, along the levator ani and obturator internist to elongate the tissue for contraction Neuromuscular re-education: Pelvic floor contraction training: Therapist gloved finger in the vaginal canal giving tactile cues to the sides of the introitus, verbal cues to not contract the gluteal, and contract the lower abdominal and not hold her breath Side plank with hips on mat and ball between knees moving the arm forward and back  with core engaged Supine with yoga block between hands and shoulder flexion with knee extension 15 x each and VC to contract the lower abdominals.  Standing anti rotation with green band 20 x each side working on obliques Standing pulling green band for shoulder extension with marching    PATIENT EDUCATION:  10/09/23 Education details: Access Code:   Person educated: Patient Education method: Explanation, Demonstration, Actor cues, Verbal cues, and Handouts Education comprehension: verbalized understanding, returned demonstration, verbal cues required, tactile cues required, and needs further education  HOME EXERCISE PROGRAM: 10/09/23 Access Code: [email or web address] URL: [email or web address] Date: 10/09/2023 Prepared by: Channing Pereyra  Exercises - - Supine Heel Slide with Small Ball  - 1 x daily - 7 x weekly - 3 sets - 10 reps - Supine Bridge with Mini Swiss Ball Between Knees  - 1 x daily - 1 x weekly - 1 sets - 10 reps - Sidelying Hip Adduction  - 1 x daily - 3 x weekly - 1 sets - 10 reps - Standing Hip Adduction  - 1 x daily - 3 x weekly - 1 sets - 10 reps - Seated Abdominal Press into [employer]  - 1 x daily - 3 x weekly - 1 sets - 10 reps    ASSESSMENT:  CLINICAL IMPRESSION: Patient is a 63 y.o. female who was seen today for physical therapy  treatment for rectocele and urinary leakage. She has not had urgency or urinary leakage this week. She is able to engage the core and pelvic floor better with her exercises.  Increased in bilateral hip strength. Patient will benefit from skilled therapy to improve pelvic floor strength and coordination to reduce leakage, urgency and manage her prolapse.   OBJECTIVE IMPAIRMENTS: decreased coordination, decreased ROM, decreased strength, increased fascial restrictions, and postural dysfunction.   ACTIVITY LIMITATIONS: continence and toileting  PARTICIPATION LIMITATIONS: cleaning, laundry, shopping, and community activity  PERSONAL  FACTORS: Time since onset of injury/illness/exacerbation and 1-2 comorbidities: Hypertension; Myomectomy with abdominal approach; Abdominal Hysterctomy are also affecting patient's functional outcome.   REHAB POTENTIAL: Excellent  CLINICAL DECISION MAKING: Evolving/moderate  complexity  EVALUATION COMPLEXITY: Moderate   GOALS: Goals reviewed with patient? Yes  SHORT TERM GOALS: Target date: 10/28/23  Patient is independent with her initial HEP to improve continence.  Baseline: Goal status: Met 10/09/23  2.  Patient is able to perform diaphragmatic breathing to elongate the pelvic floor for urgency.  Baseline:  Goal status: Met 11/20/23  3.  Patient educated on the urge to void to reduce urgency when in pubic.  Baseline:  Goal status: Met 11/18/23  4.  Patient educated on what a prolapse is understand how to lay down with legs elevated to reduce prolapse during middle of day and after strenuous activity.  Baseline:  Goal status: Met 11/18/23   LONG TERM GOALS: Target date: 03/02/23  Patient independent with advanced HEP for core and pelvic floor to reduce urinary leakage and urgency.  Baseline:  Goal status: INITIAL  2.  Patient educated on pressure management during daily activities to reduce strain on the prolapse and prevent further progression.  Baseline:  Goal status: Ongoing 11/23/23  3.  Patient is able to have the urge to urinate and slowly work to the bathroom without leaking urine due to using her urge suppression techniques.  Baseline:  Goal status: ongoing 11/23/23  4.  Patient is able to cough without leaking urine due to the ability to quickly contract the pelvic floor and pelvic floor strength is >/= 3/5.  Baseline:  Goal status: ongoing 11/23/23    PLAN:  PT FREQUENCY: 1-2x/week  PT DURATION: other: 5 months  PLANNED INTERVENTIONS: 97110-Therapeutic exercises, 97530- Therapeutic activity, 97112- Neuromuscular re-education, 97535- Self Care, [id number]-  Manual therapy, [id number]- Electrical stimulation (unattended), 20560 (1-2 muscles), 20561 (3+ muscles)- Dry Needling, Patient/Family education, Joint mobilization, Spinal mobilization, Cryotherapy, Moist heat, and Biofeedback  PLAN FOR NEXT SESSION:  abdominal contraction, thoracic extension exercises, SI joint mobility exercises; see she wants manual work for  urgency.   Channing Pereyra, PT 12/14/23 9:27 AM

## 2023-12-18 ENCOUNTER — Encounter: Payer: Self-pay | Admitting: Physical Therapy

## 2023-12-18 ENCOUNTER — Ambulatory Visit: Admitting: Physical Therapy

## 2023-12-18 DIAGNOSIS — M6281 Muscle weakness (generalized): Secondary | ICD-10-CM

## 2023-12-18 DIAGNOSIS — R278 Other lack of coordination: Secondary | ICD-10-CM | POA: Diagnosis not present

## 2023-12-18 NOTE — Therapy (Signed)
 OUTPATIENT PHYSICAL THERAPY FEMALE PELVIC TREATMENT   Patient Name: Olivia Carr MRN: [id number] DOB:1960-12-11, 63 y.o., female Today's Date: 12/18/2023  END OF SESSION:  PT End of Session - 12/18/23 0803     Visit Number 10    Date for Recertification  03/01/24    Authorization Type [employer]    PT Start Time 0800    PT Stop Time 0840    PT Time Calculation (min) 40 min    Activity Tolerance Patient tolerated treatment well    Behavior During Therapy WFL for tasks assessed/performed          Past Medical History:  Diagnosis Date   Allergic rhinitis    sees Dr. Frutoso    Asthma    sees Dr. Frutoso    Benign tumor of pineal gland (HCC)     last MRI 10-10-09, stable, no follow up planned   DUB (dysfunctional uterine bleeding)    GERD (gastroesophageal reflux disease)    History of bone density study 03-22-13   normal    Hypertension    NSVD (normal spontaneous vaginal delivery)    X2   Pneumonia    Routine gynecological examination    sees Dr. Curlee Guan    Past Surgical History:  Procedure Laterality Date   ABDOMINAL HYSTERECTOMY     LAVH   ANTERIOR CRUCIATE LIGAMENT REPAIR     right knee with medial and lateral meniscectomies 09/04/08   COLONOSCOPY  05/06/2021   per Dr. Julianne Gwen), benign polyps, repeat in 5 yrs   COLONOSCOPY  2025   DILATION AND CURETTAGE OF UTERUS     x 2   EUSTACHIAN TUBE DILATION     KNEE ARTHROSCOPY Left 2018   LIGAMENT REPAIR Left 08/24/2019   Procedure: LIGAMENT REPAIR;  Surgeon: Cristy Bonner DASEN, MD;  Location: [facility];  Service: Orthopedics;  Laterality: Left;   MYOMECTOMY ABDOMINAL APPROACH     RADIAL HEAD ARTHROPLASTY Left 08/24/2019   Procedure: RADIAL HEAD ARTHROPLASTY;  Surgeon: Cristy Bonner DASEN, MD;  Location: [facility];  Service: Orthopedics;  Laterality: Left;   TONSILLECTOMY     WISDOM TOOTH EXTRACTION     Patient Active Problem List   Diagnosis Date Noted   Urinary  incontinence, mixed 09/25/2023   Pelvic organ prolapse quantification stage 2 rectocele 09/25/2023   Vaginal atrophy 09/25/2023   Incontinence of feces 09/25/2023   Nocturia 09/25/2023   History of recurrent UTI (urinary tract infection) 09/25/2023   COVID-19 virus infection 02/06/2020   SI (sacroiliac) pain 03/03/2014   Anxiety 01/19/2013   Perimenopause 01/19/2012   Acute upper respiratory infection 04/06/2010   Asthma with exacerbation 04/06/2010   BENIGN NEOPLASM OF PINEAL GLAND 09/24/2009   GASTROENTERITIS 07/13/2009   SORE THROAT 07/05/2009   FEVER UNSPECIFIED 07/05/2009   ASTHMA 01/22/2008   HYPERTENSION 10/12/2006   ALLERGIC RHINITIS 10/12/2006   GERD 10/08/2006   PNEUMONIA, HX OF 10/08/2006    PCP: Johnny garnette LABOR, MD  REFERRING PROVIDER: Guadlupe Lianne DASEN, MD   REFERRING DIAG:  N39.46 (ICD-10-CM) - Urinary incontinence, mixed  N81.6 (ICD-10-CM) - Pelvic organ prolapse quantification stage 2 rectocele  R15.9 (ICD-10-CM) - Incontinence of feces, unspecified fecal incontinence type    THERAPY DIAG:  Other lack of coordination  Muscle weakness (generalized)  Rationale for Evaluation and Treatment: Rehabilitation  ONSET DATE: 2025  SUBJECTIVE:  SUBJECTIVE STATEMENT: I have tightness in the left hip. Prolapse is better and she is not seeing the prolapse. Reports the prolapse is 60% better. I have more urgency the first half of the day. I can go 3-4 days without a problem. Patient will leak urine with urgency.  Fluid intake: water, soda, coffee, juice  PAIN:  Are you having pain? No  PRECAUTIONS: None  RED FLAGS: None   WEIGHT BEARING RESTRICTIONS: No  FALLS:  Has patient fallen in last 6 months? No  OCCUPATION: retired  ACTIVITY LEVEL : gym 2 days per week for full body  workout, walking 1-2 times per week  PLOF: Independent  PATIENT GOALS: reduce leakage, manage prolapse, reduce frequency  PERTINENT HISTORY:  Hypertension; Myomectomy with abdominal approach; Abdominal Hysterctomy Sexual abuse: No  BOWEL MOVEMENT: Pain with bowel movement: No Type of bowel movement:Strain none Fully empty rectum: Yes: goes multiple times per day   URINATION: Pain with urination: No Fully empty bladder: Yes: due to having to go several times, stand up then has to urinate Stream: Strong Urgency: Yes , has to gt to the bathroom quickly Frequency: day-10 times; night- 2 times Leakage: Urge to void, Walking to the bathroom, and Coughing Pads: Yes: 1 per day for security  INTERCOURSE:  Ability to have vaginal penetration Yes  Pain with intercourse: none  PREGNANCY: Vaginal deliveries 2 Tearing Yes: 3rd degree tear  PROLAPSE: Pressure and Bulge   OBJECTIVE:  Note: Objective measures were completed at Evaluation unless otherwise noted.  DIAGNOSTIC FINDINGS:  none  PATIENT SURVEYS:  PFIQ-7: 33 UIQ-7 33  COGNITION: Overall cognitive status: Within functional limits for tasks assessed     SENSATION: Light touch:    FUNCTIONAL TESTS:  Single leg stance : right 3 sec, left 4 sec   POSTURE: rounded shoulders, forward head, increased thoracic kyphosis, and posterior pelvic tilt   LUMBARAROM/PROM:  A/PROM A/PROM  eval 11/23/23  Flexion full full  Extension Decreased by 25% full  Right lateral flexion Decreased by 25% Decreased by 25%  Left lateral flexion Decreased by 25% Decreased by 25%  Right rotation Decreased by 25% full  Left rotation Decreased by 25% full   (Blank rows = not tested)  LOWER EXTREMITY ROM:  Passive ROM Right eval Left eval  Hip internal rotation 20 0   (Blank rows = not tested)  LOWER EXTREMITY MMT:  MMT Right eval Left eval Right 12/14/23 Left 12/14/23  Hip flexion 5/5 4/5 5/5 5/5  Hip extension 4/5 4/5  4/5 4/5  Hip abduction 4/5 4/5 4+/5 4+/5   (Blank rows = not tested) PALPATION:  Pelvic Alignment: ASIS are equal  Abdominal: difficulty with contracting the abdominals                External Perineal Exam: scar along the perineal body is restricted                              Internal Pelvic Floor: tightness along the posterior vaginal canal  Patient confirms identification and approves PT to assess internal pelvic floor and treatment Yes No emotional/communication barriers or cognitive limitation. Patient is motivated to learn. Patient understands and agrees with treatment goals and plan. PT explains patient will be examined in standing, sitting, and lying down to see how their muscles and joints work. When they are ready, they will be asked to remove their underwear so PT can examine their perineum. The patient  is also given the option of providing their own chaperone as one is not provided in our facility. The patient also has the right and is explained the right to defer or refuse any part of the evaluation or treatment including the internal exam. With the patient's consent, PT will use one gloved finger to gently assess the muscles of the pelvic floor, seeing how well it contracts and relaxes and if there is muscle symmetry. After, the patient will get dressed and PT and patient will discuss exam findings and plan of care. PT and patient discuss plan of care, schedule, attendance policy and HEP activities.   PELVIC MMT:   MMT eval 10/09/23 11/30/23 12/07/23  Vaginal 1/5 with gluteal contraction 2/5 2/5 with slight hug of therapist;  2/5  (Blank rows = not tested)        TONE: Average tone of pelvic floor  PROLAPSE: Posterior wall weakness just above the introitus; anterior wall weakness just above the introitus  TODAY'S TREATMENT:  12/18/23 Manual: Soft tissue mobilization: Manual work to the left hip adductors, left quadriceps, left rectus femoris to elongate the  tissue Myofascial release: Fascial release of the anterior left hip musculature.  Internal pelvic floor techniques: No emotional/communication barriers or cognitive limitation. Patient is motivated to learn. Patient understands and agrees with treatment goals and plan. PT explains patient will be examined in standing, sitting, and lying down to see how their muscles and joints work. When they are ready, they will be asked to remove their underwear so PT can examine their perineum. The patient is also given the option of providing their own chaperone as one is not provided in our facility. The patient also has the right and is explained the right to defer or refuse any part of the evaluation or treatment including the internal exam. With the patient's consent, PT will use one gloved finger to gently assess the muscles of the pelvic floor, seeing how well it contracts and relaxes and if there is muscle symmetry. After, the patient will get dressed and PT and patient will discuss exam findings and plan of care. PT and patient discuss plan of care, schedule, attendance policy and HEP activities.  Therapist gloved hands with mobilizing the clitoris going through the different directions especially on the left Mobilization of the clitoral hood Self-care: Educated patient on how to mobilize the clitoris, pull the clitoral hood off the clitoris on the model    12/14/23 Exercises: Strengthening: Alternate shoulder and hip flexion 20 x with core engaged Bridge with pushing the knees away 15 x Supine push hand into the knee and extend opposite leg 15 x each side x 2 Laying on side adduction 15 x each Standing with one leg back and stretch the SI joint holding the 20 # kettle bell and go diagonally 10 x each Standing anti rotational with green band 10 x each way working the obliques Standing bilateral shoulder extension with green band 10 x  Standing bilateral shoulder extension with green band and marching  20 x  Standing hip abduction with foot on slider working the pelvic floor 10 x each side    12/07/23 Manual: Internal pelvic floor techniques: No emotional/communication barriers or cognitive limitation. Patient is motivated to learn. Patient understands and agrees with treatment goals and plan. PT explains patient will be examined in standing, sitting, and lying down to see how their muscles and joints work. When they are ready, they will be asked to remove their underwear so PT  can examine their perineum. The patient is also given the option of providing their own chaperone as one is not provided in our facility. The patient also has the right and is explained the right to defer or refuse any part of the evaluation or treatment including the internal exam. With the patient's consent, PT will use one gloved finger to gently assess the muscles of the pelvic floor, seeing how well it contracts and relaxes and if there is muscle symmetry. After, the patient will get dressed and PT and patient will discuss exam findings and plan of care. PT and patient discuss plan of care, schedule, attendance policy and HEP activities.  Therapist gloved finger performing release along the clitoris and the legs of the clitoris externally Therapist gloved finger performing release around the urethra and bladder , and manual work to the levator ani Neuromuscular re-education: Pelvic floor contraction training: Therapist gloved finger in the vaginal canal giving tactile cues to have her contract the pelvic floor muscles  Therapeutic activities: Functional strengthening activities: Educated patient on mechanics for not bearing down with exercise, not holding her breath with exercise, engaging the core with exercise.  Self-care: Educated patient on the prolapse, rectocele, how the organ are supported, how the restrictions around the clitoris can increase urinary urgency.     PATIENT EDUCATION:  10/09/23 Education  details: Access Code:   Person educated: Patient Education method: Explanation, Demonstration, Actor cues, Verbal cues, and Handouts Education comprehension: verbalized understanding, returned demonstration, verbal cues required, tactile cues required, and needs further education  HOME EXERCISE PROGRAM: 10/09/23 Access Code: [email or web address] URL: [email or web address] Date: 10/09/2023 Prepared by: Channing Pereyra  Exercises - - Supine Heel Slide with Small Ball  - 1 x daily - 7 x weekly - 3 sets - 10 reps - Supine Bridge with Mini Swiss Ball Between Knees  - 1 x daily - 1 x weekly - 1 sets - 10 reps - Sidelying Hip Adduction  - 1 x daily - 3 x weekly - 1 sets - 10 reps - Standing Hip Adduction  - 1 x daily - 3 x weekly - 1 sets - 10 reps - Seated Abdominal Press into [employer]  - 1 x daily - 3 x weekly - 1 sets - 10 reps    ASSESSMENT:  CLINICAL IMPRESSION: Patient is a 63 y.o. female who was seen today for physical therapy  treatment for rectocele and urinary leakage. Patient will leak urine with urgency. She has urgency every 3-4 days. She feel her prolapse is 60% better. She does not feel the prolapse. She understands how to mobilize her clitoris to assist with urgency. She had more tightness on the left side of the clitoris.  Patient will benefit from skilled therapy to improve pelvic floor strength and coordination to reduce leakage, urgency and manage her prolapse.   OBJECTIVE IMPAIRMENTS: decreased coordination, decreased ROM, decreased strength, increased fascial restrictions, and postural dysfunction.   ACTIVITY LIMITATIONS: continence and toileting  PARTICIPATION LIMITATIONS: cleaning, laundry, shopping, and community activity  PERSONAL FACTORS: Time since onset of injury/illness/exacerbation and 1-2 comorbidities: Hypertension; Myomectomy with abdominal approach; Abdominal Hysterctomy are also affecting patient's functional outcome.   REHAB POTENTIAL:  Excellent  CLINICAL DECISION MAKING: Evolving/moderate complexity  EVALUATION COMPLEXITY: Moderate   GOALS: Goals reviewed with patient? Yes  SHORT TERM GOALS: Target date: 10/28/23  Patient is independent with her initial HEP to improve continence.  Baseline: Goal status: Met 10/09/23  2.  Patient is able to perform diaphragmatic breathing  to elongate the pelvic floor for urgency.  Baseline:  Goal status: Met 11/20/23  3.  Patient educated on the urge to void to reduce urgency when in pubic.  Baseline:  Goal status: Met 11/18/23  4.  Patient educated on what a prolapse is understand how to lay down with legs elevated to reduce prolapse during middle of day and after strenuous activity.  Baseline:  Goal status: Met 11/18/23   LONG TERM GOALS: Target date: 03/02/23  Patient independent with advanced HEP for core and pelvic floor to reduce urinary leakage and urgency.  Baseline:  Goal status: INITIAL  2.  Patient educated on pressure management during daily activities to reduce strain on the prolapse and prevent further progression.  Baseline:  Goal status: Ongoing 11/23/23  3.  Patient is able to have the urge to urinate and slowly work to the bathroom without leaking urine due to using her urge suppression techniques.  Baseline:  Goal status: ongoing 11/23/23  4.  Patient is able to cough without leaking urine due to the ability to quickly contract the pelvic floor and pelvic floor strength is >/= 3/5.  Baseline:  Goal status: ongoing 11/23/23    PLAN:  PT FREQUENCY: 1-2x/week  PT DURATION: other: 5 months  PLANNED INTERVENTIONS: 97110-Therapeutic exercises, 97530- Therapeutic activity, 97112- Neuromuscular re-education, 97535- Self Care, [id number]- Manual therapy, [id number]- Electrical stimulation (unattended), 20560 (1-2 muscles), 20561 (3+ muscles)- Dry Needling, Patient/Family education, Joint mobilization, Spinal mobilization, Cryotherapy, Moist heat, and  Biofeedback  PLAN FOR NEXT SESSION:  abdominal contraction, thoracic extension exercises, SI joint mobility exercises; see she wants manual work for  urgency, assess for possible discharge  Channing Pereyra, PT 12/18/23 8:50 AM

## 2023-12-21 ENCOUNTER — Encounter: Payer: Self-pay | Admitting: Physical Therapy

## 2023-12-21 ENCOUNTER — Ambulatory Visit: Admitting: Physical Therapy

## 2023-12-21 DIAGNOSIS — R278 Other lack of coordination: Secondary | ICD-10-CM | POA: Diagnosis not present

## 2023-12-21 DIAGNOSIS — M6281 Muscle weakness (generalized): Secondary | ICD-10-CM

## 2023-12-21 NOTE — Therapy (Signed)
 OUTPATIENT PHYSICAL THERAPY FEMALE PELVIC TREATMENT   Patient Name: Olivia Carr MRN: [id number] DOB:11/08/1960, 63 y.o., female Today's Date: 12/21/2023  END OF SESSION:  PT End of Session - 12/21/23 0805     Visit Number 11    Date for Recertification  03/01/24    Authorization Type [employer]    PT Start Time 0800    PT Stop Time 0840    PT Time Calculation (min) 40 min    Activity Tolerance Patient tolerated treatment well    Behavior During Therapy WFL for tasks assessed/performed          Past Medical History:  Diagnosis Date   Allergic rhinitis    sees Dr. Frutoso    Asthma    sees Dr. Frutoso    Benign tumor of pineal gland (HCC)     last MRI 10-10-09, stable, no follow up planned   DUB (dysfunctional uterine bleeding)    GERD (gastroesophageal reflux disease)    History of bone density study 03-22-13   normal    Hypertension    NSVD (normal spontaneous vaginal delivery)    X2   Pneumonia    Routine gynecological examination    sees Dr. Curlee Guan    Past Surgical History:  Procedure Laterality Date   ABDOMINAL HYSTERECTOMY     LAVH   ANTERIOR CRUCIATE LIGAMENT REPAIR     right knee with medial and lateral meniscectomies 09/04/08   COLONOSCOPY  05/06/2021   per Dr. Julianne Gwen), benign polyps, repeat in 5 yrs   COLONOSCOPY  2025   DILATION AND CURETTAGE OF UTERUS     x 2   EUSTACHIAN TUBE DILATION     KNEE ARTHROSCOPY Left 2018   LIGAMENT REPAIR Left 08/24/2019   Procedure: LIGAMENT REPAIR;  Surgeon: Cristy Bonner DASEN, MD;  Location: [facility];  Service: Orthopedics;  Laterality: Left;   MYOMECTOMY ABDOMINAL APPROACH     RADIAL HEAD ARTHROPLASTY Left 08/24/2019   Procedure: RADIAL HEAD ARTHROPLASTY;  Surgeon: Cristy Bonner DASEN, MD;  Location:  SURGERY CENTER;  Service: Orthopedics;  Laterality: Left;   TONSILLECTOMY     WISDOM TOOTH EXTRACTION     Patient Active Problem List   Diagnosis Date Noted   Urinary  incontinence, mixed 09/25/2023   Pelvic organ prolapse quantification stage 2 rectocele 09/25/2023   Vaginal atrophy 09/25/2023   Incontinence of feces 09/25/2023   Nocturia 09/25/2023   History of recurrent UTI (urinary tract infection) 09/25/2023   COVID-19 virus infection 02/06/2020   SI (sacroiliac) pain 03/03/2014   Anxiety 01/19/2013   Perimenopause 01/19/2012   Acute upper respiratory infection 04/06/2010   Asthma with exacerbation 04/06/2010   BENIGN NEOPLASM OF PINEAL GLAND 09/24/2009   GASTROENTERITIS 07/13/2009   SORE THROAT 07/05/2009   FEVER UNSPECIFIED 07/05/2009   ASTHMA 01/22/2008   HYPERTENSION 10/12/2006   ALLERGIC RHINITIS 10/12/2006   GERD 10/08/2006   PNEUMONIA, HX OF 10/08/2006    PCP: Johnny garnette LABOR, MD  REFERRING PROVIDER: Guadlupe Lianne DASEN, MD   REFERRING DIAG:  N39.46 (ICD-10-CM) - Urinary incontinence, mixed  N81.6 (ICD-10-CM) - Pelvic organ prolapse quantification stage 2 rectocele  R15.9 (ICD-10-CM) - Incontinence of feces, unspecified fecal incontinence type    THERAPY DIAG:  Other lack of coordination  Muscle weakness (generalized)  Rationale for Evaluation and Treatment: Rehabilitation  ONSET DATE: 2025  SUBJECTIVE:  SUBJECTIVE STATEMENT: Last night I had 2 episodes of leakage.  Fluid intake: water, soda, coffee, juice  PAIN:  Are you having pain? No  PRECAUTIONS: None  RED FLAGS: None   WEIGHT BEARING RESTRICTIONS: No  FALLS:  Has patient fallen in last 6 months? No  OCCUPATION: retired  ACTIVITY LEVEL : gym 2 days per week for full body workout, walking 1-2 times per week  PLOF: Independent  PATIENT GOALS: reduce leakage, manage prolapse, reduce frequency  PERTINENT HISTORY:  Hypertension; Myomectomy with abdominal approach; Abdominal  Hysterctomy Sexual abuse: No  BOWEL MOVEMENT: Pain with bowel movement: No Type of bowel movement:Strain none Fully empty rectum: Yes: goes multiple times per day   URINATION: Pain with urination: No Fully empty bladder: Yes: due to having to go several times, stand up then has to urinate Stream: Strong Urgency: Yes , has to gt to the bathroom quickly Frequency: day-10 times; night- 2 times Leakage: Urge to void, Walking to the bathroom, and Coughing Pads: Yes: 1 per day for security  INTERCOURSE:  Ability to have vaginal penetration Yes  Pain with intercourse: none  PREGNANCY: Vaginal deliveries 2 Tearing Yes: 3rd degree tear  PROLAPSE: Pressure and Bulge   OBJECTIVE:  Note: Objective measures were completed at Evaluation unless otherwise noted.  DIAGNOSTIC FINDINGS:  none  PATIENT SURVEYS:  PFIQ-7: 33 UIQ-7 33  COGNITION: Overall cognitive status: Within functional limits for tasks assessed     SENSATION: Light touch:    FUNCTIONAL TESTS:  Single leg stance : right 3 sec, left 4 sec   POSTURE: rounded shoulders, forward head, increased thoracic kyphosis, and posterior pelvic tilt   LUMBARAROM/PROM:  A/PROM A/PROM  eval 11/23/23  Flexion full full  Extension Decreased by 25% full  Right lateral flexion Decreased by 25% Decreased by 25%  Left lateral flexion Decreased by 25% Decreased by 25%  Right rotation Decreased by 25% full  Left rotation Decreased by 25% full   (Blank rows = not tested)  LOWER EXTREMITY ROM:  Passive ROM Right eval Left eval Right/Left 12/21/23  Hip internal rotation 20 0 20   (Blank rows = not tested)  LOWER EXTREMITY MMT:  MMT Right eval Left eval Right 12/14/23 Left 12/14/23  Hip flexion 5/5 4/5 5/5 5/5  Hip extension 4/5 4/5 4/5 4/5  Hip abduction 4/5 4/5 4+/5 4+/5   (Blank rows = not tested) PALPATION:  Pelvic Alignment: ASIS are equal  Abdominal: difficulty with contracting the abdominals                 External Perineal Exam: scar along the perineal body is restricted                              Internal Pelvic Floor: tightness along the posterior vaginal canal  Patient confirms identification and approves PT to assess internal pelvic floor and treatment Yes No emotional/communication barriers or cognitive limitation. Patient is motivated to learn. Patient understands and agrees with treatment goals and plan. PT explains patient will be examined in standing, sitting, and lying down to see how their muscles and joints work. When they are ready, they will be asked to remove their underwear so PT can examine their perineum. The patient is also given the option of providing their own chaperone as one is not provided in our facility. The patient also has the right and is explained the right to defer or refuse any part of  the evaluation or treatment including the internal exam. With the patient's consent, PT will use one gloved finger to gently assess the muscles of the pelvic floor, seeing how well it contracts and relaxes and if there is muscle symmetry. After, the patient will get dressed and PT and patient will discuss exam findings and plan of care. PT and patient discuss plan of care, schedule, attendance policy and HEP activities.   PELVIC MMT:   MMT eval 10/09/23 11/30/23 12/07/23  Vaginal 1/5 with gluteal contraction 2/5 2/5 with slight hug of therapist;  2/5  (Blank rows = not tested)        TONE: Average tone of pelvic floor  PROLAPSE: Posterior wall weakness just above the introitus; anterior wall weakness just above the introitus  TODAY'S TREATMENT:  12/21/23 Exercises: Stretches/mobility: Assisted stretches for the hip adductors, hip internal rotation, piriformis stretch, hip flexor stretch in supine Siting happy baby with extra stretch to the lumbar area Long sit hamstring stretch holding 30 sec  Strengthening: Bridge 2 x but increased in posterior left knee pain Standing  hip abduction with foot on slider working the pelvic floor 10 x each side    12/18/23 Manual: Soft tissue mobilization: Manual work to the left hip adductors, left quadriceps, left rectus femoris to elongate the tissue Myofascial release: Fascial release of the anterior left hip musculature.  Internal pelvic floor techniques: No emotional/communication barriers or cognitive limitation. Patient is motivated to learn. Patient understands and agrees with treatment goals and plan. PT explains patient will be examined in standing, sitting, and lying down to see how their muscles and joints work. When they are ready, they will be asked to remove their underwear so PT can examine their perineum. The patient is also given the option of providing their own chaperone as one is not provided in our facility. The patient also has the right and is explained the right to defer or refuse any part of the evaluation or treatment including the internal exam. With the patient's consent, PT will use one gloved finger to gently assess the muscles of the pelvic floor, seeing how well it contracts and relaxes and if there is muscle symmetry. After, the patient will get dressed and PT and patient will discuss exam findings and plan of care. PT and patient discuss plan of care, schedule, attendance policy and HEP activities.  Therapist gloved hands with mobilizing the clitoris going through the different directions especially on the left Mobilization of the clitoral hood Self-care: Educated patient on how to mobilize the clitoris, pull the clitoral hood off the clitoris on the model    12/14/23 Exercises: Strengthening: Alternate shoulder and hip flexion 20 x with core engaged Bridge with pushing the knees away 15 x Supine push hand into the knee and extend opposite leg 15 x each side x 2 Laying on side adduction 15 x each Standing with one leg back and stretch the SI joint holding the 20 # kettle bell and go  diagonally 10 x each Standing anti rotational with green band 10 x each way working the obliques Standing bilateral shoulder extension with green band 10 x  Standing bilateral shoulder extension with green band and marching 20 x  Standing hip abduction with foot on slider working the pelvic floor 10 x each side    PATIENT EDUCATION:  10/09/23 Education details: Access Code:   Person educated: Patient Education method: Explanation, Facilities Manager, Actor cues, Verbal cues, and Handouts Education comprehension: verbalized understanding, returned demonstration, verbal  cues required, tactile cues required, and needs further education  HOME EXERCISE PROGRAM: 10/09/23 Access Code: [email or web address] URL: [email or web address] Date: 10/09/2023 Prepared by: Channing Pereyra  Exercises - - Supine Heel Slide with Small Ball  - 1 x daily - 7 x weekly - 3 sets - 10 reps - Supine Bridge with Mini Swiss Ball Between Knees  - 1 x daily - 1 x weekly - 1 sets - 10 reps - Sidelying Hip Adduction  - 1 x daily - 3 x weekly - 1 sets - 10 reps - Standing Hip Adduction  - 1 x daily - 3 x weekly - 1 sets - 10 reps - Seated Abdominal Press into [employer]  - 1 x daily - 3 x weekly - 1 sets - 10 reps    ASSESSMENT:  CLINICAL IMPRESSION: Patient is a 63 y.o. female who was seen today for physical therapy  treatment for rectocele and urinary leakage. Patient has increased bilateral hip internal rotation.  She had 2 episodes of urgency with urinary leakage 2 times last night. This may be due to fatigue of the pelvic floor muscles from all the activity that she did this weekend. Patient has difficulty with bridges due to left knee pain so stopped doing them. Patient will benefit from skilled therapy to improve pelvic floor strength and coordination to reduce leakage, urgency and manage her prolapse.   OBJECTIVE IMPAIRMENTS: decreased coordination, decreased ROM, decreased strength, increased fascial restrictions,  and postural dysfunction.   ACTIVITY LIMITATIONS: continence and toileting  PARTICIPATION LIMITATIONS: cleaning, laundry, shopping, and community activity  PERSONAL FACTORS: Time since onset of injury/illness/exacerbation and 1-2 comorbidities: Hypertension; Myomectomy with abdominal approach; Abdominal Hysterctomy are also affecting patient's functional outcome.   REHAB POTENTIAL: Excellent  CLINICAL DECISION MAKING: Evolving/moderate complexity  EVALUATION COMPLEXITY: Moderate   GOALS: Goals reviewed with patient? Yes  SHORT TERM GOALS: Target date: 10/28/23  Patient is independent with her initial HEP to improve continence.  Baseline: Goal status: Met 10/09/23  2.  Patient is able to perform diaphragmatic breathing to elongate the pelvic floor for urgency.  Baseline:  Goal status: Met 11/20/23  3.  Patient educated on the urge to void to reduce urgency when in pubic.  Baseline:  Goal status: Met 11/18/23  4.  Patient educated on what a prolapse is understand how to lay down with legs elevated to reduce prolapse during middle of day and after strenuous activity.  Baseline:  Goal status: Met 11/18/23   LONG TERM GOALS: Target date: 03/02/23  Patient independent with advanced HEP for core and pelvic floor to reduce urinary leakage and urgency.  Baseline:  Goal status: INITIAL  2.  Patient educated on pressure management during daily activities to reduce strain on the prolapse and prevent further progression.  Baseline:  Goal status: Ongoing 11/23/23  3.  Patient is able to have the urge to urinate and slowly work to the bathroom without leaking urine due to using her urge suppression techniques.  Baseline:  Goal status: ongoing 11/23/23  4.  Patient is able to cough without leaking urine due to the ability to quickly contract the pelvic floor and pelvic floor strength is >/= 3/5.  Baseline:  Goal status: ongoing 11/23/23    PLAN:  PT FREQUENCY: 1-2x/week  PT  DURATION: other: 5 months  PLANNED INTERVENTIONS: 97110-Therapeutic exercises, 97530- Therapeutic activity, 97112- Neuromuscular re-education, 97535- Self Care, [id number]- Manual therapy, [id number]- Electrical stimulation (unattended), 20560 (1-2 muscles), 20561 (3+ muscles)- Dry Needling, Patient/Family education, Joint  mobilization, Spinal mobilization, Cryotherapy, Moist heat, and Biofeedback  PLAN FOR NEXT SESSION:  abdominal contraction, thoracic extension exercises, SI joint mobility exercises; see she wants manual work for  urgency,  Channing Pereyra, PT 12/21/23 8:45 AM

## 2023-12-28 ENCOUNTER — Encounter: Payer: Self-pay | Admitting: Obstetrics

## 2023-12-28 ENCOUNTER — Ambulatory Visit: Admitting: Obstetrics

## 2023-12-28 VITALS — BP 134/84 | HR 82

## 2023-12-28 DIAGNOSIS — N816 Rectocele: Secondary | ICD-10-CM

## 2023-12-28 DIAGNOSIS — M533 Sacrococcygeal disorders, not elsewhere classified: Secondary | ICD-10-CM | POA: Diagnosis not present

## 2023-12-28 DIAGNOSIS — N952 Postmenopausal atrophic vaginitis: Secondary | ICD-10-CM

## 2023-12-28 DIAGNOSIS — R351 Nocturia: Secondary | ICD-10-CM

## 2023-12-28 DIAGNOSIS — R159 Full incontinence of feces: Secondary | ICD-10-CM

## 2023-12-28 DIAGNOSIS — N3946 Mixed incontinence: Secondary | ICD-10-CM

## 2023-12-28 DIAGNOSIS — Z87440 Personal history of urinary (tract) infections: Secondary | ICD-10-CM

## 2023-12-28 MED ORDER — MIRABEGRON ER 50 MG PO TB24: 50.0000 mg | ORAL_TABLET | Freq: Every day | ORAL | 2 refills | Status: DC

## 2023-12-28 NOTE — Assessment & Plan Note (Signed)
-   minimal symptoms noted since onset of symptoms  - For treatment of pelvic organ prolapse, we discussed options for management including expectant management, conservative management, and surgical management, such as Kegels, a pessary, pelvic floor physical therapy, and specific surgical procedures. - reduction of bulge symptoms since pelvic floor PT - continue low dose vaginal estrogen - encouraged to continue weight reduction and splinting  PRN bladder and bowel symptoms

## 2023-12-28 NOTE — Assessment & Plan Note (Signed)
-   09/25/23 POCT UA + uro, PVR 88mL - urgency > stress, positive CST with minimal SUI symptoms only during URI - We discussed the symptoms of overactive bladder (OAB), which include urinary urgency, urinary frequency, nocturia, with or without urge incontinence.  While we do not know the exact etiology of OAB, several treatment options exist. We discussed management including behavioral therapy (decreasing bladder irritants, urge suppression strategies, timed voids, bladder retraining), physical therapy, medication; for refractory cases posterior tibial nerve stimulation, sacral neuromodulation, and intravesical botulinum toxin injection.  For anticholinergic medications, we discussed the potential side effects of anticholinergics including dry eyes, dry mouth, constipation, cognitive impairment and urinary retention. For Beta-3 agonist medication, we discussed the potential side effect of elevated blood pressure which is more likely to occur in individuals with uncontrolled hypertension. - relief with Gemtesa , not covered by insurance and requires appeal - Mirabegron  25mg  with 60-70% relief, advised to continue to monitor night time BP and start diary for review with PCP visit in 1 week. Increase to 50mg  if BP < 160/110, however reviewed signs and symptoms of HTN that warrants discontinuation and immediate evaluation - discussed avoidance of anti-cholinergics due to potassium supplementation - encouraged to conitnue fluid management, caffeine reduction and continue weight reduction - continue pelvic floor PT due to relief - For treatment of stress urinary incontinence,  non-surgical options include expectant management, weight loss, physical therapy, as well as a pessary.  Surgical options include a midurethral sling, Burch urethropexy, and transurethral injection of a bulking agent. - continue low dose vaginal estrogen

## 2023-12-28 NOTE — Assessment & Plan Note (Signed)
-   resolved with Gemtesa  after 2 weeks, not covered by insurance - continues 1x/night on mirabegron  25mg , increase to 50mg  pending BP monitoring - will proceed with appeal for Gemtesa  - avoid fluid intake 3 hours before bedtime - elevated feet during the day or use compression socks to reduce lower extremity swelling - switch bedtime dosing of medications to dinnertime - due to snoring, consider sleep study to r/o sleep apnea

## 2023-12-28 NOTE — Assessment & Plan Note (Signed)
-   For symptomatic vaginal atrophy options include lubrication with a water-based lubricant, personal hygiene measures and barrier protection against wetness, and estrogen replacement in the form of vaginal cream, vaginal tablets, or a time-released vaginal ring.   - continue low dose vaginal estrogen 1g BID due to relief

## 2023-12-28 NOTE — Assessment & Plan Note (Signed)
-   encouraged to continue pelvic binder, weight reduction and sleep with pillow between her legs on her side due to relief - encouraged Voltaren  gel up to 2g 4x/day PRN pain - The origin of muscle spasm can be multifactorial, including primary, reactive to a different pain source, trauma, or even part of a centralized pain syndrome.Treatment options include physical therapy, local or oral pain meds, injections or centrally acting pain medications.   - continue pelvic floor PT

## 2023-12-28 NOTE — Assessment & Plan Note (Signed)
-   h/o 3rd degree laceration repaired with forceps and VAVD - Treatment options include anti-diarrhea medication (loperamide/ Imodium OTC or prescription lomotil), fiber supplements, physical therapy, and possible sacral neuromodulation or surgery.   - encouraged fiber supplementation and titration as needed to optimize stool consistency - continue pelvic floor PT due to improvement

## 2023-12-28 NOTE — Assessment & Plan Note (Signed)
-   For treatment of recurrent urinary tract infections, we discussed diagnosis of UTI and encouraged office testing with urine culture to r/o LUTS vs. Resistant uropathogen. - management of recurrent UTIs including prophylaxis with a daily low dose antibiotic, transvaginal estrogen therapy, D-mannose, and cranberry supplements.  We discussed the role of diagnostic testing such as cystoscopy and upper tract imaging.   - continue low dose vaginal estrogen and reassess symptoms - continue pyridium  or Ibuprofen use as needed with UTI symptoms and return if clinical change

## 2023-12-28 NOTE — Patient Instructions (Addendum)
 Continue to monitor your blood pressure.  Increase mirabegron  to 50mg  Stop if blood pressure maintains above 160/110 or if you develop any high blood pressure symptoms such as chest pain, shortness of breath, headache or change in vision.   Continue vaginal estrogen 1g twice a week.

## 2023-12-28 NOTE — Progress Notes (Signed)
 [facility] Return Visit  SUBJECTIVE  History of Present Illness: Olivia Carr is a 63 y.o. female seen in follow-up for Stage II pelvic organ prolapse, mixed urinary incontinence, vaginal atrophy, fecal incontinence, nocturia, SI pain, and history of rUTI. Plan at last visit was trial of gemtesa , vaginal estrogen, pelvic floor PT.   Gemtesa  with relief, not covered by insurance. While on gemtesa , denies leakage or nocturia while traveling in [place].  Mirabegron  25mg  with 60-70% relief, now mostly bothered by unpredictable nature of urgency. Return of nocturia 1x/night Reports BP 150s/110s at bedtime with intermittent increase in tinnitus symptoms Leaks urine with cough/ sneeze and with urgency, UTIs, respiratory meds and steroids, incomplete voiding Leaks 1/2x/wk down from 4x/wk with urgency, more bothersome Leaks 4-5x/day when she has URI or asthma attack around 3x/year, lasts around 2-4 w\ks Pad use: 1-2 down from 1-3 pads per day.  Stopped night time pad use. Day time voids 8 with timed voids.  Nocturia: 1x/night reduced from 1-2 times per night to void.  Drinks: 64oz water per day, reduced to 8oz coffee or soda  Noted decaf green tea as trigger Stops fluid around 9-9:30pm, sleeps around 11:30-midnight Started pelvic floor PT, dry needling, SI belt, and sleeping with a pillow between her legs with relief Reports reduction of vaginal bulge symptoms. Lost 5lbs  Denies UTI symptoms or treatments Reports improved bowel consistency without straining.   Past Medical History: Patient  has a past medical history of Allergic rhinitis, Asthma, Benign tumor of pineal gland (HCC), DUB (dysfunctional uterine bleeding), GERD (gastroesophageal reflux disease), History of bone density study (03-22-13), Hypertension, NSVD (normal spontaneous vaginal delivery), Pneumonia, and Routine gynecological examination.   Past Surgical History: She  has a past surgical history that  includes Tonsillectomy; Dilation and curettage of uterus; Wisdom tooth extraction; Anterior cruciate ligament repair; Abdominal hysterectomy; Myomectomy abdominal approach; Colonoscopy (05/06/2021); Knee arthroscopy (Left, 2018); Radial head arthroplasty (Left, 08/24/2019); Ligament repair (Left, 08/24/2019); EUSTACHIAN TUBE DILATION; and Colonoscopy (2025).   Medications: She has a current medication list which includes the following prescription(s): albuterol , calcium carbonate-vit d-min, cetirizine, diclofenac  sodium, epinephrine , esomeprazole , estradiol , fluticasone , metoprolol  succinate, mirabegron  er, montelukast , and potassium chloride .   Allergies: Patient is allergic to nitrofurantoin, other, and sulfa antibiotics.   Social History: Patient  reports that she has never smoked. She has never used smokeless tobacco. She reports current alcohol use of about 7.0 standard drinks of alcohol per week. She reports that she does not use drugs.     OBJECTIVE     Physical Exam: Vitals:   12/28/23 0907  BP: 134/84  Pulse: 82   Gen: No apparent distress, A&O x 3.  Detailed Urogynecologic Evaluation:  Deferred.       ASSESSMENT AND PLAN    Ms. Stringfield is a 63 y.o. with:  1. History of recurrent UTI (urinary tract infection)   2. Urinary incontinence, mixed   3. Pelvic organ prolapse quantification stage 2 rectocele   4. Vaginal atrophy   5. Nocturia   6. SI (sacroiliac) pain   7. Full incontinence of feces     History of recurrent UTI (urinary tract infection) Assessment & Plan: - For treatment of recurrent urinary tract infections, we discussed diagnosis of UTI and encouraged office testing with urine culture to r/o LUTS vs. Resistant uropathogen. - management of recurrent UTIs including prophylaxis with a daily low dose antibiotic, transvaginal estrogen therapy, D-mannose, and cranberry supplements.  We discussed the role of diagnostic testing such as  cystoscopy and upper tract  imaging.   - continue low dose vaginal estrogen and reassess symptoms - continue pyridium  or Ibuprofen use as needed with UTI symptoms and return if clinical change   Urinary incontinence, mixed Assessment & Plan: - 09/25/23 POCT UA + uro, PVR 88mL - urgency > stress, positive CST with minimal SUI symptoms only during URI - We discussed the symptoms of overactive bladder (OAB), which include urinary urgency, urinary frequency, nocturia, with or without urge incontinence.  While we do not know the exact etiology of OAB, several treatment options exist. We discussed management including behavioral therapy (decreasing bladder irritants, urge suppression strategies, timed voids, bladder retraining), physical therapy, medication; for refractory cases posterior tibial nerve stimulation, sacral neuromodulation, and intravesical botulinum toxin injection.  For anticholinergic medications, we discussed the potential side effects of anticholinergics including dry eyes, dry mouth, constipation, cognitive impairment and urinary retention. For Beta-3 agonist medication, we discussed the potential side effect of elevated blood pressure which is more likely to occur in individuals with uncontrolled hypertension. - relief with Gemtesa , not covered by insurance and requires appeal - Mirabegron  25mg  with 60-70% relief, advised to continue to monitor night time BP and start diary for review with PCP visit in 1 week. Increase to 50mg  if BP < 160/110, however reviewed signs and symptoms of HTN that warrants discontinuation and immediate evaluation - discussed avoidance of anti-cholinergics due to potassium supplementation - encouraged to conitnue fluid management, caffeine reduction and continue weight reduction - continue pelvic floor PT due to relief - For treatment of stress urinary incontinence,  non-surgical options include expectant management, weight loss, physical therapy, as well as a pessary.  Surgical options  include a midurethral sling, Burch urethropexy, and transurethral injection of a bulking agent. - continue low dose vaginal estrogen  Orders: -     Mirabegron  ER; Take 1 tablet (50 mg total) by mouth daily.  Dispense: 30 tablet; Refill: 2  Pelvic organ prolapse quantification stage 2 rectocele Assessment & Plan: - minimal symptoms noted since onset of symptoms  - For treatment of pelvic organ prolapse, we discussed options for management including expectant management, conservative management, and surgical management, such as Kegels, a pessary, pelvic floor physical therapy, and specific surgical procedures. - reduction of bulge symptoms since pelvic floor PT - continue low dose vaginal estrogen - encouraged to continue weight reduction and splinting  PRN bladder and bowel symptoms   Vaginal atrophy Assessment & Plan: - For symptomatic vaginal atrophy options include lubrication with a water-based lubricant, personal hygiene measures and barrier protection against wetness, and estrogen replacement in the form of vaginal cream, vaginal tablets, or a time-released vaginal ring.   - continue low dose vaginal estrogen 1g BID due to relief   Nocturia Assessment & Plan: - resolved with Gemtesa  after 2 weeks, not covered by insurance - continues 1x/night on mirabegron  25mg , increase to 50mg  pending BP monitoring - will proceed with appeal for Gemtesa  - avoid fluid intake 3 hours before bedtime - elevated feet during the day or use compression socks to reduce lower extremity swelling - switch bedtime dosing of medications to dinnertime - due to snoring, consider sleep study to r/o sleep apnea    SI (sacroiliac) pain Assessment & Plan: - encouraged to continue pelvic binder, weight reduction and sleep with pillow between her legs on her side due to relief - encouraged Voltaren  gel up to 2g 4x/day PRN pain - The origin of muscle spasm can be multifactorial, including primary,  reactive to a  different pain source, trauma, or even part of a centralized pain syndrome.Treatment options include physical therapy, local or oral pain meds, injections or centrally acting pain medications.   - continue pelvic floor PT   Full incontinence of feces Assessment & Plan: - h/o 3rd degree laceration repaired with forceps and VAVD - Treatment options include anti-diarrhea medication (loperamide/ Imodium OTC or prescription lomotil), fiber supplements, physical therapy, and possible sacral neuromodulation or surgery.   - encouraged fiber supplementation and titration as needed to optimize stool consistency - continue pelvic floor PT due to improvement    Time spent: I spent 32 minutes dedicated to the care of this patient on the date of this encounter to include pre-visit review of records, face-to-face time with the patient discussing stage II pelvic organ prolapse, mixed urinary incontinence, vaginal atrophy, fecal incontinence, nocturia, SI pain, history of rUTI, and post visit documentation and ordering medication/ testing.   Lianne ONEIDA Gillis, MD

## 2023-12-30 ENCOUNTER — Encounter: Payer: Self-pay | Admitting: Physical Therapy

## 2023-12-30 ENCOUNTER — Ambulatory Visit: Attending: Obstetrics | Admitting: Physical Therapy

## 2023-12-30 DIAGNOSIS — R278 Other lack of coordination: Secondary | ICD-10-CM | POA: Diagnosis present

## 2023-12-30 DIAGNOSIS — M6281 Muscle weakness (generalized): Secondary | ICD-10-CM | POA: Insufficient documentation

## 2023-12-30 NOTE — Therapy (Signed)
 OUTPATIENT PHYSICAL THERAPY FEMALE PELVIC TREATMENT   Patient Name: Olivia Carr MRN: [id number] DOB:[birth year], 63 y.o., female Today's Date: 12/30/2023  END OF SESSION:  PT End of Session - 12/30/23 1150     Visit Number 12    Date for Recertification  03/01/24    Authorization Type [employer]    PT Start Time 1145    PT Stop Time 1225    PT Time Calculation (min) 40 min    Activity Tolerance Patient tolerated treatment well    Behavior During Therapy WFL for tasks assessed/performed          Past Medical History:  Diagnosis Date   Allergic rhinitis    sees Dr. Frutoso    Asthma    sees Dr. Frutoso    Benign tumor of pineal gland (HCC)     last MRI 10-10-09, stable, no follow up planned   DUB (dysfunctional uterine bleeding)    GERD (gastroesophageal reflux disease)    History of bone density study 03-22-13   normal    Hypertension    NSVD (normal spontaneous vaginal delivery)    X2   Pneumonia    Routine gynecological examination    sees Dr. Curlee Guan    Past Surgical History:  Procedure Laterality Date   ABDOMINAL HYSTERECTOMY     LAVH   ANTERIOR CRUCIATE LIGAMENT REPAIR     right knee with medial and lateral meniscectomies 09/04/08   COLONOSCOPY  05/06/2021   per Dr. Julianne Gwen), benign polyps, repeat in 5 yrs   COLONOSCOPY  2025   DILATION AND CURETTAGE OF UTERUS     x 2   EUSTACHIAN TUBE DILATION     KNEE ARTHROSCOPY Left 2018   LIGAMENT REPAIR Left 08/24/2019   Procedure: LIGAMENT REPAIR;  Surgeon: Cristy Bonner DASEN, MD;  Location: [facility];  Service: Orthopedics;  Laterality: Left;   MYOMECTOMY ABDOMINAL APPROACH     RADIAL HEAD ARTHROPLASTY Left 08/24/2019   Procedure: RADIAL HEAD ARTHROPLASTY;  Surgeon: Cristy Bonner DASEN, MD;  Location: [facility];  Service: Orthopedics;  Laterality: Left;   TONSILLECTOMY     WISDOM TOOTH EXTRACTION     Patient Active Problem List   Diagnosis Date Noted   Urinary  incontinence, mixed 09/25/2023   Pelvic organ prolapse quantification stage 2 rectocele 09/25/2023   Vaginal atrophy 09/25/2023   Incontinence of feces 09/25/2023   Nocturia 09/25/2023   History of recurrent UTI (urinary tract infection) 09/25/2023   COVID-19 virus infection 02/06/2020   SI (sacroiliac) pain 03/03/2014   Anxiety 01/19/2013   Perimenopause 01/19/2012   Acute upper respiratory infection 04/06/2010   Asthma with exacerbation 04/06/2010   BENIGN NEOPLASM OF PINEAL GLAND 09/24/2009   GASTROENTERITIS 07/13/2009   SORE THROAT 07/05/2009   FEVER UNSPECIFIED 07/05/2009   ASTHMA 01/22/2008   HYPERTENSION 10/12/2006   ALLERGIC RHINITIS 10/12/2006   GERD 10/08/2006   PNEUMONIA, HX OF 10/08/2006    PCP: Johnny garnette LABOR, MD  REFERRING PROVIDER: Guadlupe Lianne DASEN, MD   REFERRING DIAG:  N39.46 (ICD-10-CM) - Urinary incontinence, mixed  N81.6 (ICD-10-CM) - Pelvic organ prolapse quantification stage 2 rectocele  R15.9 (ICD-10-CM) - Incontinence of feces, unspecified fecal incontinence type    THERAPY DIAG:  Other lack of coordination  Muscle weakness (generalized)  Rationale for Evaluation and Treatment: Rehabilitation  ONSET DATE: 2025  SUBJECTIVE:  SUBJECTIVE STATEMENT: I saw Dr. Guadlupe and increased my medication dose. I took the new dose last night. I woke up 1 time last night. I had a massage this morning.  Fluid intake: water, soda, coffee, juice  PAIN:  Are you having pain? No  PRECAUTIONS: None  RED FLAGS: None   WEIGHT BEARING RESTRICTIONS: No  FALLS:  Has patient fallen in last 6 months? No  OCCUPATION: retired  ACTIVITY LEVEL : gym 2 days per week for full body workout, walking 1-2 times per week  PLOF: Independent  PATIENT GOALS: reduce leakage, manage prolapse,  reduce frequency  PERTINENT HISTORY:  Hypertension; Myomectomy with abdominal approach; Abdominal Hysterctomy Sexual abuse: No  BOWEL MOVEMENT: Pain with bowel movement: No Type of bowel movement:Strain none Fully empty rectum: Yes: goes multiple times per day   URINATION: Pain with urination: No Fully empty bladder: Yes: due to having to go several times, stand up then has to urinate Stream: Strong Urgency: Yes , has to gt to the bathroom quickly Frequency: day-10 times; night- 2 times Leakage: Urge to void, Walking to the bathroom, and Coughing Pads: Yes: 1 per day for security  INTERCOURSE:  Ability to have vaginal penetration Yes  Pain with intercourse: none  PREGNANCY: Vaginal deliveries 2 Tearing Yes: 3rd degree tear  PROLAPSE: Pressure and Bulge   OBJECTIVE:  Note: Objective measures were completed at Evaluation unless otherwise noted.  DIAGNOSTIC FINDINGS:  none  PATIENT SURVEYS:  PFIQ-7: 33 UIQ-7 33  COGNITION: Overall cognitive status: Within functional limits for tasks assessed     SENSATION: Light touch:    FUNCTIONAL TESTS:  Single leg stance : right 3 sec, left 4 sec   POSTURE: rounded shoulders, forward head, increased thoracic kyphosis, and posterior pelvic tilt   LUMBARAROM/PROM:  A/PROM A/PROM  eval 11/23/23  Flexion full full  Extension Decreased by 25% full  Right lateral flexion Decreased by 25% Decreased by 25%  Left lateral flexion Decreased by 25% Decreased by 25%  Right rotation Decreased by 25% full  Left rotation Decreased by 25% full   (Blank rows = not tested)  LOWER EXTREMITY ROM:  Passive ROM Right eval Left eval Right/Left 12/21/23  Hip internal rotation 20 0 20   (Blank rows = not tested)  LOWER EXTREMITY MMT:  MMT Right eval Left eval Right 12/14/23 Left 12/14/23  Hip flexion 5/5 4/5 5/5 5/5  Hip extension 4/5 4/5 4/5 4/5  Hip abduction 4/5 4/5 4+/5 4+/5   (Blank rows = not tested) PALPATION:   Pelvic Alignment: ASIS are equal  Abdominal: difficulty with contracting the abdominals                External Perineal Exam: scar along the perineal body is restricted                              Internal Pelvic Floor: tightness along the posterior vaginal canal  Patient confirms identification and approves PT to assess internal pelvic floor and treatment Yes No emotional/communication barriers or cognitive limitation. Patient is motivated to learn. Patient understands and agrees with treatment goals and plan. PT explains patient will be examined in standing, sitting, and lying down to see how their muscles and joints work. When they are ready, they will be asked to remove their underwear so PT can examine their perineum. The patient is also given the option of providing their own chaperone as one is not provided  in our facility. The patient also has the right and is explained the right to defer or refuse any part of the evaluation or treatment including the internal exam. With the patient's consent, PT will use one gloved finger to gently assess the muscles of the pelvic floor, seeing how well it contracts and relaxes and if there is muscle symmetry. After, the patient will get dressed and PT and patient will discuss exam findings and plan of care. PT and patient discuss plan of care, schedule, attendance policy and HEP activities.   PELVIC MMT:   MMT eval 10/09/23 11/30/23 12/07/23  Vaginal 1/5 with gluteal contraction 2/5 2/5 with slight hug of therapist;  2/5  (Blank rows = not tested)        TONE: Average tone of pelvic floor  PROLAPSE: Posterior wall weakness just above the introitus; anterior wall weakness just above the introitus  TODAY'S TREATMENT:  12/30/23 Manual: Soft tissue mobilization: Manual work along the perineal body, superior transverse perineum, along the ischiocavernosus, bulbocavernosus and urogenital diaphragm to lengthen the tissue and reduce urinary  urgency Neuromuscular re-education: Down training: TENS unit to left foot for urgency on the arch and medial malleolu, 12 hs, 200 ua x 30 minutes    12/21/23 Exercises: Stretches/mobility: Assisted stretches for the hip adductors, hip internal rotation, piriformis stretch, hip flexor stretch in supine Siting happy baby with extra stretch to the lumbar area Long sit hamstring stretch holding 30 sec  Strengthening: Bridge 2 x but increased in posterior left knee pain Standing hip abduction with foot on slider working the pelvic floor 10 x each side    12/18/23 Manual: Soft tissue mobilization: Manual work to the left hip adductors, left quadriceps, left rectus femoris to elongate the tissue Myofascial release: Fascial release of the anterior left hip musculature.  Internal pelvic floor techniques: No emotional/communication barriers or cognitive limitation. Patient is motivated to learn. Patient understands and agrees with treatment goals and plan. PT explains patient will be examined in standing, sitting, and lying down to see how their muscles and joints work. When they are ready, they will be asked to remove their underwear so PT can examine their perineum. The patient is also given the option of providing their own chaperone as one is not provided in our facility. The patient also has the right and is explained the right to defer or refuse any part of the evaluation or treatment including the internal exam. With the patient's consent, PT will use one gloved finger to gently assess the muscles of the pelvic floor, seeing how well it contracts and relaxes and if there is muscle symmetry. After, the patient will get dressed and PT and patient will discuss exam findings and plan of care. PT and patient discuss plan of care, schedule, attendance policy and HEP activities.  Therapist gloved hands with mobilizing the clitoris going through the different directions especially on the  left Mobilization of the clitoral hood Self-care: Educated patient on how to mobilize the clitoris, pull the clitoral hood off the clitoris on the model    PATIENT EDUCATION:  10/09/23 Education details: Access Code:   Person educated: Patient Education method: Explanation, Demonstration, Tactile cues, Verbal cues, and Handouts Education comprehension: verbalized understanding, returned demonstration, verbal cues required, tactile cues required, and needs further education  HOME EXERCISE PROGRAM: 10/09/23 Access Code: [email or web address] URL: [email or web address] Date: 10/09/2023 Prepared by: Channing Pereyra  Exercises - - Supine Heel Slide with Small Ball  - 1 x daily - 7  x weekly - 3 sets - 10 reps - Supine Bridge with Mini Swiss Ball Between Knees  - 1 x daily - 1 x weekly - 1 sets - 10 reps - Sidelying Hip Adduction  - 1 x daily - 3 x weekly - 1 sets - 10 reps - Standing Hip Adduction  - 1 x daily - 3 x weekly - 1 sets - 10 reps - Seated Abdominal Press into [employer]  - 1 x daily - 3 x weekly - 1 sets - 10 reps    ASSESSMENT:  CLINICAL IMPRESSION: Patient is a 63 y.o. female who was seen today for physical therapy  treatment for rectocele and urinary leakage. Patient woke up 1 time last night after taking the new dose of medication.  Patient had one episode of urinary leakage since last visit and was with urgency and significant leakage. Patient has tried the home TENS unit for urgency and see if it will help.  Patient will benefit from skilled therapy to improve pelvic floor strength and coordination to reduce leakage, urgency and manage her prolapse.   OBJECTIVE IMPAIRMENTS: decreased coordination, decreased ROM, decreased strength, increased fascial restrictions, and postural dysfunction.   ACTIVITY LIMITATIONS: continence and toileting  PARTICIPATION LIMITATIONS: cleaning, laundry, shopping, and community activity  PERSONAL FACTORS: Time since onset of  injury/illness/exacerbation and 1-2 comorbidities: Hypertension; Myomectomy with abdominal approach; Abdominal Hysterctomy are also affecting patient's functional outcome.   REHAB POTENTIAL: Excellent  CLINICAL DECISION MAKING: Evolving/moderate complexity  EVALUATION COMPLEXITY: Moderate   GOALS: Goals reviewed with patient? Yes  SHORT TERM GOALS: Target date: 10/28/23  Patient is independent with her initial HEP to improve continence.  Baseline: Goal status: Met 10/09/23  2.  Patient is able to perform diaphragmatic breathing to elongate the pelvic floor for urgency.  Baseline:  Goal status: Met 11/20/23  3.  Patient educated on the urge to void to reduce urgency when in pubic.  Baseline:  Goal status: Met 11/18/23  4.  Patient educated on what a prolapse is understand how to lay down with legs elevated to reduce prolapse during middle of day and after strenuous activity.  Baseline:  Goal status: Met 11/18/23   LONG TERM GOALS: Target date: 03/02/23  Patient independent with advanced HEP for core and pelvic floor to reduce urinary leakage and urgency.  Baseline:  Goal status: ongoing 12/30/23  2.  Patient educated on pressure management during daily activities to reduce strain on the prolapse and prevent further progression.  Baseline:  Goal status: Ongoing 11/23/23  3.  Patient is able to have the urge to urinate and slowly work to the bathroom without leaking urine due to using her urge suppression techniques.  Baseline:  Goal status: ongoing 11/23/23  4.  Patient is able to cough without leaking urine due to the ability to quickly contract the pelvic floor and pelvic floor strength is >/= 3/5.  Baseline:  Goal status: ongoing 11/23/23    PLAN:  PT FREQUENCY: 1-2x/week  PT DURATION: other: 5 months  PLANNED INTERVENTIONS: 97110-Therapeutic exercises, 97530- Therapeutic activity, 97112- Neuromuscular re-education, 97535- Self Care, [id number]- Manual therapy, [id number]-  Electrical stimulation (unattended), 20560 (1-2 muscles), 20561 (3+ muscles)- Dry Needling, Patient/Family education, Joint mobilization, Spinal mobilization, Cryotherapy, Moist heat, and Biofeedback  PLAN FOR NEXT SESSION:  tens unit for urgency, d dry needling to the ischiocavernosus, bulbocavernosus, perineal body  Channing Pereyra, PT 12/30/23 12:28 PM

## 2024-01-04 ENCOUNTER — Encounter: Admitting: Family Medicine

## 2024-01-05 ENCOUNTER — Encounter: Admitting: Family Medicine

## 2024-01-07 ENCOUNTER — Ambulatory Visit: Admitting: Family Medicine

## 2024-01-07 VITALS — BP 140/88 | HR 82 | Temp 97.7°F | Ht 65.0 in | Wt 212.0 lb

## 2024-01-07 DIAGNOSIS — Z0000 Encounter for general adult medical examination without abnormal findings: Secondary | ICD-10-CM | POA: Diagnosis not present

## 2024-01-07 LAB — HEPATIC FUNCTION PANEL
ALT: 14 U/L (ref 0–35)
AST: 18 U/L (ref 0–37)
Albumin: 4.2 g/dL (ref 3.5–5.2)
Alkaline Phosphatase: 66 U/L (ref 39–117)
Bilirubin, Direct: 0.2 mg/dL (ref 0.0–0.3)
Total Bilirubin: 1.1 mg/dL (ref 0.2–1.2)
Total Protein: 7.2 g/dL (ref 6.0–8.3)

## 2024-01-07 LAB — CBC WITH DIFFERENTIAL/PLATELET
Basophils Absolute: 0.1 K/uL (ref 0.0–0.1)
Basophils Relative: 1.2 % (ref 0.0–3.0)
Eosinophils Absolute: 0.2 K/uL (ref 0.0–0.7)
Eosinophils Relative: 2.9 % (ref 0.0–5.0)
HCT: 40.5 % (ref 36.0–46.0)
Hemoglobin: 13.1 g/dL (ref 12.0–15.0)
Lymphocytes Relative: 27.2 % (ref 12.0–46.0)
Lymphs Abs: 1.9 K/uL (ref 0.7–4.0)
MCHC: 32.2 g/dL (ref 30.0–36.0)
MCV: 82.1 fl (ref 78.0–100.0)
Monocytes Absolute: 0.6 K/uL (ref 0.1–1.0)
Monocytes Relative: 8.5 % (ref 3.0–12.0)
Neutro Abs: 4.3 K/uL (ref 1.4–7.7)
Neutrophils Relative %: 60.2 % (ref 43.0–77.0)
Platelets: 312 K/uL (ref 150.0–400.0)
RBC: 4.94 Mil/uL (ref 3.87–5.11)
RDW: 15.1 % (ref 11.5–15.5)
WBC: 7.2 K/uL (ref 4.0–10.5)

## 2024-01-07 LAB — LIPID PANEL
Cholesterol: 199 mg/dL (ref 0–200)
HDL: 63.7 mg/dL (ref >39.00)
LDL Cholesterol: 116 mg/dL — ABNORMAL HIGH (ref 0–99)
NonHDL: 135.66
Total CHOL/HDL Ratio: 3
Triglycerides: 98 mg/dL (ref 0.0–149.0)
VLDL: 19.6 mg/dL (ref 0.0–40.0)

## 2024-01-07 LAB — BASIC METABOLIC PANEL WITH GFR
BUN: 17 mg/dL (ref 6–23)
CO2: 30 meq/L (ref 19–32)
Calcium: 9.5 mg/dL (ref 8.4–10.5)
Chloride: 104 meq/L (ref 96–112)
Creatinine, Ser: 0.73 mg/dL (ref 0.40–1.20)
GFR: 88.88 mL/min (ref >60.00)
Glucose, Bld: 97 mg/dL (ref 70–99)
Potassium: 4.1 meq/L (ref 3.5–5.1)
Sodium: 139 meq/L (ref 135–145)

## 2024-01-07 LAB — VITAMIN B12: Vitamin B-12: 245 pg/mL (ref 211–911)

## 2024-01-07 LAB — TSH: TSH: 1.47 u[IU]/mL (ref 0.35–5.50)

## 2024-01-07 LAB — HEMOGLOBIN A1C: Hgb A1c MFr Bld: 5.6 % (ref 4.6–6.5)

## 2024-01-07 MED ORDER — ESOMEPRAZOLE MAGNESIUM 40 MG PO CPDR: 40.0000 mg | DELAYED_RELEASE_CAPSULE | Freq: Every day | ORAL | 3 refills | Status: AC

## 2024-01-07 MED ORDER — POTASSIUM CHLORIDE CRYS ER 10 MEQ PO TBCR: 10.0000 meq | EXTENDED_RELEASE_TABLET | Freq: Every day | ORAL | 3 refills | Status: AC

## 2024-01-07 MED ORDER — METOPROLOL SUCCINATE ER 50 MG PO TB24: 50.0000 mg | ORAL_TABLET | Freq: Every day | ORAL | 3 refills | Status: AC

## 2024-01-07 NOTE — Progress Notes (Signed)
 Subjective:    Patient ID: Olivia Carr, female    DOB: 03-04-1960, 63 y.o.   MRN: [id number]  HPI Here for a well exam. She feels fine in general. She has been working closely with Dr, Lianne Gillis at [facility] for urinary incontinence, recurrent UTI, pelvic organ prolapse, rectocele, and vaginal atrophy. She has been taking Myrbetriq  and using vaginal estrogen cream. She has also been going to pelvic PT, and she says the PT has been very helpful. Her BP has been creeping up at home. Her systolic readings are often in the 140's or 150's.    Review of Systems  Constitutional: Negative.   HENT: Negative.    Eyes: Negative.   Respiratory: Negative.    Cardiovascular: Negative.   Gastrointestinal: Negative.   Genitourinary:  Negative for decreased urine volume, difficulty urinating, dyspareunia, dysuria, enuresis, flank pain, frequency, hematuria, pelvic pain and urgency.  Musculoskeletal: Negative.   Skin: Negative.   Neurological: Negative.  Negative for headaches.  Psychiatric/Behavioral: Negative.         Objective:   Physical Exam Constitutional:      General: She is not in acute distress.    Appearance: She is well-developed. She is obese.  HENT:     Head: Normocephalic and atraumatic.     Right Ear: External ear normal.     Left Ear: External ear normal.     Nose: Nose normal.     Mouth/Throat:     Pharynx: No oropharyngeal exudate.  Eyes:     General: No scleral icterus.    Conjunctiva/sclera: Conjunctivae normal.     Pupils: Pupils are equal, round, and reactive to light.  Neck:     Thyroid : No thyromegaly.     Vascular: No JVD.  Cardiovascular:     Rate and Rhythm: Normal rate and regular rhythm.     Pulses: Normal pulses.     Heart sounds: Normal heart sounds. No murmur heard.    No friction rub. No gallop.  Pulmonary:     Effort: Pulmonary effort is normal. No respiratory distress.     Breath sounds: Normal breath sounds. No wheezing or  rales.  Chest:     Chest wall: No tenderness.  Abdominal:     General: Bowel sounds are normal. There is no distension.     Palpations: Abdomen is soft. There is no mass.     Tenderness: There is no abdominal tenderness. There is no guarding or rebound.  Musculoskeletal:        General: No tenderness. Normal range of motion.     Cervical back: Normal range of motion and neck supple.  Lymphadenopathy:     Cervical: No cervical adenopathy.  Skin:    General: Skin is warm and dry.     Findings: No erythema or rash.  Neurological:     General: No focal deficit present.     Mental Status: She is alert and oriented to person, place, and time.     Cranial Nerves: No cranial nerve deficit.     Motor: No abnormal muscle tone.     Coordination: Coordination normal.     Deep Tendon Reflexes: Reflexes are normal and symmetric. Reflexes normal.  Psychiatric:        Mood and Affect: Mood normal.        Behavior: Behavior normal.        Thought Content: Thought content normal.        Judgment: Judgment normal.  Assessment & Plan:  Well exam. We discussed diet and exercise. Get fasting labs. For the HTN, we will increase the Metoprolol  succinate to 50 mg daily.  Garnette Olmsted, MD

## 2024-01-08 ENCOUNTER — Ambulatory Visit: Payer: Self-pay | Admitting: Family Medicine

## 2024-01-11 ENCOUNTER — Ambulatory Visit: Admitting: Physical Therapy

## 2024-01-11 ENCOUNTER — Encounter: Payer: Self-pay | Admitting: Physical Therapy

## 2024-01-11 DIAGNOSIS — R278 Other lack of coordination: Secondary | ICD-10-CM

## 2024-01-11 DIAGNOSIS — M6281 Muscle weakness (generalized): Secondary | ICD-10-CM

## 2024-01-11 NOTE — Therapy (Signed)
 OUTPATIENT PHYSICAL THERAPY FEMALE PELVIC TREATMENT   Patient Name: Olivia Carr MRN: [id number] DOB:Dec 16, 1960, 63 y.o., female Today's Date: 01/11/2024  END OF SESSION:  PT End of Session - 01/11/24 1104     Visit Number 13    Date for Recertification  03/01/24    Authorization Type [employer]    PT Start Time 1100    PT Stop Time 1140    PT Time Calculation (min) 40 min    Activity Tolerance Patient tolerated treatment well    Behavior During Therapy WFL for tasks assessed/performed          Past Medical History:  Diagnosis Date   Allergic rhinitis    sees Dr. Frutoso    Asthma    sees Dr. Frutoso    Benign tumor of pineal gland (HCC)     last MRI 10-10-09, stable, no follow up planned   DUB (dysfunctional uterine bleeding)    GERD (gastroesophageal reflux disease)    History of bone density study 03-22-13   normal    Hypertension    NSVD (normal spontaneous vaginal delivery)    X2   Pneumonia    Routine gynecological examination    sees Dr. Curlee Guan    Past Surgical History:  Procedure Laterality Date   ABDOMINAL HYSTERECTOMY     LAVH   ANTERIOR CRUCIATE LIGAMENT REPAIR     right knee with medial and lateral meniscectomies 09/04/08   COLONOSCOPY  05/06/2021   per Dr. Julianne Gwen), benign polyps, repeat in 5 yrs   DILATION AND CURETTAGE OF UTERUS     x 2   EUSTACHIAN TUBE DILATION     KNEE ARTHROSCOPY Left 2018   LIGAMENT REPAIR Left 08/24/2019   Procedure: LIGAMENT REPAIR;  Surgeon: Cristy Bonner DASEN, MD;  Location: [facility];  Service: Orthopedics;  Laterality: Left;   MYOMECTOMY ABDOMINAL APPROACH     RADIAL HEAD ARTHROPLASTY Left 08/24/2019   Procedure: RADIAL HEAD ARTHROPLASTY;  Surgeon: Cristy Bonner DASEN, MD;  Location: [facility];  Service: Orthopedics;  Laterality: Left;   TONSILLECTOMY     WISDOM TOOTH EXTRACTION     Patient Active Problem List   Diagnosis Date Noted   Urinary incontinence, mixed 09/25/2023    Pelvic organ prolapse quantification stage 2 rectocele 09/25/2023   Vaginal atrophy 09/25/2023   Incontinence of feces 09/25/2023   Nocturia 09/25/2023   History of recurrent UTI (urinary tract infection) 09/25/2023   COVID-19 virus infection 02/06/2020   SI (sacroiliac) pain 03/03/2014   Anxiety 01/19/2013   Perimenopause 01/19/2012   Acute upper respiratory infection 04/06/2010   Asthma with exacerbation 04/06/2010   BENIGN NEOPLASM OF PINEAL GLAND 09/24/2009   GASTROENTERITIS 07/13/2009   SORE THROAT 07/05/2009   FEVER UNSPECIFIED 07/05/2009   ASTHMA 01/22/2008   HYPERTENSION 10/12/2006   ALLERGIC RHINITIS 10/12/2006   GERD 10/08/2006   PNEUMONIA, HX OF 10/08/2006    PCP: Johnny garnette LABOR, MD  REFERRING PROVIDER: Guadlupe Lianne DASEN, MD   REFERRING DIAG:  N39.46 (ICD-10-CM) - Urinary incontinence, mixed  N81.6 (ICD-10-CM) - Pelvic organ prolapse quantification stage 2 rectocele  R15.9 (ICD-10-CM) - Incontinence of feces, unspecified fecal incontinence type    THERAPY DIAG:  Other lack of coordination  Muscle weakness (generalized)  Rationale for Evaluation and Treatment: Rehabilitation  ONSET DATE: 2025  SUBJECTIVE:  SUBJECTIVE STATEMENT: Increase in medication has mad a difference. I was able to sleep through the night. The urinary leakage from eval is 70% better.  The TENS stimulation last visit did not  help.  Fluid intake: water, soda, coffee, juice  PAIN:  Are you having pain? No  PRECAUTIONS: None  RED FLAGS: None   WEIGHT BEARING RESTRICTIONS: No  FALLS:  Has patient fallen in last 6 months? No  OCCUPATION: retired  ACTIVITY LEVEL : gym 2 days per week for full body workout, walking 1-2 times per week  PLOF: Independent  PATIENT GOALS: reduce leakage, manage  prolapse, reduce frequency  PERTINENT HISTORY:  Hypertension; Myomectomy with abdominal approach; Abdominal Hysterctomy Sexual abuse: No  BOWEL MOVEMENT: Pain with bowel movement: No Type of bowel movement:Strain none Fully empty rectum: Yes: goes multiple times per day   URINATION: Pain with urination: No Fully empty bladder: Yes: due to having to go several times, stand up then has to urinate Stream: Strong Urgency: Yes , has to gt to the bathroom quickly Frequency: day-10 times; night- 2 times Leakage: Urge to void, Walking to the bathroom, and Coughing Pads: Yes: 1 per day for security  INTERCOURSE:  Ability to have vaginal penetration Yes  Pain with intercourse: none  PREGNANCY: Vaginal deliveries 2 Tearing Yes: 3rd degree tear  PROLAPSE: Pressure and Bulge   OBJECTIVE:  Note: Objective measures were completed at Evaluation unless otherwise noted.  DIAGNOSTIC FINDINGS:  none  PATIENT SURVEYS:  PFIQ-7: 33 UIQ-7 33  COGNITION: Overall cognitive status: Within functional limits for tasks assessed     SENSATION: Light touch:    FUNCTIONAL TESTS:  Single leg stance : right 3 sec, left 4 sec   POSTURE: rounded shoulders, forward head, increased thoracic kyphosis, and posterior pelvic tilt   LUMBARAROM/PROM:  A/PROM A/PROM  eval 11/23/23  Flexion full full  Extension Decreased by 25% full  Right lateral flexion Decreased by 25% Decreased by 25%  Left lateral flexion Decreased by 25% Decreased by 25%  Right rotation Decreased by 25% full  Left rotation Decreased by 25% full   (Blank rows = not tested)  LOWER EXTREMITY ROM:  Passive ROM Right eval Left eval Right/Left 12/21/23  Hip internal rotation 20 0 20   (Blank rows = not tested)  LOWER EXTREMITY MMT:  MMT Right eval Left eval Right 12/14/23 Left 12/14/23  Hip flexion 5/5 4/5 5/5 5/5  Hip extension 4/5 4/5 4/5 4/5  Hip abduction 4/5 4/5 4+/5 4+/5   (Blank rows = not  tested) PALPATION:  Pelvic Alignment: ASIS are equal  Abdominal: difficulty with contracting the abdominals                External Perineal Exam: scar along the perineal body is restricted                              Internal Pelvic Floor: tightness along the posterior vaginal canal  Patient confirms identification and approves PT to assess internal pelvic floor and treatment Yes No emotional/communication barriers or cognitive limitation. Patient is motivated to learn. Patient understands and agrees with treatment goals and plan. PT explains patient will be examined in standing, sitting, and lying down to see how their muscles and joints work. When they are ready, they will be asked to remove their underwear so PT can examine their perineum. The patient is also given the option of providing their own chaperone as  one is not provided in our facility. The patient also has the right and is explained the right to defer or refuse any part of the evaluation or treatment including the internal exam. With the patient's consent, PT will use one gloved finger to gently assess the muscles of the pelvic floor, seeing how well it contracts and relaxes and if there is muscle symmetry. After, the patient will get dressed and PT and patient will discuss exam findings and plan of care. PT and patient discuss plan of care, schedule, attendance policy and HEP activities.   PELVIC MMT:   MMT eval 10/09/23 11/30/23 12/07/23  Vaginal 1/5 with gluteal contraction 2/5 2/5 with slight hug of therapist;  2/5  (Blank rows = not tested)        TONE: Average tone of pelvic floor  PROLAPSE: Posterior wall weakness just above the introitus; anterior wall weakness just above the introitus  TODAY'S TREATMENT:  01/11/24 Manual: Soft tissue mobilization: Manual work along the perineal body, superior transverse perineum, along the ischiocavernosus, bulbocavernosus and urogenital diaphragm to lengthen the tissue and  reduce urinary urgency Internal pelvic floor techniques: No emotional/communication barriers or cognitive limitation. Patient is motivated to learn. Patient understands and agrees with treatment goals and plan. PT explains patient will be examined in standing, sitting, and lying down to see how their muscles and joints work. When they are ready, they will be asked to remove their underwear so PT can examine their perineum. The patient is also given the option of providing their own chaperone as one is not provided in our facility. The patient also has the right and is explained the right to defer or refuse any part of the evaluation or treatment including the internal exam. With the patient's consent, PT will use one gloved finger to gently assess the muscles of the pelvic floor, seeing how well it contracts and relaxes and if there is muscle symmetry. After, the patient will get dressed and PT and patient will discuss exam findings and plan of care. PT and patient discuss plan of care, schedule, attendance policy and HEP activities.  Therapist gloved finger in the vaginal canal working on the left ischiocavernosus, bulbocavernosus, and levator ani to elongate the tissue and reduce the tightness    12/30/23 Manual: Soft tissue mobilization: Manual work along the perineal body, superior transverse perineum, along the ischiocavernosus, bulbocavernosus and urogenital diaphragm to lengthen the tissue and reduce urinary urgency Neuromuscular re-educati Down training: TENS unit to left foot for urgency on the arch and medial malleolu, 12 hs, 200 ua x 30 minutes    12/21/23 Exercises: Stretches/mobility: Assisted stretches for the hip adductors, hip internal rotation, piriformis stretch, hip flexor stretch in supine Siting happy baby with extra stretch to the lumbar area Long sit hamstring stretch holding 30 sec  Strengthening: Bridge 2 x but increased in posterior left knee pain Standing hip  abduction with foot on slider working the pelvic floor 10 x each side    PATIENT EDUCATION:  10/09/23 Education details: Access Code:   Person educated: Patient Education method: Programmer, Multimedia, Facilities Manager, Actor cues, Verbal cues, and Handouts Education comprehension: verbalized understanding, returned demonstration, verbal cues required, tactile cues required, and needs further education  HOME EXERCISE PROGRAM: 10/09/23 Access Code: [email or web address] URL: [email or web address] Date: 10/09/2023 Prepared by: Channing Pereyra  Exercises - - Supine Heel Slide with Small Ball  - 1 x daily - 7 x weekly - 3 sets - 10 reps - Supine Bridge with Mini Swiss [address]  Between Knees  - 1 x daily - 1 x weekly - 1 sets - 10 reps - Sidelying Hip Adduction  - 1 x daily - 3 x weekly - 1 sets - 10 reps - Standing Hip Adduction  - 1 x daily - 3 x weekly - 1 sets - 10 reps - Seated Abdominal Press into [employer]  - 1 x daily - 3 x weekly - 1 sets - 10 reps    ASSESSMENT:  CLINICAL IMPRESSION: Patient is a 63 y.o. female who was seen today for physical therapy  treatment for rectocele and urinary leakage. Patient woke up 1 time last night after taking the new dose of medication.  She reports her urinary  leakage since initial evaluation is 70% better. She has increased tightness of the left pelvic floor.  Patient will benefit from skilled therapy to improve pelvic floor strength and coordination to reduce leakage, urgency and manage her prolapse.   OBJECTIVE IMPAIRMENTS: decreased coordination, decreased ROM, decreased strength, increased fascial restrictions, and postural dysfunction.   ACTIVITY LIMITATIONS: continence and toileting  PARTICIPATION LIMITATIONS: cleaning, laundry, shopping, and community activity  PERSONAL FACTORS: Time since onset of injury/illness/exacerbation and 1-2 comorbidities: Hypertension; Myomectomy with abdominal approach; Abdominal Hysterctomy are also affecting patient's  functional outcome.   REHAB POTENTIAL: Excellent  CLINICAL DECISION MAKING: Evolving/moderate complexity  EVALUATION COMPLEXITY: Moderate   GOALS: Goals reviewed with patient? Yes  SHORT TERM GOALS: Target date: 10/28/23  Patient is independent with her initial HEP to improve continence.  Baseline: Goal status: Met 10/09/23  2.  Patient is able to perform diaphragmatic breathing to elongate the pelvic floor for urgency.  Baseline:  Goal status: Met 11/20/23  3.  Patient educated on the urge to void to reduce urgency when in pubic.  Baseline:  Goal status: Met 11/18/23  4.  Patient educated on what a prolapse is understand how to lay down with legs elevated to reduce prolapse during middle of day and after strenuous activity.  Baseline:  Goal status: Met 11/18/23   LONG TERM GOALS: Target date: 03/02/23  Patient independent with advanced HEP for core and pelvic floor to reduce urinary leakage and urgency.  Baseline:  Goal status: ongoing 12/30/23  2.  Patient educated on pressure management during daily activities to reduce strain on the prolapse and prevent further progression.  Baseline:  Goal status: Ongoing 11/23/23  3.  Patient is able to have the urge to urinate and slowly work to the bathroom without leaking urine due to using her urge suppression techniques.  Baseline:  Goal status: ongoing 11/23/23  4.  Patient is able to cough without leaking urine due to the ability to quickly contract the pelvic floor and pelvic floor strength is >/= 3/5.  Baseline:  Goal status: ongoing 11/23/23    PLAN:  PT FREQUENCY: 1-2x/week  PT DURATION: other: 5 months  PLANNED INTERVENTIONS: 97110-Therapeutic exercises, 97530- Therapeutic activity, 97112- Neuromuscular re-education, 97535- Self Care, [id number]- Manual therapy, [id number]- Electrical stimulation (unattended), 20560 (1-2 muscles), 20561 (3+ muscles)- Dry Needling, Patient/Family education, Joint mobilization, Spinal  mobilization, Cryotherapy, Moist heat, and Biofeedback  PLAN FOR NEXT SESSION:  see if she wants to do dry needling to the ischiocavernosus, bulbocavernosus, perineal body; work on left pelvic floor   Channing Pereyra, PT 01/11/2024 11:46 AM

## 2024-01-19 ENCOUNTER — Other Ambulatory Visit: Payer: Self-pay

## 2024-01-19 DIAGNOSIS — N3946 Mixed incontinence: Secondary | ICD-10-CM

## 2024-01-19 MED ORDER — MIRABEGRON ER 50 MG PO TB24: 50.0000 mg | ORAL_TABLET | Freq: Every day | ORAL | 1 refills | Status: AC

## 2024-02-01 ENCOUNTER — Encounter: Payer: Self-pay | Admitting: Physical Therapy

## 2024-02-01 ENCOUNTER — Ambulatory Visit: Attending: Obstetrics | Admitting: Physical Therapy

## 2024-02-01 DIAGNOSIS — R278 Other lack of coordination: Secondary | ICD-10-CM | POA: Diagnosis present

## 2024-02-01 DIAGNOSIS — M6281 Muscle weakness (generalized): Secondary | ICD-10-CM | POA: Diagnosis present

## 2024-02-01 NOTE — Therapy (Signed)
 " OUTPATIENT PHYSICAL THERAPY FEMALE PELVIC TREATMENT   Patient Name: Olivia Carr MRN: [id number] DOB:[birth year], 62 y.o., female Today's Date: 02/01/2024  END OF SESSION:  PT End of Session - 02/01/24 0800     Visit Number 14    Date for Recertification  03/01/24    Authorization Type [employer] ( DN signed 02/01/24)    PT Start Time 0800    PT Stop Time 0840    PT Time Calculation (min) 40 min    Activity Tolerance Patient tolerated treatment well    Behavior During Therapy WFL for tasks assessed/performed          Past Medical History:  Diagnosis Date   Allergic rhinitis    sees Dr. Frutoso    Asthma    sees Dr. Frutoso    Benign tumor of pineal gland (HCC)     last MRI 10-10-09, stable, no follow up planned   DUB (dysfunctional uterine bleeding)    GERD (gastroesophageal reflux disease)    History of bone density study 03-22-13   normal    Hypertension    NSVD (normal spontaneous vaginal delivery)    X2   Pneumonia    Routine gynecological examination    sees Dr. Curlee Guan    Past Surgical History:  Procedure Laterality Date   ABDOMINAL HYSTERECTOMY     LAVH   ANTERIOR CRUCIATE LIGAMENT REPAIR     right knee with medial and lateral meniscectomies 09/04/08   COLONOSCOPY  05/06/2021   per Dr. Julianne Gwen), benign polyps, repeat in 5 yrs   DILATION AND CURETTAGE OF UTERUS     x 2   EUSTACHIAN TUBE DILATION     KNEE ARTHROSCOPY Left 2018   LIGAMENT REPAIR Left 08/24/2019   Procedure: LIGAMENT REPAIR;  Surgeon: Cristy Bonner DASEN, MD;  Location: [facility];  Service: Orthopedics;  Laterality: Left;   MYOMECTOMY ABDOMINAL APPROACH     RADIAL HEAD ARTHROPLASTY Left 08/24/2019   Procedure: RADIAL HEAD ARTHROPLASTY;  Surgeon: Cristy Bonner DASEN, MD;  Location: [facility];  Service: Orthopedics;  Laterality: Left;   TONSILLECTOMY     WISDOM TOOTH EXTRACTION     Patient Active Problem List   Diagnosis Date Noted   Urinary incontinence,  mixed 09/25/2023   Pelvic organ prolapse quantification stage 2 rectocele 09/25/2023   Vaginal atrophy 09/25/2023   Incontinence of feces 09/25/2023   Nocturia 09/25/2023   History of recurrent UTI (urinary tract infection) 09/25/2023   COVID-19 virus infection 02/06/2020   SI (sacroiliac) pain 03/03/2014   Anxiety 01/19/2013   Perimenopause 01/19/2012   Acute upper respiratory infection 04/06/2010   Asthma with exacerbation 04/06/2010   BENIGN NEOPLASM OF PINEAL GLAND 09/24/2009   GASTROENTERITIS 07/13/2009   SORE THROAT 07/05/2009   FEVER UNSPECIFIED 07/05/2009   ASTHMA 01/22/2008   HYPERTENSION 10/12/2006   ALLERGIC RHINITIS 10/12/2006   GERD 10/08/2006   PNEUMONIA, HX OF 10/08/2006    PCP: Johnny garnette LABOR, MD  REFERRING PROVIDER: Guadlupe Lianne DASEN, MD   REFERRING DIAG:  N39.46 (ICD-10-CM) - Urinary incontinence, mixed  N81.6 (ICD-10-CM) - Pelvic organ prolapse quantification stage 2 rectocele  R15.9 (ICD-10-CM) - Incontinence of feces, unspecified fecal incontinence type    THERAPY DIAG:  Other lack of coordination  Muscle weakness (generalized)  Rationale for Evaluation and Treatment: Rehabilitation  ONSET DATE: 2025  SUBJECTIVE:  SUBJECTIVE STATEMENT: Overall I am doing better. I am having tightness in the hip.  Fluid intake: water, soda, coffee, juice  PAIN:  Are you having pain? No  PRECAUTIONS: None  RED FLAGS: None   WEIGHT BEARING RESTRICTIONS: No  FALLS:  Has patient fallen in last 6 months? No  OCCUPATION: retired  ACTIVITY LEVEL : gym 2 days per week for full body workout, walking 1-2 times per week  PLOF: Independent  PATIENT GOALS: reduce leakage, manage prolapse, reduce frequency  PERTINENT HISTORY:  Hypertension; Myomectomy with abdominal approach;  Abdominal Hysterctomy Sexual abuse: No  BOWEL MOVEMENT: Pain with bowel movement: No Type of bowel movement:Strain none Fully empty rectum: Yes: goes multiple times per day   URINATION: Pain with urination: No Fully empty bladder: Yes: due to having to go several times, stand up then has to urinate Stream: Strong Urgency: Yes , has to gt to the bathroom quickly Frequency: day-10 times; night- 2 times Leakage: Urge to void, Walking to the bathroom, and Coughing Pads: Yes: 1 per day for security  INTERCOURSE:  Ability to have vaginal penetration Yes  Pain with intercourse: none  PREGNANCY: Vaginal deliveries 2 Tearing Yes: 3rd degree tear  PROLAPSE: Pressure and Bulge   OBJECTIVE:  Note: Objective measures were completed at Evaluation unless otherwise noted.  DIAGNOSTIC FINDINGS:  none  PATIENT SURVEYS:  PFIQ-7: 33 UIQ-7 33  COGNITION: Overall cognitive status: Within functional limits for tasks assessed     SENSATION: Light touch:    FUNCTIONAL TESTS:  Single leg stance : right 3 sec, left 4 sec   POSTURE: rounded shoulders, forward head, increased thoracic kyphosis, and posterior pelvic tilt   LUMBARAROM/PROM:  A/PROM A/PROM  eval 11/23/23  Flexion full full  Extension Decreased by 25% full  Right lateral flexion Decreased by 25% Decreased by 25%  Left lateral flexion Decreased by 25% Decreased by 25%  Right rotation Decreased by 25% full  Left rotation Decreased by 25% full   (Blank rows = not tested)  LOWER EXTREMITY ROM:  Passive ROM Right eval Left eval Right/Left 12/21/23  Hip internal rotation 20 0 20   (Blank rows = not tested)  LOWER EXTREMITY MMT:  MMT Right eval Left eval Right 12/14/23 Left 12/14/23  Hip flexion 5/5 4/5 5/5 5/5  Hip extension 4/5 4/5 4/5 4/5  Hip abduction 4/5 4/5 4+/5 4+/5   (Blank rows = not tested) PALPATION:  Pelvic Alignment: ASIS are equal  Abdominal: difficulty with contracting the  abdominals                External Perineal Exam: scar along the perineal body is restricted                              Internal Pelvic Floor: tightness along the posterior vaginal canal  Patient confirms identification and approves PT to assess internal pelvic floor and treatment Yes No emotional/communication barriers or cognitive limitation. Patient is motivated to learn. Patient understands and agrees with treatment goals and plan. PT explains patient will be examined in standing, sitting, and lying down to see how their muscles and joints work. When they are ready, they will be asked to remove their underwear so PT can examine their perineum. The patient is also given the option of providing their own chaperone as one is not provided in our facility. The patient also has the right and is explained the right to defer or  refuse any part of the evaluation or treatment including the internal exam. With the patient's consent, PT will use one gloved finger to gently assess the muscles of the pelvic floor, seeing how well it contracts and relaxes and if there is muscle symmetry. After, the patient will get dressed and PT and patient will discuss exam findings and plan of care. PT and patient discuss plan of care, schedule, attendance policy and HEP activities.   PELVIC MMT:   MMT eval 10/09/23 11/30/23 12/07/23 02/01/24  Vaginal 1/5 with gluteal contraction 2/5 2/5 with slight hug of therapist;  2/5 2/5  (Blank rows = not tested)        TONE: Average tone of pelvic floor  PROLAPSE: Posterior wall weakness just above the introitus; anterior wall weakness just above the introitus  TODAY'S TREATMENT:  02/01/2024 Manual: Internal pelvic floor techniques: No emotional/communication barriers or cognitive limitation. Patient is motivated to learn. Patient understands and agrees with treatment goals and plan. PT explains patient will be examined in standing, sitting, and lying down to see how their  muscles and joints work. When they are ready, they will be asked to remove their underwear so PT can examine their perineum. The patient is also given the option of providing their own chaperone as one is not provided in our facility. The patient also has the right and is explained the right to defer or refuse any part of the evaluation or treatment including the internal exam. With the patient's consent, PT will use one gloved finger to gently assess the muscles of the pelvic floor, seeing how well it contracts and relaxes and if there is muscle symmetry. After, the patient will get dressed and PT and patient will discuss exam findings and plan of care. PT and patient discuss plan of care, schedule, attendance policy and HEP activities.  Therapist gloved finger in the vaginal canal working on the levator ani and obturator internist to elongate the muscles Dry needling: Trigger Point Dry Needling  Initial Treatment: Pt instructed on Dry Needling rational, procedures, and possible side effects. Pt instructed to expect mild to moderate muscle soreness later in the day and/or into the next day.  Pt instructed in methods to reduce muscle soreness. Pt instructed to continue prescribed HEP. Because Dry Needling was performed over or adjacent to a lung field, pt was educated on S/S of pneumothorax and to seek immediate medical attention should they occur.  Patient was educated on signs and symptoms of infection and other risk factors and advised to seek medical attention should they occur.  Patient verbalized understanding of these instructions and education.   Patient Verbal Consent Given: Yes Education Handout Provided: Yes Muscles Treated: ischiocavernosus, perineal body Electrical Stimulation Performed: No Treatment Response/Outcome: elongation of muscle and trigger point restsponse  Neuromuscular re-education: Pelvic floor contraction training: Therapist gloved finger in the vaginal canal giving  tactile and verbal cues to contract the pelvic floor without adding the hip adductors and gluteals.  Self-care: Educated patient on dry needling and where we would dry needle and the effects we want to see    01/11/24 Manual: Soft tissue mobilization: Manual work along the perineal body, superior transverse perineum, along the ischiocavernosus, bulbocavernosus and urogenital diaphragm to lengthen the tissue and reduce urinary urgency Internal pelvic floor techniques: No emotional/communication barriers or cognitive limitation. Patient is motivated to learn. Patient understands and agrees with treatment goals and plan. PT explains patient will be examined in standing, sitting, and lying down to see  how their muscles and joints work. When they are ready, they will be asked to remove their underwear so PT can examine their perineum. The patient is also given the option of providing their own chaperone as one is not provided in our facility. The patient also has the right and is explained the right to defer or refuse any part of the evaluation or treatment including the internal exam. With the patient's consent, PT will use one gloved finger to gently assess the muscles of the pelvic floor, seeing how well it contracts and relaxes and if there is muscle symmetry. After, the patient will get dressed and PT and patient will discuss exam findings and plan of care. PT and patient discuss plan of care, schedule, attendance policy and HEP activities.  Therapist gloved finger in the vaginal canal working on the left ischiocavernosus, bulbocavernosus, and levator ani to elongate the tissue and reduce the tightness    12/30/23 Manual: Soft tissue mobilization: Manual work along the perineal body, superior transverse perineum, along the ischiocavernosus, bulbocavernosus and urogenital diaphragm to lengthen the tissue and reduce urinary urgency Neuromuscular re-educati Down training: TENS unit to left foot for  urgency on the arch and medial malleolu, 12 hs, 200 ua x 30 minutes    PATIENT EDUCATION:  10/09/23 Education details: Access Code:   Person educated: Patient Education method: Programmer, Multimedia, Facilities Manager, Actor cues, Verbal cues, and Handouts Education comprehension: verbalized understanding, returned demonstration, verbal cues required, tactile cues required, and needs further education  HOME EXERCISE PROGRAM: 10/09/23 Access Code: [email or web address] URL: [email or web address] Date: 10/09/2023 Prepared by: Channing Pereyra  Exercises - - Supine Heel Slide with Small Ball  - 1 x daily - 7 x weekly - 3 sets - 10 reps - Supine Bridge with Mini Swiss Ball Between Knees  - 1 x daily - 1 x weekly - 1 sets - 10 reps - Sidelying Hip Adduction  - 1 x daily - 3 x weekly - 1 sets - 10 reps - Standing Hip Adduction  - 1 x daily - 3 x weekly - 1 sets - 10 reps - Seated Abdominal Press into [employer]  - 1 x daily - 3 x weekly - 1 sets - 10 reps    ASSESSMENT:  CLINICAL IMPRESSION: Patient is a 64 y.o. female who was seen today for physical therapy  treatment for rectocele and urinary leakage. Patient responded with the dry needling well. She was able to feel less tightness in the pelvic floor and left hip after the dry needling.  She still needs tactile cues to contract the pelvic floor and not the other muscles. She is doing better with sleeping through the night due to taking the medication. Patient will benefit from skilled therapy to improve pelvic floor strength and coordination to reduce leakage, urgency and manage her prolapse.   OBJECTIVE IMPAIRMENTS: decreased coordination, decreased ROM, decreased strength, increased fascial restrictions, and postural dysfunction.   ACTIVITY LIMITATIONS: continence and toileting  PARTICIPATION LIMITATIONS: cleaning, laundry, shopping, and community activity  PERSONAL FACTORS: Time since onset of injury/illness/exacerbation and 1-2 comorbidities:  Hypertension; Myomectomy with abdominal approach; Abdominal Hysterctomy are also affecting patient's functional outcome.   REHAB POTENTIAL: Excellent  CLINICAL DECISION MAKING: Evolving/moderate complexity  EVALUATION COMPLEXITY: Moderate   GOALS: Goals reviewed with patient? Yes  SHORT TERM GOALS: Target date: 10/28/23  Patient is independent with her initial HEP to improve continence.  Baseline: Goal status: Met 10/09/23  2.  Patient is able to perform diaphragmatic breathing to  elongate the pelvic floor for urgency.  Baseline:  Goal status: Met 11/20/23  3.  Patient educated on the urge to void to reduce urgency when in pubic.  Baseline:  Goal status: Met 11/18/23  4.  Patient educated on what a prolapse is understand how to lay down with legs elevated to reduce prolapse during middle of day and after strenuous activity.  Baseline:  Goal status: Met 11/18/23   LONG TERM GOALS: Target date: 03/02/23  Patient independent with advanced HEP for core and pelvic floor to reduce urinary leakage and urgency.  Baseline:  Goal status: ongoing 12/30/23  2.  Patient educated on pressure management during daily activities to reduce strain on the prolapse and prevent further progression.  Baseline:  Goal status: Ongoing 11/23/23  3.  Patient is able to have the urge to urinate and slowly work to the bathroom without leaking urine due to using her urge suppression techniques.  Baseline:  Goal status: ongoing 11/23/23  4.  Patient is able to cough without leaking urine due to the ability to quickly contract the pelvic floor and pelvic floor strength is >/= 3/5.  Baseline:  Goal status: ongoing 11/23/23    PLAN:  PT FREQUENCY: 1-2x/week  PT DURATION: other: 5 months  PLANNED INTERVENTIONS: 97110-Therapeutic exercises, 97530- Therapeutic activity, 97112- Neuromuscular re-education, 97535- Self Care, [id number]- Manual therapy, [id number]- Electrical stimulation (unattended), 20560 (1-2  muscles), 20561 (3+ muscles)- Dry Needling, Patient/Family education, Joint mobilization, Spinal mobilization, Cryotherapy, Moist heat, and Biofeedback  PLAN FOR NEXT SESSION:  see if she needs  dry needling to the ischiocavernosus, bulbocavernosus, perineal body; work on left pelvic floor , contract of the pelvic floor  Channing Pereyra, PT 02/01/2024 8:46 AM  "

## 2024-02-08 ENCOUNTER — Encounter: Payer: Self-pay | Admitting: *Deleted

## 2024-02-10 ENCOUNTER — Ambulatory Visit: Admitting: Physical Therapy

## 2024-02-10 ENCOUNTER — Encounter: Payer: Self-pay | Admitting: Physical Therapy

## 2024-02-10 DIAGNOSIS — R278 Other lack of coordination: Secondary | ICD-10-CM | POA: Diagnosis not present

## 2024-02-10 DIAGNOSIS — M6281 Muscle weakness (generalized): Secondary | ICD-10-CM

## 2024-02-10 NOTE — Therapy (Signed)
 " OUTPATIENT PHYSICAL THERAPY FEMALE PELVIC TREATMENT   Patient Name: Olivia Carr MRN: [id number] DOB:July 28, 1960, 64 y.o., female Today's Date: 02/10/2024  END OF SESSION:  PT End of Session - 02/10/24 0805     Visit Number 15    Date for Recertification  03/01/24    Authorization Type [employer] ( DN signed 02/01/24)    PT Start Time 0800    PT Stop Time 0840    PT Time Calculation (min) 40 min    Activity Tolerance Patient tolerated treatment well    Behavior During Therapy WFL for tasks assessed/performed          Past Medical History:  Diagnosis Date   Allergic rhinitis    sees Dr. Frutoso    Asthma    sees Dr. Frutoso    Benign tumor of pineal gland (HCC)     last MRI 10-10-09, stable, no follow up planned   DUB (dysfunctional uterine bleeding)    GERD (gastroesophageal reflux disease)    History of bone density study 03-22-13   normal    Hypertension    NSVD (normal spontaneous vaginal delivery)    X2   Pneumonia    Routine gynecological examination    sees Dr. Curlee Guan    Past Surgical History:  Procedure Laterality Date   ABDOMINAL HYSTERECTOMY     LAVH   ANTERIOR CRUCIATE LIGAMENT REPAIR     right knee with medial and lateral meniscectomies 09/04/08   COLONOSCOPY  05/06/2021   per Dr. Julianne Gwen), benign polyps, repeat in 5 yrs   DILATION AND CURETTAGE OF UTERUS     x 2   EUSTACHIAN TUBE DILATION     KNEE ARTHROSCOPY Left 2018   LIGAMENT REPAIR Left 08/24/2019   Procedure: LIGAMENT REPAIR;  Surgeon: Cristy Bonner DASEN, MD;  Location: [facility];  Service: Orthopedics;  Laterality: Left;   MYOMECTOMY ABDOMINAL APPROACH     RADIAL HEAD ARTHROPLASTY Left 08/24/2019   Procedure: RADIAL HEAD ARTHROPLASTY;  Surgeon: Cristy Bonner DASEN, MD;  Location: [facility];  Service: Orthopedics;  Laterality: Left;   TONSILLECTOMY     WISDOM TOOTH EXTRACTION     Patient Active Problem List   Diagnosis Date Noted   Urinary  incontinence, mixed 09/25/2023   Pelvic organ prolapse quantification stage 2 rectocele 09/25/2023   Vaginal atrophy 09/25/2023   Incontinence of feces 09/25/2023   Nocturia 09/25/2023   History of recurrent UTI (urinary tract infection) 09/25/2023   COVID-19 virus infection 02/06/2020   SI (sacroiliac) pain 03/03/2014   Anxiety 01/19/2013   Perimenopause 01/19/2012   Acute upper respiratory infection 04/06/2010   Asthma with exacerbation 04/06/2010   BENIGN NEOPLASM OF PINEAL GLAND 09/24/2009   GASTROENTERITIS 07/13/2009   SORE THROAT 07/05/2009   FEVER UNSPECIFIED 07/05/2009   ASTHMA 01/22/2008   HYPERTENSION 10/12/2006   ALLERGIC RHINITIS 10/12/2006   GERD 10/08/2006   PNEUMONIA, HX OF 10/08/2006    PCP: Johnny garnette LABOR, MD  REFERRING PROVIDER: Guadlupe Lianne DASEN, MD   REFERRING DIAG:  N39.46 (ICD-10-CM) - Urinary incontinence, mixed  N81.6 (ICD-10-CM) - Pelvic organ prolapse quantification stage 2 rectocele  R15.9 (ICD-10-CM) - Incontinence of feces, unspecified fecal incontinence type    THERAPY DIAG:  Other lack of coordination  Muscle weakness (generalized)  Rationale for Evaluation and Treatment: Rehabilitation  ONSET DATE: 2025  SUBJECTIVE:  SUBJECTIVE STATEMENT: I have slept through the night 2 times. I had 1 leakage yesterday but that has been in a long time.  Fluid intake: water, soda, coffee, juice  PAIN:  Are you having pain? No  PRECAUTIONS: None  RED FLAGS: None   WEIGHT BEARING RESTRICTIONS: No  FALLS:  Has patient fallen in last 6 months? No  OCCUPATION: retired  ACTIVITY LEVEL : gym 2 days per week for full body workout, walking 1-2 times per week  PLOF: Independent  PATIENT GOALS: reduce leakage, manage prolapse, reduce frequency  PERTINENT HISTORY:   Hypertension; Myomectomy with abdominal approach; Abdominal Hysterctomy Sexual abuse: No  BOWEL MOVEMENT: Pain with bowel movement: No Type of bowel movement:Strain none Fully empty rectum: Yes: goes multiple times per day   URINATION: Pain with urination: No Fully empty bladder: Yes: due to having to go several times, stand up then has to urinate Stream: Strong Urgency: Yes , has to gt to the bathroom quickly Frequency: day-10 times; night- 2 times Leakage: Urge to void, Walking to the bathroom, and Coughing Pads: Yes: 1 per day for security  INTERCOURSE:  Ability to have vaginal penetration Yes  Pain with intercourse: none  PREGNANCY: Vaginal deliveries 2 Tearing Yes: 3rd degree tear  PROLAPSE: Pressure and Bulge   OBJECTIVE:  Note: Objective measures were completed at Evaluation unless otherwise noted.  DIAGNOSTIC FINDINGS:  none  PATIENT SURVEYS:  PFIQ-7: 33 UIQ-7 33  COGNITION: Overall cognitive status: Within functional limits for tasks assessed     SENSATION: Light touch:    FUNCTIONAL TESTS:  Single leg stance : right 3 sec, left 4 sec   POSTURE: rounded shoulders, forward head, increased thoracic kyphosis, and posterior pelvic tilt   LUMBARAROM/PROM:  A/PROM A/PROM  eval 11/23/23  Flexion full full  Extension Decreased by 25% full  Right lateral flexion Decreased by 25% Decreased by 25%  Left lateral flexion Decreased by 25% Decreased by 25%  Right rotation Decreased by 25% full  Left rotation Decreased by 25% full   (Blank rows = not tested)  LOWER EXTREMITY ROM:  Passive ROM Right eval Left eval Right/Left 12/21/23  Hip internal rotation 20 0 20   (Blank rows = not tested)  LOWER EXTREMITY MMT:  MMT Right eval Left eval Right 12/14/23 Left 12/14/23  Hip flexion 5/5 4/5 5/5 5/5  Hip extension 4/5 4/5 4/5 4/5  Hip abduction 4/5 4/5 4+/5 4+/5   (Blank rows = not tested) PALPATION:  Pelvic Alignment: ASIS are  equal  Abdominal: difficulty with contracting the abdominals                External Perineal Exam: scar along the perineal body is restricted                              Internal Pelvic Floor: tightness along the posterior vaginal canal  Patient confirms identification and approves PT to assess internal pelvic floor and treatment Yes No emotional/communication barriers or cognitive limitation. Patient is motivated to learn. Patient understands and agrees with treatment goals and plan. PT explains patient will be examined in standing, sitting, and lying down to see how their muscles and joints work. When they are ready, they will be asked to remove their underwear so PT can examine their perineum. The patient is also given the option of providing their own chaperone as one is not provided in our facility. The patient also has the  right and is explained the right to defer or refuse any part of the evaluation or treatment including the internal exam. With the patient's consent, PT will use one gloved finger to gently assess the muscles of the pelvic floor, seeing how well it contracts and relaxes and if there is muscle symmetry. After, the patient will get dressed and PT and patient will discuss exam findings and plan of care. PT and patient discuss plan of care, schedule, attendance policy and HEP activities.   PELVIC MMT:   MMT eval 10/09/23 11/30/23 12/07/23 02/01/24 02/10/24  Vaginal 1/5 with gluteal contraction 2/5 2/5 with slight hug of therapist;  2/5 2/5 2/5  (Blank rows = not tested)        TONE: Average tone of pelvic floor  PROLAPSE: Posterior wall weakness just above the introitus; anterior wall weakness just above the introitus  TODAY'S TREATMENT:  02/10/24 Manual: Soft tissue mobilization: Manual work to the ischiocavernosus, perineal body, superior transverse perineum, obturator internist to assess for dry needling and manual work to the muscles externally Internal pelvic floor  techniques: No emotional/communication barriers or cognitive limitation. Patient is motivated to learn. Patient understands and agrees with treatment goals and plan. PT explains patient will be examined in standing, sitting, and lying down to see how their muscles and joints work. When they are ready, they will be asked to remove their underwear so PT can examine their perineum. The patient is also given the option of providing their own chaperone as one is not provided in our facility. The patient also has the right and is explained the right to defer or refuse any part of the evaluation or treatment including the internal exam. With the patient's consent, PT will use one gloved finger to gently assess the muscles of the pelvic floor, seeing how well it contracts and relaxes and if there is muscle symmetry. After, the patient will get dressed and PT and patient will discuss exam findings and plan of care. PT and patient discuss plan of care, schedule, attendance policy and HEP activities.  Therapist gloved finger in the vaginal canal working on the levator ani, obturator internist, perineal body, and superior transverse perineum Dry needling: Trigger Point Dry Needling  Subsequent Treatment: Instructions reviewed, if requested by the patient, prior to subsequent dry needling treatment.   Patient Verbal Consent Given: Yes Education Handout Provided: Previously Provided Muscles Treated: ischiocavernosus, perineal body, superior transverse perineum, obturator internist Electrical Stimulation Performed: No Treatment Response/Outcome: elongation of muscle and trigger point restsponse    02/01/2024 Manual: Internal pelvic floor techniques: No emotional/communication barriers or cognitive limitation. Patient is motivated to learn. Patient understands and agrees with treatment goals and plan. PT explains patient will be examined in standing, sitting, and lying down to see how their muscles and joints work.  When they are ready, they will be asked to remove their underwear so PT can examine their perineum. The patient is also given the option of providing their own chaperone as one is not provided in our facility. The patient also has the right and is explained the right to defer or refuse any part of the evaluation or treatment including the internal exam. With the patient's consent, PT will use one gloved finger to gently assess the muscles of the pelvic floor, seeing how well it contracts and relaxes and if there is muscle symmetry. After, the patient will get dressed and PT and patient will discuss exam findings and plan of care. PT and patient discuss  plan of care, schedule, attendance policy and HEP activities.  Therapist gloved finger in the vaginal canal working on the levator ani and obturator internist to elongate the muscles Dry needling: Trigger Point Dry Needling  Initial Treatment: Pt instructed on Dry Needling rational, procedures, and possible side effects. Pt instructed to expect mild to moderate muscle soreness later in the day and/or into the next day.  Pt instructed in methods to reduce muscle soreness. Pt instructed to continue prescribed HEP. Because Dry Needling was performed over or adjacent to a lung field, pt was educated on S/S of pneumothorax and to seek immediate medical attention should they occur.  Patient was educated on signs and symptoms of infection and other risk factors and advised to seek medical attention should they occur.  Patient verbalized understanding of these instructions and education.   Patient Verbal Consent Given: Yes Education Handout Provided: Yes Muscles Treated: ischiocavernosus, perineal body Electrical Stimulation Performed: No Treatment Response/Outcome: elongation of muscle and trigger point restsponse  Neuromuscular re-education: Pelvic floor contraction training: Therapist gloved finger in the vaginal canal giving tactile and verbal cues to  contract the pelvic floor without adding the hip adductors and gluteals.  Self-care: Educated patient on dry needling and where we would dry needle and the effects we want to see    01/11/24 Manual: Soft tissue mobilization: Manual work along the perineal body, superior transverse perineum, along the ischiocavernosus, bulbocavernosus and urogenital diaphragm to lengthen the tissue and reduce urinary urgency Internal pelvic floor techniques: No emotional/communication barriers or cognitive limitation. Patient is motivated to learn. Patient understands and agrees with treatment goals and plan. PT explains patient will be examined in standing, sitting, and lying down to see how their muscles and joints work. When they are ready, they will be asked to remove their underwear so PT can examine their perineum. The patient is also given the option of providing their own chaperone as one is not provided in our facility. The patient also has the right and is explained the right to defer or refuse any part of the evaluation or treatment including the internal exam. With the patient's consent, PT will use one gloved finger to gently assess the muscles of the pelvic floor, seeing how well it contracts and relaxes and if there is muscle symmetry. After, the patient will get dressed and PT and patient will discuss exam findings and plan of care. PT and patient discuss plan of care, schedule, attendance policy and HEP activities.  Therapist gloved finger in the vaginal canal working on the left ischiocavernosus, bulbocavernosus, and levator ani to elongate the tissue and reduce the tightness      PATIENT EDUCATION:  10/09/23 Education details: Access Code:   Person educated: Patient Education method: Explanation, Demonstration, Actor cues, Verbal cues, and Handouts Education comprehension: verbalized understanding, returned demonstration, verbal cues required, tactile cues required, and needs further  education  HOME EXERCISE PROGRAM: 10/09/23 Access Code: [email or web address] URL: [email or web address] Date: 10/09/2023 Prepared by: Channing Pereyra  Exercises - - Supine Heel Slide with Small Ball  - 1 x daily - 7 x weekly - 3 sets - 10 reps - Supine Bridge with Mini Swiss Ball Between Knees  - 1 x daily - 1 x weekly - 1 sets - 10 reps - Sidelying Hip Adduction  - 1 x daily - 3 x weekly - 1 sets - 10 reps - Standing Hip Adduction  - 1 x daily - 3 x weekly - 1 sets - 10 reps -  Seated Abdominal Press into [employer]  - 1 x daily - 3 x weekly - 1 sets - 10 reps    ASSESSMENT:  CLINICAL IMPRESSION: Patient is a 64 y.o. female who was seen today for physical therapy  treatment for rectocele and urinary leakage. Patient responded with the dry needling well. She was able to feel less tightness in the pelvic floor and bilateral  hip after the dry needling.  She still needs tactile cues to contract the pelvic floor and not the other muscles. She is sleeping through the night several times now. She has had only one time of urinary leakage sincl last visit.  Patient will benefit from skilled therapy to improve pelvic floor strength and coordination to reduce leakage, urgency and manage her prolapse.   OBJECTIVE IMPAIRMENTS: decreased coordination, decreased ROM, decreased strength, increased fascial restrictions, and postural dysfunction.   ACTIVITY LIMITATIONS: continence and toileting  PARTICIPATION LIMITATIONS: cleaning, laundry, shopping, and community activity  PERSONAL FACTORS: Time since onset of injury/illness/exacerbation and 1-2 comorbidities: Hypertension; Myomectomy with abdominal approach; Abdominal Hysterctomy are also affecting patient's functional outcome.   REHAB POTENTIAL: Excellent  CLINICAL DECISION MAKING: Evolving/moderate complexity  EVALUATION COMPLEXITY: Moderate   GOALS: Goals reviewed with patient? Yes  SHORT TERM GOALS: Target date: 10/28/23  Patient is  independent with her initial HEP to improve continence.  Baseline: Goal status: Met 10/09/23  2.  Patient is able to perform diaphragmatic breathing to elongate the pelvic floor for urgency.  Baseline:  Goal status: Met 11/20/23  3.  Patient educated on the urge to void to reduce urgency when in pubic.  Baseline:  Goal status: Met 11/18/23  4.  Patient educated on what a prolapse is understand how to lay down with legs elevated to reduce prolapse during middle of day and after strenuous activity.  Baseline:  Goal status: Met 11/18/23   LONG TERM GOALS: Target date: 03/02/23  Patient independent with advanced HEP for core and pelvic floor to reduce urinary leakage and urgency.  Baseline:  Goal status: ongoing 12/30/23  2.  Patient educated on pressure management during daily activities to reduce strain on the prolapse and prevent further progression.  Baseline:  Goal status: Ongoing 11/23/23  3.  Patient is able to have the urge to urinate and slowly work to the bathroom without leaking urine due to using her urge suppression techniques.  Baseline:  Goal status: ongoing 11/23/23  4.  Patient is able to cough without leaking urine due to the ability to quickly contract the pelvic floor and pelvic floor strength is >/= 3/5.  Baseline:  Goal status: ongoing 11/23/23    PLAN:  PT FREQUENCY: 1-2x/week  PT DURATION: other: 5 months  PLANNED INTERVENTIONS: 97110-Therapeutic exercises, 97530- Therapeutic activity, 97112- Neuromuscular re-education, 97535- Self Care, [id number]- Manual therapy, [id number]- Electrical stimulation (unattended), 20560 (1-2 muscles), 20561 (3+ muscles)- Dry Needling, Patient/Family education, Joint mobilization, Spinal mobilization, Cryotherapy, Moist heat, and Biofeedback  PLAN FOR NEXT SESSION:  see if she needs  dry needling to the ischiocavernosus, bulbocavernosus, perineal body; work on left pelvic floor , contract of the pelvic floor; if doing well then  discharge.   Channing Pereyra, PT 02/10/2024 8:47 AM  "

## 2024-02-19 ENCOUNTER — Ambulatory Visit: Admitting: Physical Therapy

## 2024-02-19 ENCOUNTER — Encounter: Payer: Self-pay | Admitting: Physical Therapy

## 2024-02-19 DIAGNOSIS — R278 Other lack of coordination: Secondary | ICD-10-CM

## 2024-02-19 DIAGNOSIS — M6281 Muscle weakness (generalized): Secondary | ICD-10-CM

## 2024-02-19 NOTE — Therapy (Signed)
 " OUTPATIENT PHYSICAL THERAPY FEMALE PELVIC TREATMENT   Patient Name: Olivia Carr MRN: [id number] DOB:24-May-1960, 64 y.o., female Today's Date: 02/19/2024  END OF SESSION:  PT End of Session - 02/19/24 0802     Visit Number 16    Date for Recertification  03/01/24    Authorization Type [employer] ( DN signed 02/01/24)    PT Start Time 0800    PT Stop Time 0840    PT Time Calculation (min) 40 min    Activity Tolerance Patient tolerated treatment well    Behavior During Therapy WFL for tasks assessed/performed          Past Medical History:  Diagnosis Date   Allergic rhinitis    sees Dr. Frutoso    Asthma    sees Dr. Frutoso    Benign tumor of pineal gland (HCC)     last MRI 10-10-09, stable, no follow up planned   DUB (dysfunctional uterine bleeding)    GERD (gastroesophageal reflux disease)    History of bone density study 03-22-13   normal    Hypertension    NSVD (normal spontaneous vaginal delivery)    X2   Pneumonia    Routine gynecological examination    sees Dr. Curlee Guan    Past Surgical History:  Procedure Laterality Date   ABDOMINAL HYSTERECTOMY     LAVH   ANTERIOR CRUCIATE LIGAMENT REPAIR     right knee with medial and lateral meniscectomies 09/04/08   COLONOSCOPY  05/06/2021   per Dr. Julianne Gwen), benign polyps, repeat in 5 yrs   DILATION AND CURETTAGE OF UTERUS     x 2   EUSTACHIAN TUBE DILATION     KNEE ARTHROSCOPY Left 2018   LIGAMENT REPAIR Left 08/24/2019   Procedure: LIGAMENT REPAIR;  Surgeon: Cristy Bonner DASEN, MD;  Location: [facility];  Service: Orthopedics;  Laterality: Left;   MYOMECTOMY ABDOMINAL APPROACH     RADIAL HEAD ARTHROPLASTY Left 08/24/2019   Procedure: RADIAL HEAD ARTHROPLASTY;  Surgeon: Cristy Bonner DASEN, MD;  Location: [facility];  Service: Orthopedics;  Laterality: Left;   TONSILLECTOMY     WISDOM TOOTH EXTRACTION     Patient Active Problem List   Diagnosis Date Noted   Urinary  incontinence, mixed 09/25/2023   Pelvic organ prolapse quantification stage 2 rectocele 09/25/2023   Vaginal atrophy 09/25/2023   Incontinence of feces 09/25/2023   Nocturia 09/25/2023   History of recurrent UTI (urinary tract infection) 09/25/2023   COVID-19 virus infection 02/06/2020   SI (sacroiliac) pain 03/03/2014   Anxiety 01/19/2013   Perimenopause 01/19/2012   Acute upper respiratory infection 04/06/2010   Asthma with exacerbation 04/06/2010   BENIGN NEOPLASM OF PINEAL GLAND 09/24/2009   GASTROENTERITIS 07/13/2009   SORE THROAT 07/05/2009   FEVER UNSPECIFIED 07/05/2009   ASTHMA 01/22/2008   HYPERTENSION 10/12/2006   ALLERGIC RHINITIS 10/12/2006   GERD 10/08/2006   PNEUMONIA, HX OF 10/08/2006    PCP: Johnny garnette LABOR, MD  REFERRING PROVIDER: Guadlupe Lianne DASEN, MD   REFERRING DIAG:  N39.46 (ICD-10-CM) - Urinary incontinence, mixed  N81.6 (ICD-10-CM) - Pelvic organ prolapse quantification stage 2 rectocele  R15.9 (ICD-10-CM) - Incontinence of feces, unspecified fecal incontinence type    THERAPY DIAG:  Other lack of coordination  Muscle weakness (generalized)  Rationale for Evaluation and Treatment: Rehabilitation  ONSET DATE: 2025  SUBJECTIVE:  SUBJECTIVE STATEMENT: I have slept through the night . Urinary urgency several times per week. Isolated instances of urinary leakage.   Fluid intake: water, soda, coffee, juice  PAIN:  Are you having pain? No  PRECAUTIONS: None  RED FLAGS: None   WEIGHT BEARING RESTRICTIONS: No  FALLS:  Has patient fallen in last 6 months? No  OCCUPATION: retired  ACTIVITY LEVEL : gym 2 days per week for full body workout, walking 1-2 times per week  PLOF: Independent  PATIENT GOALS: reduce leakage, manage prolapse, reduce frequency  PERTINENT  HISTORY:  Hypertension; Myomectomy with abdominal approach; Abdominal Hysterctomy Sexual abuse: No  BOWEL MOVEMENT: Pain with bowel movement: No Type of bowel movement:Strain none Fully empty rectum: Yes: goes multiple times per day   URINATION: Pain with urination: No Fully empty bladder: Yes: due to having to go several times, stand up then has to urinate Stream: Strong Urgency: Yes , has to gt to the bathroom quickly Frequency: day-10 times; night- 2 times Leakage: Urge to void, Walking to the bathroom, and Coughing Pads: Yes: 1 per day for security  INTERCOURSE:  Ability to have vaginal penetration Yes  Pain with intercourse: none  PREGNANCY: Vaginal deliveries 2 Tearing Yes: 3rd degree tear  PROLAPSE: Pressure and Bulge   OBJECTIVE:  Note: Objective measures were completed at Evaluation unless otherwise noted.  DIAGNOSTIC FINDINGS:  none  PATIENT SURVEYS:  PFIQ-7: 33 UIQ-7 33  COGNITION: Overall cognitive status: Within functional limits for tasks assessed     SENSATION: Light touch:    FUNCTIONAL TESTS:  Single leg stance : right 3 sec, left 4 sec   POSTURE: rounded shoulders, forward head, increased thoracic kyphosis, and posterior pelvic tilt   LUMBARAROM/PROM:  A/PROM A/PROM  eval 11/23/23  Flexion full full  Extension Decreased by 25% full  Right lateral flexion Decreased by 25% Decreased by 25%  Left lateral flexion Decreased by 25% Decreased by 25%  Right rotation Decreased by 25% full  Left rotation Decreased by 25% full   (Blank rows = not tested)  LOWER EXTREMITY ROM:  Passive ROM Right eval Left eval Right/Left 12/21/23  Hip internal rotation 20 0 20   (Blank rows = not tested)  LOWER EXTREMITY MMT:  MMT Right eval Left eval Right 12/14/23 Left 12/14/23  Hip flexion 5/5 4/5 5/5 5/5  Hip extension 4/5 4/5 4/5 4/5  Hip abduction 4/5 4/5 4+/5 4+/5   (Blank rows = not tested) PALPATION:  Pelvic Alignment: ASIS are  equal  Abdominal: difficulty with contracting the abdominals                External Perineal Exam: scar along the perineal body is restricted                              Internal Pelvic Floor: tightness along the posterior vaginal canal  Patient confirms identification and approves PT to assess internal pelvic floor and treatment Yes No emotional/communication barriers or cognitive limitation. Patient is motivated to learn. Patient understands and agrees with treatment goals and plan. PT explains patient will be examined in standing, sitting, and lying down to see how their muscles and joints work. When they are ready, they will be asked to remove their underwear so PT can examine their perineum. The patient is also given the option of providing their own chaperone as one is not provided in our facility. The patient also has the right and  is explained the right to defer or refuse any part of the evaluation or treatment including the internal exam. With the patient's consent, PT will use one gloved finger to gently assess the muscles of the pelvic floor, seeing how well it contracts and relaxes and if there is muscle symmetry. After, the patient will get dressed and PT and patient will discuss exam findings and plan of care. PT and patient discuss plan of care, schedule, attendance policy and HEP activities.   PELVIC MMT:   MMT eval 10/09/23 11/30/23 12/07/23 02/01/24 02/10/24  Vaginal 1/5 with gluteal contraction 2/5 2/5 with slight hug of therapist;  2/5 2/5 2/5  (Blank rows = not tested)        TONE: Average tone of pelvic floor  PROLAPSE: Posterior wall weakness just above the introitus; anterior wall weakness just above the introitus  TODAY'S TREATMENT:  02/19/24 Manual: Soft tissue mobilization: Manual work to the ischiocavernosus, perineal body, superior transverse perineum, obturator internist; puborectalis, coccygeus to assess for dry needling and manual work to the muscles  externally Dry needling: Trigger Point Dry Needling  Subsequent Treatment: Instructions reviewed, if requested by the patient, prior to subsequent dry needling treatment.   Patient Verbal Consent Given: Yes Education Handout Provided: Previously Provided Muscles Treated: ischiocavernosus, perineal body, superior transverse perineum, obturator internist; puborectalis, coccygeus Electrical Stimulation Performed: No Treatment Response/Outcome: elongation of muscle and trigger point restsponse      02/10/24 Manual: Soft tissue mobilization: Manual work to the ischiocavernosus, perineal body, superior transverse perineum, obturator internist to assess for dry needling and manual work to the muscles externally Internal pelvic floor techniques: No emotional/communication barriers or cognitive limitation. Patient is motivated to learn. Patient understands and agrees with treatment goals and plan. PT explains patient will be examined in standing, sitting, and lying down to see how their muscles and joints work. When they are ready, they will be asked to remove their underwear so PT can examine their perineum. The patient is also given the option of providing their own chaperone as one is not provided in our facility. The patient also has the right and is explained the right to defer or refuse any part of the evaluation or treatment including the internal exam. With the patient's consent, PT will use one gloved finger to gently assess the muscles of the pelvic floor, seeing how well it contracts and relaxes and if there is muscle symmetry. After, the patient will get dressed and PT and patient will discuss exam findings and plan of care. PT and patient discuss plan of care, schedule, attendance policy and HEP activities.  Therapist gloved finger in the vaginal canal working on the levator ani, obturator internist, perineal body, and superior transverse perineum Dry needling: Trigger Point Dry  Needling  Subsequent Treatment: Instructions reviewed, if requested by the patient, prior to subsequent dry needling treatment.   Patient Verbal Consent Given: Yes Education Handout Provided: Previously Provided Muscles Treated: ischiocavernosus, perineal body, superior transverse perineum, obturator internist Electrical Stimulation Performed: No Treatment Response/Outcome: elongation of muscle and trigger point restsponse    02/01/2024 Manual: Internal pelvic floor techniques: No emotional/communication barriers or cognitive limitation. Patient is motivated to learn. Patient understands and agrees with treatment goals and plan. PT explains patient will be examined in standing, sitting, and lying down to see how their muscles and joints work. When they are ready, they will be asked to remove their underwear so PT can examine their perineum. The patient is also given the option  of providing their own chaperone as one is not provided in our facility. The patient also has the right and is explained the right to defer or refuse any part of the evaluation or treatment including the internal exam. With the patient's consent, PT will use one gloved finger to gently assess the muscles of the pelvic floor, seeing how well it contracts and relaxes and if there is muscle symmetry. After, the patient will get dressed and PT and patient will discuss exam findings and plan of care. PT and patient discuss plan of care, schedule, attendance policy and HEP activities.  Therapist gloved finger in the vaginal canal working on the levator ani and obturator internist to elongate the muscles Dry needling: Trigger Point Dry Needling  Initial Treatment: Pt instructed on Dry Needling rational, procedures, and possible side effects. Pt instructed to expect mild to moderate muscle soreness later in the day and/or into the next day.  Pt instructed in methods to reduce muscle soreness. Pt instructed to continue prescribed  HEP. Because Dry Needling was performed over or adjacent to a lung field, pt was educated on S/S of pneumothorax and to seek immediate medical attention should they occur.  Patient was educated on signs and symptoms of infection and other risk factors and advised to seek medical attention should they occur.  Patient verbalized understanding of these instructions and education.   Patient Verbal Consent Given: Yes Education Handout Provided: Yes Muscles Treated: ischiocavernosus, perineal body Electrical Stimulation Performed: No Treatment Response/Outcome: elongation of muscle and trigger point restsponse  Neuromuscular re-education: Pelvic floor contraction training: Therapist gloved finger in the vaginal canal giving tactile and verbal cues to contract the pelvic floor without adding the hip adductors and gluteals.  Self-care: Educated patient on dry needling and where we would dry needle and the effects we want to see     PATIENT EDUCATION:  10/09/23 Education details: Access Code:   Person educated: Patient Education method: Explanation, Demonstration, Actor cues, Verbal cues, and Handouts Education comprehension: verbalized understanding, returned demonstration, verbal cues required, tactile cues required, and needs further education  HOME EXERCISE PROGRAM: 10/09/23 Access Code: [email or web address] URL: [email or web address] Date: 10/09/2023 Prepared by: Channing Pereyra  Exercises - - Supine Heel Slide with Small Ball  - 1 x daily - 7 x weekly - 3 sets - 10 reps - Supine Bridge with Mini Swiss Ball Between Knees  - 1 x daily - 1 x weekly - 1 sets - 10 reps - Sidelying Hip Adduction  - 1 x daily - 3 x weekly - 1 sets - 10 reps - Standing Hip Adduction  - 1 x daily - 3 x weekly - 1 sets - 10 reps - Seated Abdominal Press into [employer]  - 1 x daily - 3 x weekly - 1 sets - 10 reps    ASSESSMENT:  CLINICAL IMPRESSION: Patient is a 64 y.o. female who was seen today for  physical therapy  treatment for rectocele and urinary leakage. Patient responded with the dry needling well. She was able to put her shoes on with greater ease. She was able to fully extend her spine after the dry needling. She was able to walk with greater ease due to increased in left hip mobility. She is having less urinary urgency and urinary leakage.   Patient will benefit from skilled therapy to improve pelvic floor strength and coordination to reduce leakage, urgency and manage her prolapse.   OBJECTIVE IMPAIRMENTS: decreased coordination, decreased ROM, decreased strength, increased  fascial restrictions, and postural dysfunction.   ACTIVITY LIMITATIONS: continence and toileting  PARTICIPATION LIMITATIONS: cleaning, laundry, shopping, and community activity  PERSONAL FACTORS: Time since onset of injury/illness/exacerbation and 1-2 comorbidities: Hypertension; Myomectomy with abdominal approach; Abdominal Hysterctomy are also affecting patient's functional outcome.   REHAB POTENTIAL: Excellent  CLINICAL DECISION MAKING: Evolving/moderate complexity  EVALUATION COMPLEXITY: Moderate   GOALS: Goals reviewed with patient? Yes  SHORT TERM GOALS: Target date: 10/28/23  Patient is independent with her initial HEP to improve continence.  Baseline: Goal status: Met 10/09/23  2.  Patient is able to perform diaphragmatic breathing to elongate the pelvic floor for urgency.  Baseline:  Goal status: Met 11/20/23  3.  Patient educated on the urge to void to reduce urgency when in pubic.  Baseline:  Goal status: Met 11/18/23  4.  Patient educated on what a prolapse is understand how to lay down with legs elevated to reduce prolapse during middle of day and after strenuous activity.  Baseline:  Goal status: Met 11/18/23   LONG TERM GOALS: Target date: 03/02/23  Patient independent with advanced HEP for core and pelvic floor to reduce urinary leakage and urgency.  Baseline:  Goal status:  ongoing 12/30/23  2.  Patient educated on pressure management during daily activities to reduce strain on the prolapse and prevent further progression.  Baseline:  Goal status: Ongoing 11/23/23  3.  Patient is able to have the urge to urinate and slowly work to the bathroom without leaking urine due to using her urge suppression techniques.  Baseline:  Goal status: ongoing 11/23/23  4.  Patient is able to cough without leaking urine due to the ability to quickly contract the pelvic floor and pelvic floor strength is >/= 3/5.  Baseline:  Goal status: ongoing 11/23/23    PLAN:  PT FREQUENCY: 1-2x/week  PT DURATION: other: 5 months  PLANNED INTERVENTIONS: 97110-Therapeutic exercises, 97530- Therapeutic activity, 97112- Neuromuscular re-education, 97535- Self Care, [id number]- Manual therapy, [id number]- Electrical stimulation (unattended), 20560 (1-2 muscles), 20561 (3+ muscles)- Dry Needling, Patient/Family education, Joint mobilization, Spinal mobilization, Cryotherapy, Moist heat, and Biofeedback  PLAN FOR NEXT SESSION:  see if she needs  dry needling to the ischiocavernosus, bulbocavernosus, perineal body; work on left pelvic floor , contract of the pelvic floor; if doing well then discharge.   Channing Pereyra, PT 02/19/24 8:54 AM  "

## 2024-02-24 ENCOUNTER — Encounter: Payer: Self-pay | Admitting: Physical Therapy

## 2024-02-24 ENCOUNTER — Ambulatory Visit: Admitting: Physical Therapy

## 2024-02-24 DIAGNOSIS — R278 Other lack of coordination: Secondary | ICD-10-CM

## 2024-02-24 DIAGNOSIS — M6281 Muscle weakness (generalized): Secondary | ICD-10-CM

## 2024-02-24 NOTE — Therapy (Signed)
 " OUTPATIENT PHYSICAL THERAPY FEMALE PELVIC TREATMENT   Patient Name: Olivia Carr MRN: [id number] DOB:1960/05/15, 64 y.o., female Today's Date: 02/24/2024  END OF SESSION:  PT End of Session - 02/24/24 0807     Visit Number 17    Date for Recertification  05/24/24    Authorization Type [employer] ( DN signed 02/01/24)    PT Start Time 0800    PT Stop Time 0845    PT Time Calculation (min) 45 min    Activity Tolerance Patient tolerated treatment well    Behavior During Therapy [facility], [place] for tasks assessed/performed          Past Medical History:  Diagnosis Date   Allergic rhinitis    sees Dr. Frutoso    Asthma    sees Dr. Frutoso    Benign tumor of pineal gland (HCC)     last MRI 10-10-09, stable, no follow up planned   DUB (dysfunctional uterine bleeding)    GERD (gastroesophageal reflux disease)    History of bone density study 03-22-13   normal    Hypertension    NSVD (normal spontaneous vaginal delivery)    X2   Pneumonia    Routine gynecological examination    sees Dr. Curlee Guan    Past Surgical History:  Procedure Laterality Date   ABDOMINAL HYSTERECTOMY     LAVH   ANTERIOR CRUCIATE LIGAMENT REPAIR     right knee with medial and lateral meniscectomies 09/04/08   COLONOSCOPY  05/06/2021   per Dr. Julianne Gwen), benign polyps, repeat in 5 yrs   DILATION AND CURETTAGE OF UTERUS     x 2   EUSTACHIAN TUBE DILATION     KNEE ARTHROSCOPY Left 2018   LIGAMENT REPAIR Left 08/24/2019   Procedure: LIGAMENT REPAIR;  Surgeon: Cristy Bonner DASEN, MD;  Location: [facility];  Service: Orthopedics;  Laterality: Left;   MYOMECTOMY ABDOMINAL APPROACH     RADIAL HEAD ARTHROPLASTY Left 08/24/2019   Procedure: RADIAL HEAD ARTHROPLASTY;  Surgeon: Cristy Bonner DASEN, MD;  Location: [facility];  Service: Orthopedics;  Laterality: Left;   TONSILLECTOMY     WISDOM TOOTH EXTRACTION     Patient Active Problem List   Diagnosis Date Noted   Urinary  incontinence, mixed 09/25/2023   Pelvic organ prolapse quantification stage 2 rectocele 09/25/2023   Vaginal atrophy 09/25/2023   Incontinence of feces 09/25/2023   Nocturia 09/25/2023   History of recurrent UTI (urinary tract infection) 09/25/2023   COVID-19 virus infection 02/06/2020   SI (sacroiliac) pain 03/03/2014   Anxiety 01/19/2013   Perimenopause 01/19/2012   Acute upper respiratory infection 04/06/2010   Asthma with exacerbation 04/06/2010   BENIGN NEOPLASM OF PINEAL GLAND 09/24/2009   GASTROENTERITIS 07/13/2009   SORE THROAT 07/05/2009   FEVER UNSPECIFIED 07/05/2009   ASTHMA 01/22/2008   HYPERTENSION 10/12/2006   ALLERGIC RHINITIS 10/12/2006   GERD 10/08/2006   PNEUMONIA, HX OF 10/08/2006    PCP: Johnny garnette LABOR, MD  REFERRING PROVIDER: Guadlupe Lianne DASEN, MD   REFERRING DIAG:  N39.46 (ICD-10-CM) - Urinary incontinence, mixed  N81.6 (ICD-10-CM) - Pelvic organ prolapse quantification stage 2 rectocele  R15.9 (ICD-10-CM) - Incontinence of feces, unspecified fecal incontinence type    THERAPY DIAG:  Other lack of coordination - Plan: PT plan of care cert/re-cert  Muscle weakness (generalized) - Plan: PT plan of care cert/re-cert  Rationale for Evaluation and Treatment: Rehabilitation  ONSET DATE: 2025  SUBJECTIVE:  SUBJECTIVE STATEMENT: I  had some tightness the day after the treatment. I did not get up at all last night.  Fluid intake: water, soda, coffee, juice  PAIN:  Are you having pain? No  PRECAUTIONS: None  RED FLAGS: None   WEIGHT BEARING RESTRICTIONS: No  FALLS:  Has patient fallen in last 6 months? No  OCCUPATION: retired  ACTIVITY LEVEL : gym 2 days per week for full body workout, walking 1-2 times per week  PLOF: Independent  PATIENT GOALS: reduce leakage,  manage prolapse, reduce frequency  PERTINENT HISTORY:  Hypertension; Myomectomy with abdominal approach; Abdominal Hysterctomy Sexual abuse: No  BOWEL MOVEMENT: Pain with bowel movement: No Type of bowel movement:Strain none Fully empty rectum: Yes: goes multiple times per day   URINATION: Pain with urination: No Fully empty bladder: Yes: due to having to go several times, stand up then has to urinate Stream: Strong Urgency: Yes , has to gt to the bathroom quickly Frequency: day-10 times; night- 2 times Leakage: Urge to void, Walking to the bathroom, and Coughing Pads: Yes: 1 per day for security  INTERCOURSE:  Ability to have vaginal penetration Yes  Pain with intercourse: none  PREGNANCY: Vaginal deliveries 2 Tearing Yes: 3rd degree tear  PROLAPSE: Pressure and Bulge   OBJECTIVE:  Note: Objective measures were completed at Evaluation unless otherwise noted.  DIAGNOSTIC FINDINGS:  none  PATIENT SURVEYS:  PFIQ-7: 33 UIQ-7 33  COGNITION: Overall cognitive status: Within functional limits for tasks assessed     SENSATION: Light touch:    FUNCTIONAL TESTS:  Single leg stance : right 3 sec, left 4 sec   POSTURE: rounded shoulders, forward head, increased thoracic kyphosis, and posterior pelvic tilt   LUMBARAROM/PROM:  A/PROM A/PROM  eval 11/23/23  Flexion full full  Extension Decreased by 25% full  Right lateral flexion Decreased by 25% Decreased by 25%  Left lateral flexion Decreased by 25% Decreased by 25%  Right rotation Decreased by 25% full  Left rotation Decreased by 25% full   (Blank rows = not tested)  LOWER EXTREMITY ROM:  Passive ROM Right eval Left eval Right/Left 12/21/23  Hip internal rotation 20 0 20   (Blank rows = not tested)  LOWER EXTREMITY MMT:  MMT Right eval Left eval Right 12/14/23 Left 12/14/23  Hip flexion 5/5 4/5 5/5 5/5  Hip extension 4/5 4/5 4/5 4/5  Hip abduction 4/5 4/5 4+/5 4+/5   (Blank rows = not  tested) PALPATION:  Pelvic Alignment: ASIS are equal  Abdominal: difficulty with contracting the abdominals                External Perineal Exam: scar along the perineal body is restricted                              Internal Pelvic Floor: tightness along the posterior vaginal canal  Patient confirms identification and approves PT to assess internal pelvic floor and treatment Yes No emotional/communication barriers or cognitive limitation. Patient is motivated to learn. Patient understands and agrees with treatment goals and plan. PT explains patient will be examined in standing, sitting, and lying down to see how their muscles and joints work. When they are ready, they will be asked to remove their underwear so PT can examine their perineum. The patient is also given the option of providing their own chaperone as one is not provided in our facility. The patient also has the right and  is explained the right to defer or refuse any part of the evaluation or treatment including the internal exam. With the patient's consent, PT will use one gloved finger to gently assess the muscles of the pelvic floor, seeing how well it contracts and relaxes and if there is muscle symmetry. After, the patient will get dressed and PT and patient will discuss exam findings and plan of care. PT and patient discuss plan of care, schedule, attendance policy and HEP activities.   PELVIC MMT:   MMT eval 10/09/23 11/30/23 12/07/23 02/01/24 02/10/24 02/24/24  Vaginal 1/5 with gluteal contraction 2/5 2/5 with slight hug of therapist;  2/5 2/5 2/5 2/5  (Blank rows = not tested)        TONE: Average tone of pelvic floor  PROLAPSE: Posterior wall weakness just above the introitus; anterior wall weakness just above the introitus  TODAY'S TREATMENT:  02/24/24 Manual: Soft tissue mobilization: Manual work to the ischiocavernosus, perineal body, superior transverse perineum, obturator internist; puborectalis, coccygeus to  assess for dry needling and manual work to the muscles externally Internal pelvic floor techniques: Trigger Point Dry Needling  Subsequent Treatment: Instructions reviewed, if requested by the patient, prior to subsequent dry needling treatment.   Patient Verbal Consent Given: Yes Education Handout Provided: Previously Provided Muscles Treated: ischiocavernosus, perineal body, superior transverse perineum, obturator internist; puborectalis, coccygeus Electrical Stimulation Performed: No Treatment Response/Outcome: elongation of muscle and trigger point restsponse Therapeutic activities: Functional strengthening activities: Discussed and reviewed with patient on how to keep the distance between the rib cage and pubic bone to reduce strain on pelvic floor. Hinging at the hips to elongate the gluteals and reduce the pressure on the pelvic floor    02/19/24 Manual: Soft tissue mobilization: Manual work to the ischiocavernosus, perineal body, superior transverse perineum, obturator internist; puborectalis, coccygeus to assess for dry needling and manual work to the muscles externally Dry needling: Trigger Point Dry Needling  Subsequent Treatment: Instructions reviewed, if requested by the patient, prior to subsequent dry needling treatment.   Patient Verbal Consent Given: Yes Education Handout Provided: Previously Provided Muscles Treated: ischiocavernosus, perineal body, superior transverse perineum, obturator internist; puborectalis, coccygeus Electrical Stimulation Performed: No Treatment Response/Outcome: elongation of muscle and trigger point restsponse      02/10/24 Manual: Soft tissue mobilization: Manual work to the ischiocavernosus, perineal body, superior transverse perineum, obturator internist to assess for dry needling and manual work to the muscles externally Internal pelvic floor techniques: No emotional/communication barriers or cognitive limitation. Patient is  motivated to learn. Patient understands and agrees with treatment goals and plan. PT explains patient will be examined in standing, sitting, and lying down to see how their muscles and joints work. When they are ready, they will be asked to remove their underwear so PT can examine their perineum. The patient is also given the option of providing their own chaperone as one is not provided in our facility. The patient also has the right and is explained the right to defer or refuse any part of the evaluation or treatment including the internal exam. With the patient's consent, PT will use one gloved finger to gently assess the muscles of the pelvic floor, seeing how well it contracts and relaxes and if there is muscle symmetry. After, the patient will get dressed and PT and patient will discuss exam findings and plan of care. PT and patient discuss plan of care, schedule, attendance policy and HEP activities.  Therapist gloved finger in the vaginal canal working on  the levator ani, obturator internist, perineal body, and superior transverse perineum Dry needling: Trigger Point Dry Needling  Subsequent Treatment: Instructions reviewed, if requested by the patient, prior to subsequent dry needling treatment.   Patient Verbal Consent Given: Yes Education Handout Provided: Previously Provided Muscles Treated: ischiocavernosus, perineal body, superior transverse perineum, obturator internist Electrical Stimulation Performed: No Treatment Response/Outcome: elongation of muscle and trigger point restsponse    02/01/2024 Manual: Internal pelvic floor techniques: No emotional/communication barriers or cognitive limitation. Patient is motivated to learn. Patient understands and agrees with treatment goals and plan. PT explains patient will be examined in standing, sitting, and lying down to see how their muscles and joints work. When they are ready, they will be asked to remove their underwear so PT can examine  their perineum. The patient is also given the option of providing their own chaperone as one is not provided in our facility. The patient also has the right and is explained the right to defer or refuse any part of the evaluation or treatment including the internal exam. With the patient's consent, PT will use one gloved finger to gently assess the muscles of the pelvic floor, seeing how well it contracts and relaxes and if there is muscle symmetry. After, the patient will get dressed and PT and patient will discuss exam findings and plan of care. PT and patient discuss plan of care, schedule, attendance policy and HEP activities.  Therapist gloved finger in the vaginal canal working on the levator ani and obturator internist to elongate the muscles Dry needling: Trigger Point Dry Needling  Initial Treatment: Pt instructed on Dry Needling rational, procedures, and possible side effects. Pt instructed to expect mild to moderate muscle soreness later in the day and/or into the next day.  Pt instructed in methods to reduce muscle soreness. Pt instructed to continue prescribed HEP. Because Dry Needling was performed over or adjacent to a lung field, pt was educated on S/S of pneumothorax and to seek immediate medical attention should they occur.  Patient was educated on signs and symptoms of infection and other risk factors and advised to seek medical attention should they occur.  Patient verbalized understanding of these instructions and education.   Patient Verbal Consent Given: Yes Education Handout Provided: Yes Muscles Treated: ischiocavernosus, perineal body Electrical Stimulation Performed: No Treatment Response/Outcome: elongation of muscle and trigger point restsponse  Neuromuscular re-education: Pelvic floor contraction training: Therapist gloved finger in the vaginal canal giving tactile and verbal cues to contract the pelvic floor without adding the hip adductors and gluteals.   Self-care: Educated patient on dry needling and where we would dry needle and the effects we want to see     PATIENT EDUCATION:  10/09/23 Education details: Access Code:   Person educated: Patient Education method: Explanation, Demonstration, Actor cues, Verbal cues, and Handouts Education comprehension: verbalized understanding, returned demonstration, verbal cues required, tactile cues required, and needs further education  HOME EXERCISE PROGRAM: 10/09/23 Access Code: [email or web address] URL: [email or web address] Date: 10/09/2023 Prepared by: Channing Pereyra  Exercises - - Supine Heel Slide with Small Ball  - 1 x daily - 7 x weekly - 3 sets - 10 reps - Supine Bridge with Mini Swiss Ball Between Knees  - 1 x daily - 1 x weekly - 1 sets - 10 reps - Sidelying Hip Adduction  - 1 x daily - 3 x weekly - 1 sets - 10 reps - Standing Hip Adduction  - 1 x daily - 3 x weekly -  1 sets - 10 reps - Seated Abdominal Press into [employer]  - 1 x daily - 3 x weekly - 1 sets - 10 reps    ASSESSMENT:  CLINICAL IMPRESSION: Patient is a 64 y.o. female who was seen today for physical therapy  treatment for rectocele and urinary leakage. Patient responded with the dry needling well. She had not had urinary leakage with coughing. Pelvic floor strength is 2/5. She understands pressure management with daily activities and with her workouts.  She is having less urinary urgency and urinary leakage.   Patient will benefit from skilled therapy to improve pelvic floor strength and coordination to reduce leakage, urgency and manage her prolapse.   OBJECTIVE IMPAIRMENTS: decreased coordination, decreased ROM, decreased strength, increased fascial restrictions, and postural dysfunction.   ACTIVITY LIMITATIONS: continence and toileting  PARTICIPATION LIMITATIONS: cleaning, laundry, shopping, and community activity  PERSONAL FACTORS: Time since onset of injury/illness/exacerbation and 1-2 comorbidities:  Hypertension; Myomectomy with abdominal approach; Abdominal Hysterctomy are also affecting patient's functional outcome.   REHAB POTENTIAL: Excellent  CLINICAL DECISION MAKING: Evolving/moderate complexity  EVALUATION COMPLEXITY: Moderate   GOALS: Goals reviewed with patient? Yes  SHORT TERM GOALS: Target date: 10/28/23  Patient is independent with her initial HEP to improve continence.  Baseline: Goal status: Met 10/09/23  2.  Patient is able to perform diaphragmatic breathing to elongate the pelvic floor for urgency.  Baseline:  Goal status: Met 11/20/23  3.  Patient educated on the urge to void to reduce urgency when in pubic.  Baseline:  Goal status: Met 11/18/23  4.  Patient educated on what a prolapse is understand how to lay down with legs elevated to reduce prolapse during middle of day and after strenuous activity.  Baseline:  Goal status: Met 11/18/23   LONG TERM GOALS: Target date: 03/02/23; new date is 02/24/24  Patient independent with advanced HEP for core and pelvic floor to reduce urinary leakage and urgency.  Baseline:  Goal status: ongoing 02/24/24  2.  Patient educated on pressure management during daily activities to reduce strain on the prolapse and prevent further progression.  Baseline:  Goal status: Met 02/24/24  3.  Patient is able to have the urge to urinate and slowly work to the bathroom without leaking urine due to using her urge suppression techniques.  Baseline:  Goal status: ongoing 02/24/24  4.  Patient is able to cough without leaking urine due to the ability to quickly contract the pelvic floor and pelvic floor strength is >/= 3/5.  Baseline:  Goal status: ongoing 02/24/24   PLAN:  PT FREQUENCY: 1-2x/week  PT DURATION: other: 5 months  PLANNED INTERVENTIONS: 97110-Therapeutic exercises, 97530- Therapeutic activity, 97112- Neuromuscular re-education, 97535- Self Care, [id number]- Manual therapy, [id number]- Electrical stimulation (unattended),  20560 (1-2 muscles), 20561 (3+ muscles)- Dry Needling, Patient/Family education, Joint mobilization, Spinal mobilization, Cryotherapy, Moist heat, and Biofeedback  PLAN FOR NEXT SESSION:  see if she needs  dry needling to the ischiocavernosus, bulbocavernosus, perineal body; work on left pelvic floor , contract of the pelvic floor   Channing Pereyra, PT 02/24/24 9:00 AM  "

## 2024-03-28 ENCOUNTER — Ambulatory Visit: Admitting: Obstetrics

## 2024-03-28 ENCOUNTER — Ambulatory Visit: Admitting: Physical Therapy

## 2024-04-06 ENCOUNTER — Ambulatory Visit: Admitting: Obstetrics

## 2024-05-25 ENCOUNTER — Ambulatory Visit: Admitting: Obstetrics and Gynecology
